# Patient Record
Sex: Female | Born: 1945 | Race: White | Hispanic: No | Marital: Married | Smoking: Never smoker
Health system: Southern US, Community
[De-identification: ages and names within clinical notes are randomized; demographics above are authoritative.]

## PROBLEM LIST (undated history)

## (undated) DIAGNOSIS — K754 Autoimmune hepatitis: Secondary | ICD-10-CM

## (undated) DIAGNOSIS — T7840XA Allergy, unspecified, initial encounter: Secondary | ICD-10-CM

## (undated) DIAGNOSIS — I422 Other hypertrophic cardiomyopathy: Secondary | ICD-10-CM

## (undated) DIAGNOSIS — J45909 Unspecified asthma, uncomplicated: Secondary | ICD-10-CM

## (undated) DIAGNOSIS — M1990 Unspecified osteoarthritis, unspecified site: Secondary | ICD-10-CM

## (undated) DIAGNOSIS — M8580 Other specified disorders of bone density and structure, unspecified site: Secondary | ICD-10-CM

## (undated) DIAGNOSIS — R5383 Other fatigue: Secondary | ICD-10-CM

## (undated) DIAGNOSIS — D329 Benign neoplasm of meninges, unspecified: Secondary | ICD-10-CM

## (undated) DIAGNOSIS — G43B0 Ophthalmoplegic migraine, not intractable: Secondary | ICD-10-CM

## (undated) DIAGNOSIS — I10 Essential (primary) hypertension: Secondary | ICD-10-CM

## (undated) HISTORY — DX: Unspecified asthma, uncomplicated: J45.909

## (undated) HISTORY — DX: Other specified disorders of bone density and structure, unspecified site: M85.80

## (undated) HISTORY — DX: Autoimmune hepatitis: K75.4

## (undated) HISTORY — DX: Benign neoplasm of meninges, unspecified: D32.9

## (undated) HISTORY — PX: CHOLECYSTECTOMY: SHX55

## (undated) HISTORY — DX: Unspecified osteoarthritis, unspecified site: M19.90

## (undated) HISTORY — DX: Other fatigue: R53.83

## (undated) HISTORY — PX: FRACTURE SURGERY: SHX138

## (undated) HISTORY — DX: Essential (primary) hypertension: I10

## (undated) HISTORY — PX: WRIST SURGERY: SHX841

## (undated) HISTORY — PX: ANKLE SURGERY: SHX546

## (undated) HISTORY — DX: Allergy, unspecified, initial encounter: T78.40XA

## (undated) HISTORY — DX: Ophthalmoplegic migraine, not intractable: G43.B0

## (undated) HISTORY — PX: BRAIN SURGERY: SHX531

## (undated) HISTORY — DX: Other hypertrophic cardiomyopathy: I42.2

---

## 2001-11-03 ENCOUNTER — Emergency Department (HOSPITAL_COMMUNITY): Admission: EM | Admit: 2001-11-03 | Discharge: 2001-11-03 | Payer: Self-pay | Admitting: Emergency Medicine

## 2001-11-03 ENCOUNTER — Encounter: Payer: Self-pay | Admitting: Emergency Medicine

## 2003-08-09 ENCOUNTER — Encounter: Admission: RE | Admit: 2003-08-09 | Discharge: 2003-08-09 | Payer: Self-pay | Admitting: Family Medicine

## 2005-02-13 ENCOUNTER — Ambulatory Visit: Payer: Self-pay | Admitting: Cardiology

## 2006-01-19 ENCOUNTER — Ambulatory Visit: Payer: Self-pay | Admitting: Cardiology

## 2006-09-09 ENCOUNTER — Encounter: Admission: RE | Admit: 2006-09-09 | Discharge: 2006-09-09 | Payer: Self-pay | Admitting: Otolaryngology

## 2008-02-17 ENCOUNTER — Ambulatory Visit: Payer: Self-pay | Admitting: Cardiology

## 2008-02-28 ENCOUNTER — Ambulatory Visit: Payer: Self-pay

## 2008-02-28 ENCOUNTER — Encounter: Payer: Self-pay | Admitting: Cardiology

## 2008-10-13 ENCOUNTER — Encounter: Payer: Self-pay | Admitting: Cardiology

## 2009-08-28 ENCOUNTER — Encounter: Payer: Self-pay | Admitting: Cardiology

## 2009-11-19 LAB — HM COLONOSCOPY

## 2010-02-20 ENCOUNTER — Ambulatory Visit: Payer: Self-pay | Admitting: Cardiology

## 2010-03-26 LAB — HM PAP SMEAR: HM Pap smear: NORMAL

## 2010-03-28 ENCOUNTER — Encounter
Admission: RE | Admit: 2010-03-28 | Discharge: 2010-03-28 | Payer: Self-pay | Source: Home / Self Care | Admitting: Sports Medicine

## 2010-05-21 NOTE — Assessment & Plan Note (Signed)
Summary: PER CHECK OUT/SF  Medications Added IPRATROPIUM BROMIDE 0.03 % SOLN (IPRATROPIUM BROMIDE) as directed ASTELIN 137 MCG/SPRAY SOLN (AZELASTINE HCL) as directed ASPIRIN 325 MG  TABS (ASPIRIN) 1 by mouth daily FOSAMAX 70 MG TABS (ALENDRONATE SODIUM) 1 by mouth weekly AZATHIOPRINE 50 MG TABS (AZATHIOPRINE) 2 by mouth daily ADVAIR DISKUS 100-50 MCG/DOSE AEPB (FLUTICASONE-SALMETEROL) UAD VITAMIN D 1000 UNIT TABS (CHOLECALCIFEROL) 1 by mouth daily ZETIA 10 MG TABS (EZETIMIBE) 1 by mouth daily SINGULAIR 10 MG TABS (MONTELUKAST SODIUM) 1 by mouth daily PRAVACHOL 40 MG TABS (PRAVASTATIN SODIUM) 1 by mouth daily CALCI-CHEW 1250 MG CHEW (CALCIUM CARBONATE) 2 by mouth daily      Allergies Added: ! PENICILLIN ! KEFLEX  Visit Type:  Follow-up Primary Provider:  Dr. Christell Constant  CC:  Hypertrophic CM.  History of Present Illness: The patient presents for 24 month followup of hypertrophic cardiomyopathy. This was noted in the past when she was sick and being diagnosed with hepatitis. The last echo in 2009 demonstrated no significant septal thickening though there was some mild systolic anterior motion of the mitral leaflet and nodularity of the papillary muscle or cords. Since that evaluation she's had no new cardiovascular symptoms. In particular she said no shortness of breath, PND or orthopnea. He has no palpitations, presyncope or syncope. She denies any chest pressure, neck or arm discomfort.  Current Medications (verified): 1)  Verapamil Hcl Cr 240 Mg Xr24h-Cap (Verapamil Hcl) .Marland Kitchen.. 1 By Mouth Daily 2)  Hydrochlorothiazide 25 Mg Tabs (Hydrochlorothiazide) .Marland Kitchen.. 1 By Mouth Daily 3)  Ipratropium Bromide 0.03 % Soln (Ipratropium Bromide) .... As Directed 4)  Astelin 137 Mcg/spray Soln (Azelastine Hcl) .... As Directed 5)  Aspirin 325 Mg  Tabs (Aspirin) .Marland Kitchen.. 1 By Mouth Daily 6)  Fosamax 70 Mg Tabs (Alendronate Sodium) .Marland Kitchen.. 1 By Mouth Weekly 7)  Azathioprine 50 Mg Tabs (Azathioprine) .... 2 By  Mouth Daily 8)  Advair Diskus 100-50 Mcg/dose Aepb (Fluticasone-Salmeterol) .... Uad 9)  Vitamin D 1000 Unit Tabs (Cholecalciferol) .Marland Kitchen.. 1 By Mouth Daily 10)  Zetia 10 Mg Tabs (Ezetimibe) .Marland Kitchen.. 1 By Mouth Daily 11)  Singulair 10 Mg Tabs (Montelukast Sodium) .Marland Kitchen.. 1 By Mouth Daily 12)  Pravachol 40 Mg Tabs (Pravastatin Sodium) .Marland Kitchen.. 1 By Mouth Daily 13)  Calci-Chew 1250 Mg Chew (Calcium Carbonate) .... 2 By Mouth Daily  Allergies (verified): 1)  ! Penicillin 2)  ! Keflex  Past History:  Past Medical History: Hypertrophic cardiomyopathy (echocardiogram 2009 with hyperdynamic LV.  The LV outflow tract gradient was 4.8 meters per second at rest.  There was systolic anterior motion of the mitral leaflet and mild MR), autoimmune hepatitis, asthma, hypertension, cholecystectomy, wrist surgery, ankle surgery, resection of meningioma 2009.   Review of Systems       As stated in the HPI and negative for all other systems.   Vital Signs:  Patient profile:   65 year old female Height:      67 inches Weight:      182 pounds BMI:     28.61 Pulse rate:   63 / minute Resp:     16 per minute BP sitting:   108 / 70  (right arm)  Vitals Entered By: Marrion Coy, CNA (February 20, 2010 9:29 AM)  Physical Exam  General:  Well developed, well nourished, in no acute distress. Head:  normocephalic and atraumatic Neck:  Neck supple, no JVD. No masses, thyromegaly or abnormal cervical nodes. Chest Wall:  no deformities or breast masses noted Lungs:  Clear bilaterally to auscultation and percussion. Heart:  Non-displaced PMI, chest non-tender; regular rate and rhythm, S1, S2 without murmurs, rubs or gallops. Carotid upstroke normal, no bruit. Normal abdominal aortic size, no bruits. Femorals normal pulses, no bruits. Pedals normal pulses. No edema, no varicosities. Abdomen:  Bowel sounds positive; abdomen soft and non-tender without masses, organomegaly, or hernias noted. No  hepatosplenomegaly. Msk:  Back normal, normal gait. Muscle strength and tone normal. Extremities:  No clubbing or cyanosis. Neurologic:  Alert and oriented x 3. Skin:  Intact without lesions or rashes. Cervical Nodes:  no significant adenopathy Psych:  Normal affect.   EKG  Procedure date:  02/20/2010  Findings:      Sinus rhythm, rate 63, nonspecific ST depression  Impression & Recommendations:  Problem # 1:  HYPERTROPHIC OBSTRUCTIVE CARDIOMYOPATHY (ICD-425.1) At this point there is no clinical evidence that this has progressed. No further cardiovascular testing is indicated.  She can follow as needed. Orders: EKG w/ Interpretation (93000)  Problem # 2:  HTN She will continue the Verapamil .  Patient Instructions: 1)  Your physician recommends that you schedule a follow-up appointment as needed 2)  Your physician recommends that you continue on your current medications as directed. Please refer to the Current Medication list given to you today. Prescriptions: VERAPAMIL HCL CR 240 MG XR24H-CAP (VERAPAMIL HCL) 1 by mouth daily  #30 x 11   Entered by:   Charolotte Capuchin, RN   Authorized by:   Rollene Rotunda, MD, [facility]   Signed by:   Charolotte Capuchin, RN on 02/20/2010   Method used:   Electronically to        [facility]. [address] [phone]* (retail)       [address] [place], [place]  [id number]       Ph: [phone]       Fax: [phone]   RxID:   [id number]

## 2010-07-06 ENCOUNTER — Encounter: Payer: Self-pay | Admitting: Family Medicine

## 2010-07-06 DIAGNOSIS — R5383 Other fatigue: Secondary | ICD-10-CM

## 2010-07-06 DIAGNOSIS — M8580 Other specified disorders of bone density and structure, unspecified site: Secondary | ICD-10-CM

## 2010-07-06 DIAGNOSIS — G43109 Migraine with aura, not intractable, without status migrainosus: Secondary | ICD-10-CM

## 2010-07-06 DIAGNOSIS — J302 Other seasonal allergic rhinitis: Secondary | ICD-10-CM

## 2010-07-06 DIAGNOSIS — Z9049 Acquired absence of other specified parts of digestive tract: Secondary | ICD-10-CM

## 2010-07-06 DIAGNOSIS — K754 Autoimmune hepatitis: Secondary | ICD-10-CM | POA: Insufficient documentation

## 2010-07-06 DIAGNOSIS — D329 Benign neoplasm of meninges, unspecified: Secondary | ICD-10-CM | POA: Insufficient documentation

## 2010-07-06 DIAGNOSIS — I10 Essential (primary) hypertension: Secondary | ICD-10-CM | POA: Insufficient documentation

## 2010-07-06 DIAGNOSIS — J45909 Unspecified asthma, uncomplicated: Secondary | ICD-10-CM | POA: Insufficient documentation

## 2010-08-12 ENCOUNTER — Other Ambulatory Visit: Payer: Self-pay | Admitting: Cardiology

## 2010-09-03 NOTE — Assessment & Plan Note (Signed)
[facility]                            CARDIOLOGY OFFICE NOTE   Veronica Flores, Veronica Flores                       MRN:          [id number]  DATE:02/17/2008                            DOB:          1946-04-06    PRIMARY CARE PHYSICIAN:  [facility]   REASON FOR PRESENTATION:  Evaluate the patient with left ventricular  hypertrophy and mild hypertrophic cardiomyopathy.   HISTORY OF PRESENT ILLNESS:  The patient is now 65 years old.  It has  been 2 years since I last saw her.  She has had no new cardiovascular  symptoms since I last saw her.  She did have diagnosis of meningioma and  actually had to have this resected.  She is recovering from this  surgery.  She actually had no cardiovascular problems during this  therapy.  She continues to have some blurred vision, is actually wearing  an eye patch a day.  Because of all of this, she has not been  particularly active over the last several months.  However, with her  chores of daily living, she denies any chest discomfort, neck or arm  discomfort.  She is not having any shortness of breath, PND, or  orthopnea.  She has not had any palpitation, presyncope, or syncope.   PAST MEDICAL HISTORY:  Hypertrophic cardiomyopathy (echocardiogram 2005  with hyperdynamic LV.  The LV outflow tract gradient was 4.8 meters per  second at rest.  There was systolic anterior motion of the mitral  leaflet and mild MR), autoimmune hepatitis, asthma, hypertension,  cholecystectomy, wrist surgery, ankle surgery, resection of meningioma  2009.   ALLERGIES:  PENICILLIN, KEFLEX.   MEDICATIONS:  1. Ipratropium spray.  2. Advair.  3. Nasonex.  4. Fosamax.  5. Aspirin 81 mg daily.  6. Verapamil 240 mg daily.  7. Hydrochlorothiazide 25 mg daily.  8. Singulair 10 mg daily.  9. Zetia 10 mg daily.  10.Pravastatin 40 mg daily.  11.Vitamin D.   REVIEW OF SYSTEMS:  As stated in the HPI and otherwise negative for  other systems.   PHYSICAL EXAMINATION:  GENERAL:  The patient is pleasant in no distress.  VITAL SIGNS:  Blood pressure 134/76, heart rate 65 and regular, weight  188 pounds, body mass index 30.  HEENT:  Eyes unremarkable.  Pupils equal, round, and reactive to light.  Fundi not visualized.  Oral mucosa unremarkable.  NECK:  No jugular venous distention at 45 degrees.  Carotid upstroke  brisk and symmetrical.  No bruits.  No thyromegaly.  LYMPHATICS:  No  cervical, axillary, or inguinal adenopathy.  LUNGS:  Clear to auscultation bilaterally.  BACK:  No costovertebral angle tenderness.  CHEST:  Unremarkable.  HEART:  PMI not displaced or sustained.  S1, S2 within normal limits.  No S3, no S4.  No clicks, no rubs.  2/6 apical systolic murmur radiating  slightly at the aortic outflow tract, increasing slightly with strain  phase of Valsalva, no diastolic murmurs heard.  ABDOMEN:  Flat, positive  bowel sounds normal in frequency and pitch.  No bruits, no rebound, no  guarding.  No midline pulsatile mass.  No organomegaly.  SKIN:  No rashes, no nodules.  EXTREMITIES:  2+ pulse, no edema.   1. Hypertrophic cardiomyopathy.  I do not suspect that this has      increased substantially.  However, it has been 5 years since the      last echocardiogram.  She did have some systolic anterior motion of      the mitral leaflet and regurgitation.  She needs further evaluation      of this.  In the meantime, she will continue with her medications      as listed.  We discussed the symptoms that could develop should she      have any worsening of this.  2. Followup.  I would like to see her back in 2 years unless she has      further problems.     Rollene Rotunda, MD, [facility]  Electronically Signed    JH/MedQ  DD: 02/17/2008  DT: 02/17/2008  Job #: [id number]   cc:   [facility]

## 2010-09-06 NOTE — Assessment & Plan Note (Signed)
[facility]                              CARDIOLOGY OFFICE NOTE   Veronica Flores, Veronica Flores                       MRN:          [id number]  DATE:01/19/2006                            DOB:          1945-07-22    PRIMARY:  Dr. Vernon Prey at [facility].   REASON FOR PRESENTATION:  Evaluate the patient for left ventricular  hypertrophy and mild hypertrophic cardiomyopathy.   HISTORY OF PRESENT ILLNESS:  The patient is now 65 years old.  She returns  for yearly followup.  She has done quite well.  She has no shortness of  breath other than occasional mild bouts of asthma.  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  She has no PND or orthopnea.  She has  no palpitations, pre-syncope, or syncope.  She has no chest pain.   PAST MEDICAL HISTORY:  Autoimmune hepatitis, mild hypertrophic  cardiomyopathy, asthma, hypertension, cholecystectomy, wrist surgery, ankle  surgery.   ALLERGIES:  PENICILLIN and KEFLEX.   MEDICATIONS:  Ipratropium bromide, Nasonex, Advair, Singulair, Albuterol,  hydrochlorothiazide 25 mg q. day, calcium, verapamil 240 mg, __________ ,  Actonel, aspirin 81 mg, Zetia 10 mg a day, pravastatin 20 mg a day.   REVIEW OF SYSTEMS:  As stated in the HPI and otherwise negative for other  symptoms.   PHYSICAL EXAMINATION:  The patient is in no distress.  Weight 196 pounds, body mass index 31, heart rate 52, blood pressure 138/74.  LUNGS:  Clear to auscultation bilaterally.  BACK:  No costovertebral angle tenderness.  CHEST:  Unremarkable.  HEART:  PMI not displaced or sustained.  S1 and S2 within normal limits.  No  S3.  No S4.  No murmurs appreciated today.  No diastolic murmurs.  ABDOMEN:  Flat, positive bowel sounds, normal frequency and pitch.  No  bruits, rebound, or guarding.  No midline pulsatile mass or organomegaly.  SKIN:  No rashes,, benign.  EXTREMITIES:  With 2+ pulses.  No edema.   ASSESSMENT AND PLAN:  1.  Hypertrophic cardiomyopathy/hypertensive left ventricular hypertrophy.      The patient is having no symptoms related to this.  She understands      salt and fluid restriction and blood pressure control.  No further      cardiovascular testing is suggested.  2. Obesity.  She understands the need to lose weight with diet and      exercise.  3. Followup.  I will see her back in about 2 years or sooner if needed.            ______________________________  Rollene Rotunda, MD, [facility]     JH/MedQ  DD:  01/19/2006  DT:  01/19/2006  Job #:  [id number]   cc:   Ernestina Penna, M.D.

## 2010-10-24 ENCOUNTER — Other Ambulatory Visit: Payer: Self-pay | Admitting: Cardiology

## 2010-10-29 ENCOUNTER — Encounter: Payer: Self-pay | Admitting: Family Medicine

## 2010-11-17 ENCOUNTER — Inpatient Hospital Stay (INDEPENDENT_AMBULATORY_CARE_PROVIDER_SITE_OTHER)
Admission: RE | Admit: 2010-11-17 | Discharge: 2010-11-17 | Disposition: A | Discharge status: Home / Self Care | Payer: BC Managed Care – PPO | Source: Ambulatory Visit | Attending: Family Medicine | Admitting: Family Medicine

## 2010-11-17 ENCOUNTER — Inpatient Hospital Stay (HOSPITAL_COMMUNITY)
Admission: EM | Admit: 2010-11-17 | Discharge: 2010-11-20 | DRG: 320 | Disposition: A | Discharge status: Home / Self Care | Payer: BC Managed Care – PPO | Attending: Internal Medicine | Admitting: Internal Medicine

## 2010-11-17 ENCOUNTER — Emergency Department (HOSPITAL_COMMUNITY): Payer: BC Managed Care – PPO

## 2010-11-17 DIAGNOSIS — N390 Urinary tract infection, site not specified: Principal | ICD-10-CM | POA: Diagnosis present

## 2010-11-17 DIAGNOSIS — Z7982 Long term (current) use of aspirin: Secondary | ICD-10-CM

## 2010-11-17 DIAGNOSIS — I10 Essential (primary) hypertension: Secondary | ICD-10-CM | POA: Diagnosis present

## 2010-11-17 DIAGNOSIS — I959 Hypotension, unspecified: Secondary | ICD-10-CM | POA: Diagnosis present

## 2010-11-17 DIAGNOSIS — A498 Other bacterial infections of unspecified site: Secondary | ICD-10-CM | POA: Diagnosis present

## 2010-11-17 DIAGNOSIS — E785 Hyperlipidemia, unspecified: Secondary | ICD-10-CM | POA: Diagnosis present

## 2010-11-17 DIAGNOSIS — K754 Autoimmune hepatitis: Secondary | ICD-10-CM | POA: Diagnosis present

## 2010-11-17 DIAGNOSIS — J45909 Unspecified asthma, uncomplicated: Secondary | ICD-10-CM | POA: Diagnosis present

## 2010-11-17 DIAGNOSIS — R079 Chest pain, unspecified: Secondary | ICD-10-CM

## 2010-11-17 LAB — URINALYSIS, ROUTINE W REFLEX MICROSCOPIC
Glucose, UA: NEGATIVE mg/dL
Hgb urine dipstick: NEGATIVE
Protein, ur: 30 mg/dL — AB
pH: 5.5 (ref 5.0–8.0)

## 2010-11-17 LAB — CBC
HCT: 41.3 % (ref 36.0–46.0)
MCHC: 35.4 g/dL (ref 30.0–36.0)
Platelets: 227 10*3/uL (ref 150–400)
RDW: 14.3 % (ref 11.5–15.5)

## 2010-11-17 LAB — COMPREHENSIVE METABOLIC PANEL
AST: 25 U/L (ref 0–37)
Albumin: 3.6 g/dL (ref 3.5–5.2)
CO2: 22 mEq/L (ref 19–32)
Creatinine, Ser: 0.65 mg/dL (ref 0.50–1.10)
Glucose, Bld: 104 mg/dL — ABNORMAL HIGH (ref 70–99)
Sodium: 136 mEq/L (ref 135–145)
Total Bilirubin: 0.7 mg/dL (ref 0.3–1.2)
Total Protein: 6.6 g/dL (ref 6.0–8.3)

## 2010-11-17 LAB — DIFFERENTIAL
Basophils Absolute: 0.1 10*3/uL (ref 0.0–0.1)
Eosinophils Absolute: 0 10*3/uL (ref 0.0–0.7)
Lymphs Abs: 0.6 10*3/uL — ABNORMAL LOW (ref 0.7–4.0)
Monocytes Absolute: 0.5 10*3/uL (ref 0.1–1.0)

## 2010-11-17 LAB — PROTIME-INR
INR: 0.98 (ref 0.00–1.49)
Prothrombin Time: 13.2 seconds (ref 11.6–15.2)

## 2010-11-17 LAB — URINE MICROSCOPIC-ADD ON

## 2010-11-17 LAB — LACTIC ACID, PLASMA: Lactic Acid, Venous: 1.5 mmol/L (ref 0.5–2.2)

## 2010-11-17 LAB — CK TOTAL AND CKMB (NOT AT ARMC): Total CK: 47 U/L (ref 7–177)

## 2010-11-17 LAB — APTT: aPTT: 31 seconds (ref 24–37)

## 2010-11-18 LAB — CBC
HCT: 37.3 % (ref 36.0–46.0)
Hemoglobin: 12.3 g/dL (ref 12.0–15.0)
MCH: 27.6 pg (ref 26.0–34.0)
MCHC: 33 g/dL (ref 30.0–36.0)
MCV: 83.6 fL (ref 78.0–100.0)
RDW: 14.4 % (ref 11.5–15.5)

## 2010-11-18 LAB — COMPREHENSIVE METABOLIC PANEL
ALT: 10 U/L (ref 0–35)
Alkaline Phosphatase: 40 U/L (ref 39–117)
BUN: 7 mg/dL (ref 6–23)
CO2: 24 mEq/L (ref 19–32)
Chloride: 112 mEq/L (ref 96–112)
GFR calc Af Amer: >60 mL/min (ref >60)
Glucose, Bld: 88 mg/dL (ref 70–99)
Potassium: 3.7 mEq/L (ref 3.5–5.1)
Sodium: 142 mEq/L (ref 135–145)
Total Bilirubin: 0.3 mg/dL (ref 0.3–1.2)
Total Protein: 5.5 g/dL — ABNORMAL LOW (ref 6.0–8.3)

## 2010-11-18 LAB — TSH: TSH: 3.344 u[IU]/mL (ref 0.350–4.500)

## 2010-11-18 LAB — CARDIAC PANEL(CRET KIN+CKTOT+MB+TROPI)
CK, MB: 1.4 ng/mL (ref 0.3–4.0)
CK, MB: 1.5 ng/mL (ref 0.3–4.0)
Total CK: 46 U/L (ref 7–177)

## 2010-11-19 LAB — BASIC METABOLIC PANEL
Chloride: 108 mEq/L (ref 96–112)
GFR calc Af Amer: >60 mL/min (ref >60)
GFR calc non Af Amer: >60 mL/min (ref >60)
Potassium: 4 mEq/L (ref 3.5–5.1)
Sodium: 140 mEq/L (ref 135–145)

## 2010-11-19 LAB — CBC
Hemoglobin: 12.4 g/dL (ref 12.0–15.0)
MCHC: 33.3 g/dL (ref 30.0–36.0)
RDW: 14.8 % (ref 11.5–15.5)
WBC: 6.2 10*3/uL (ref 4.0–10.5)

## 2010-11-19 LAB — URINE CULTURE: Culture  Setup Time: 201207300033

## 2010-11-20 LAB — DIFFERENTIAL
Basophils Absolute: 0.4 10*3/uL — ABNORMAL HIGH (ref 0.0–0.1)
Lymphocytes Relative: 54 % — ABNORMAL HIGH (ref 12–46)
Monocytes Relative: 11 % (ref 3–12)
Neutrophils Relative %: 30 % — ABNORMAL LOW (ref 43–77)

## 2010-11-20 LAB — BASIC METABOLIC PANEL
CO2: 27 mEq/L (ref 19–32)
Chloride: 104 mEq/L (ref 96–112)
GFR calc non Af Amer: >60 mL/min (ref >60)
Glucose, Bld: 78 mg/dL (ref 70–99)
Potassium: 3.6 mEq/L (ref 3.5–5.1)
Sodium: 141 mEq/L (ref 135–145)

## 2010-11-20 LAB — CBC
Hemoglobin: 13.4 g/dL (ref 12.0–15.0)
MCHC: 33.4 g/dL (ref 30.0–36.0)
RBC: 4.79 MIL/uL (ref 3.87–5.11)
WBC: 8.1 10*3/uL (ref 4.0–10.5)

## 2010-11-24 LAB — CULTURE, BLOOD (ROUTINE X 2)
Culture  Setup Time: 201207300022
Culture: NO GROWTH

## 2010-11-24 NOTE — Discharge Summary (Signed)
  NAMEDELYNDA, SEPULVEDA                ACCOUNT NO.:  [id number]  MEDICAL RECORD NO.:  [id number]  LOCATION:  4714                         FACILITY:  [facility]  PHYSICIAN:  Conley Canal, MD      DATE OF BIRTH:  07-14-1945  DATE OF ADMISSION:  11/17/2010 DATE OF DISCHARGE:  11/20/2010                        DISCHARGE SUMMARY - REFERRING   PRIMARY CARE PHYSICIAN:  Newman Nip, MD with [facility].  DISCHARGE DIAGNOSES: 1. Hypotension most likely multifactorial etiology including UTI and     hypotensive. 2. E coli urinary tract infection. 3. Bronchial asthma. 4. Autoimmune hepatitis. 5. Chronic immunosuppressive therapy. 6. Hypertension controlled. 7. Hyperlipidemia.  DISCHARGE MEDICATIONS: 1. Ciprofloxacin 500 mg twice daily for 5 more days. 2. Advair 100/50 one puff twice daily. 3. Aspirin 325 mg daily. 4. Azathioprine 100 mg daily. 5. Calcium D 1 tablet twice daily. 6. Hydrochlorothiazide 25 mg daily. 7. Atrovent inhalations twice daily. 8. Pravastatin 40 mg at bedtime. 9. Singulair 10 mg at bedtime. 10.Artificial tears 1 drop in right eye every 4 hours. 11.Verapamil SR 250 mg daily. 12.Vitamin D3 1000 units at bedtime.  PROCEDURES PERFORMED:  Chest x-ray on November 17, 2010 showed no active disease.  HOSPITAL COURSE:  Ms. Clawson is a pleasant 65 year old female who came in with complaints of fever and low blood pressure.  She had tooth extraction a week prior to admission and there was concern for bacteremia.  The patient was found to have E coli UTI.  Her workup so far has been significant for negative blood cultures x2.  Chest x-ray unremarkable and urine culture growing E coli sensitive to ampicillin, cefazolin, ceftriaxone, ciprofloxacin, gentamicin, Levaquin, nitrofurantoin, tobramycin, but resistant to Bactrim.  The patient was initially placed on broad-spectrum antibiotics including vancomycin and Zosyn and she did very well.  She has stabilized  today and white count is 8.1, hemoglobin 13.4, hematocrit 40.1, platelet count 205. Electrolytes unremarkable except potassium of 3.6.  She feels better.  She will be discharged on ciprofloxacin for 4 more days and she should follow with Dr. Tanya Nones in the next 1 week.  Please change Zosyn to Primaxin on the hospital course above.  The time spent for this discharge preparation is less than 30 minutes.     Conley Canal, MD     SR/MEDQ  D:  11/20/2010  T:  11/20/2010  Job:  [id number]  cc:   Newman Nip, MD  Electronically Signed by Conley Canal  on 11/24/2010 05:45:58 PM

## 2011-02-01 NOTE — H&P (Signed)
Veronica Flores, TKACH NO.:  [id number]  MEDICAL RECORD NO.:  [id number]  LOCATION:                                 FACILITY:  PHYSICIAN:  Veronica Flores, M.D.      DATE OF BIRTH:  1946/02/08  DATE OF ADMISSION:  11/17/2010 DATE OF DISCHARGE:                             HISTORY & PHYSICAL   PRIMARY CARE PHYSICIAN:  Newman Nip, MD at [facility].  PRESENTING COMPLAINT:  Fever and low Flores pressure.  HISTORY OF PRESENT ILLNESS:  The patient is a 65 year old female with history of autoimmune hepatitis which is in remission.  She is taking immunosuppressant Imuran.  The patient apparently had a dental extraction about a week ago.  She was given clindamycin which she took about for about 6 days.  Following that, she started having fever about 3 days ago after she finished the antibiotics.  That was followed by spike today as high as 101 at home.  She was worried about it.  Her family also was worried and they decided to bring her in for further management.  She initially went to Urgent Care and was transferred here to the ED.  She had some mild chest discomfort along the line but no shortness of breath.  No cough.  No fever.  She also had some chills at some point and per daughter her Flores pressure dropped systolic into the 80s when she was having the fever.  The patient was found to be fine over here in the ED with regard to her Flores pressure.  No other complaints at this point.  PAST MEDICAL HISTORY: 1. Extensive including autoimmune hepatitis.  She is currently in     remission.  She is on Imuran and azathioprine. 2. She had recent dental extraction. 3. History of meningioma of the brain status post previous brain     surgery. 4. History of cardiomyopathy secondary to LVH.  She was seen at     [facility], Dr. Antoine Poche and apparently has been     discharged. 5. Hyperlipidemia. 6. History of asthma. 7. Status post  cholecystectomy. 8. Status post knee arthroplasty. 9. Status post wrist surgery.  ALLERGIES: 1. CEPHALOSPORINS. 2. PENICILLIN.  CURRENT MEDICATIONS:  Ipratropium bromide, aspirin, verapamil, azathioprine, hydrochlorothiazide, Advil, Astelin, Singulair, and Pravastatin.  Also calcium supplement.  SOCIAL HISTORY:  The patient lives in [place] with her husband.  No tobacco, alcohol, or IV drug use.  She is pretty active.  FAMILY HISTORY:  Denied any significant family history of cancer.  REVIEW OF SYSTEMS:  All systems reviewed currently are negative except per HPI.  PHYSICAL EXAMINATION:  VITAL SIGNS:  Temperature 101.1, Flores pressure 108/86, her pulse was 85, respiratory 18, sat is 99% on room air. GENERAL:  She is awake, alert, oriented, pleasant woman who is in no acute distress. HEENT:  PERRL.  EOMI.  No pallor.  No jaundice.  No rhinorrhea. NECK:  Supple.  No JVD.  No lymphadenopathy. RESPIRATORY:  She has good air entry bilaterally.  No wheezes.  No rales.  No crackles. CARDIOVASCULAR:  S1, S2.  No audible murmur. ABDOMEN:  Soft, full, nontender with  positive bowel sounds. EXTREMITIES:  No edema, cyanosis, or clubbing. SKIN:  No rashes or ulcers. MUSCULOSKELETAL:  No joint swelling or tenderness.  LABORATORY DATA:  Her white count is 3.0, but normal differential. Hemoglobin 14.6, platelet of 227.  Urinalysis showed orange urine with small bilirubin, small ketones, mild proteinuria.  Urobilinogen is negative.  Nitrite is positive.  Leukocyte esterase positive, Urine wbc 3-6 with rare bacteria.  Sodium is 136, potassium 3.2, chloride 103, CO2 of 22, glucose 104, BUN 10, creatinine 0.65, calcium 8.9.  The rest of the LFTs are within normal.  Chest x-ray showed no active disease, total CK 47 with an MB of 1.3, lactic acid 1.5, troponin is negative.  PT 13.2, INR 0.98.  ASSESSMENT:  This is a 65 year old female presenting with febrile illness.  More than likely, the  source of her fevers are the urinary tract infection.  She is on immunosuppressant therapy and recently had her teeth extracted which gives rise to suspicion of possible bacteremia from that event.  However, she was on antibiotics for 6 days.  Although we cannot completely exclude it, but this is less likely.  More than likely her acute febrile illnesses is secondary to her urinary tract infection.  PLAN: 1. Acute febrile illness.  The patient will be admitted to a monitored     bed.  Will get Flores cultures and urine cultures.  Her chest x-ray     is clear so I doubt this is pneumonia.  However, more than likely     this is related to UTI plus or minus bacteremia due to her teeth     extraction.  I will therefore cover her with IV vancomycin and     Primaxin which should cover most of the pathogens.  Depending on     the culture results and sensitivity, we will narrow down her     antibiotic coverage.  She is allergic to PENICILLIN and     CEPHALOSPORINS, so we have to use middle ground. 2. Asthma.  The patient is not having any breathing problem at this     point.  We will continue with the Singulair that she takes at home     and also give her empiric nebulizers in the hospital. 3. Autoimmune hepatitis.  The patient will continue her home     medication as much as possible. 4. Possible UTI.  Again, continue with treatment as above. 5. Leukopenia more than likely from her immunosuppressive therapy.  We     will watch her white count closely. 6. Hypokalemia.  I will replete her potassium here and also follow it     closely.  Otherwise, the rest of her medical problems seems to be     stable and we will follow them closely in the hospital.     Veronica Flores, M.D.     LG/MEDQ  D:  11/17/2010  T:  11/17/2010  Job:  [id number]  Electronically Signed by Veronica Flores M.D. on 02/01/2011 02:48:52 PM

## 2011-02-06 DIAGNOSIS — Z86011 Personal history of benign neoplasm of the brain: Secondary | ICD-10-CM | POA: Insufficient documentation

## 2011-02-19 ENCOUNTER — Other Ambulatory Visit: Payer: Self-pay | Admitting: *Deleted

## 2011-02-19 MED ORDER — VERAPAMIL HCL 240 MG (CO) PO TB24: 240.0000 mg | ORAL_TABLET | Freq: Every day | ORAL | Status: DC

## 2011-04-29 DIAGNOSIS — Z86011 Personal history of benign neoplasm of the brain: Secondary | ICD-10-CM | POA: Diagnosis not present

## 2011-04-29 DIAGNOSIS — H269 Unspecified cataract: Secondary | ICD-10-CM | POA: Diagnosis not present

## 2011-04-29 DIAGNOSIS — D320 Benign neoplasm of cerebral meninges: Secondary | ICD-10-CM | POA: Diagnosis not present

## 2011-04-29 DIAGNOSIS — H4900 Third [oculomotor] nerve palsy, unspecified eye: Secondary | ICD-10-CM | POA: Diagnosis not present

## 2011-05-27 DIAGNOSIS — C751 Malignant neoplasm of pituitary gland: Secondary | ICD-10-CM | POA: Diagnosis not present

## 2011-05-27 DIAGNOSIS — H52209 Unspecified astigmatism, unspecified eye: Secondary | ICD-10-CM | POA: Diagnosis not present

## 2011-06-06 DIAGNOSIS — Z0000 Encounter for general adult medical examination without abnormal findings: Secondary | ICD-10-CM | POA: Diagnosis not present

## 2011-06-06 DIAGNOSIS — I422 Other hypertrophic cardiomyopathy: Secondary | ICD-10-CM | POA: Diagnosis not present

## 2011-06-06 DIAGNOSIS — R5381 Other malaise: Secondary | ICD-10-CM | POA: Diagnosis not present

## 2011-06-06 DIAGNOSIS — E559 Vitamin D deficiency, unspecified: Secondary | ICD-10-CM | POA: Diagnosis not present

## 2011-07-08 DIAGNOSIS — Z1231 Encounter for screening mammogram for malignant neoplasm of breast: Secondary | ICD-10-CM | POA: Diagnosis not present

## 2011-08-11 DIAGNOSIS — E785 Hyperlipidemia, unspecified: Secondary | ICD-10-CM | POA: Diagnosis not present

## 2011-09-17 DIAGNOSIS — D126 Benign neoplasm of colon, unspecified: Secondary | ICD-10-CM | POA: Diagnosis not present

## 2011-09-17 DIAGNOSIS — R6889 Other general symptoms and signs: Secondary | ICD-10-CM | POA: Diagnosis not present

## 2011-09-17 DIAGNOSIS — K739 Chronic hepatitis, unspecified: Secondary | ICD-10-CM | POA: Diagnosis not present

## 2011-10-24 DIAGNOSIS — K739 Chronic hepatitis, unspecified: Secondary | ICD-10-CM | POA: Diagnosis not present

## 2011-11-18 ENCOUNTER — Observation Stay (HOSPITAL_COMMUNITY): Payer: Medicare Other

## 2011-11-18 ENCOUNTER — Observation Stay (HOSPITAL_COMMUNITY)
Admission: EM | Admit: 2011-11-18 | Discharge: 2011-11-18 | Disposition: A | Discharge status: Home / Self Care | Payer: Medicare Other | Attending: Emergency Medicine | Admitting: Emergency Medicine

## 2011-11-18 ENCOUNTER — Encounter (HOSPITAL_COMMUNITY): Payer: Self-pay

## 2011-11-18 DIAGNOSIS — R4789 Other speech disturbances: Secondary | ICD-10-CM | POA: Diagnosis not present

## 2011-11-18 DIAGNOSIS — J45909 Unspecified asthma, uncomplicated: Secondary | ICD-10-CM | POA: Insufficient documentation

## 2011-11-18 DIAGNOSIS — I422 Other hypertrophic cardiomyopathy: Secondary | ICD-10-CM | POA: Insufficient documentation

## 2011-11-18 DIAGNOSIS — D329 Benign neoplasm of meninges, unspecified: Secondary | ICD-10-CM

## 2011-11-18 DIAGNOSIS — R4701 Aphasia: Secondary | ICD-10-CM

## 2011-11-18 DIAGNOSIS — M899 Disorder of bone, unspecified: Secondary | ICD-10-CM | POA: Insufficient documentation

## 2011-11-18 DIAGNOSIS — R51 Headache: Secondary | ICD-10-CM | POA: Diagnosis not present

## 2011-11-18 DIAGNOSIS — G43809 Other migraine, not intractable, without status migrainosus: Secondary | ICD-10-CM | POA: Diagnosis not present

## 2011-11-18 DIAGNOSIS — I10 Essential (primary) hypertension: Secondary | ICD-10-CM | POA: Diagnosis not present

## 2011-11-18 DIAGNOSIS — G459 Transient cerebral ischemic attack, unspecified: Secondary | ICD-10-CM | POA: Diagnosis not present

## 2011-11-18 DIAGNOSIS — I517 Cardiomegaly: Secondary | ICD-10-CM

## 2011-11-18 DIAGNOSIS — R220 Localized swelling, mass and lump, head: Secondary | ICD-10-CM | POA: Diagnosis not present

## 2011-11-18 DIAGNOSIS — G43909 Migraine, unspecified, not intractable, without status migrainosus: Secondary | ICD-10-CM | POA: Diagnosis not present

## 2011-11-18 DIAGNOSIS — D320 Benign neoplasm of cerebral meninges: Secondary | ICD-10-CM | POA: Diagnosis not present

## 2011-11-18 DIAGNOSIS — R221 Localized swelling, mass and lump, neck: Secondary | ICD-10-CM | POA: Diagnosis not present

## 2011-11-18 LAB — CBC
HCT: 47.9 % — ABNORMAL HIGH (ref 36.0–46.0)
Hemoglobin: 16.5 g/dL — ABNORMAL HIGH (ref 12.0–15.0)
MCHC: 34.4 g/dL (ref 30.0–36.0)
MCV: 87.2 fL (ref 78.0–100.0)

## 2011-11-18 LAB — COMPREHENSIVE METABOLIC PANEL
Alkaline Phosphatase: 66 U/L (ref 39–117)
BUN: 10 mg/dL (ref 6–23)
Creatinine, Ser: 0.63 mg/dL (ref 0.50–1.10)
GFR calc Af Amer: >90 mL/min (ref >90)
Glucose, Bld: 78 mg/dL (ref 70–99)
Potassium: 3.7 mEq/L (ref 3.5–5.1)
Total Bilirubin: 0.6 mg/dL (ref 0.3–1.2)
Total Protein: 8.2 g/dL (ref 6.0–8.3)

## 2011-11-18 LAB — PROTIME-INR
INR: 1.06 (ref 0.00–1.49)
Prothrombin Time: 14 seconds (ref 11.6–15.2)

## 2011-11-18 LAB — URINALYSIS, ROUTINE W REFLEX MICROSCOPIC
Glucose, UA: NEGATIVE mg/dL
Hgb urine dipstick: NEGATIVE
Ketones, ur: NEGATIVE mg/dL
Protein, ur: NEGATIVE mg/dL

## 2011-11-18 LAB — HEMOGLOBIN A1C
Hgb A1c MFr Bld: 5.5 % (ref <5.7)
Mean Plasma Glucose: 111 mg/dL (ref <117)

## 2011-11-18 LAB — POCT I-STAT, CHEM 8
BUN: 11 mg/dL (ref 6–23)
Creatinine, Ser: 0.9 mg/dL (ref 0.50–1.10)
Glucose, Bld: 86 mg/dL (ref 70–99)
Hemoglobin: 16.3 g/dL — ABNORMAL HIGH (ref 12.0–15.0)
TCO2: 23 mmol/L (ref 0–100)

## 2011-11-18 LAB — LIPID PANEL
Total CHOL/HDL Ratio: 3.3 RATIO
VLDL: 21 mg/dL (ref 0–40)

## 2011-11-18 MED ORDER — SODIUM CHLORIDE 0.9 % IV SOLN: 1000.0000 mL | INTRAVENOUS | Status: DC

## 2011-11-18 MED ORDER — KETOROLAC TROMETHAMINE 30 MG/ML IJ SOLN: 30.0000 mg | Freq: Once | INTRAMUSCULAR | Status: DC

## 2011-11-18 NOTE — ED Notes (Signed)
[facility] tray ordered spoke with Tabitha 

## 2011-11-18 NOTE — ED Notes (Signed)
Hx of ocular migraine and today had similar symptoms but also had problems with words and sts now resolved.

## 2011-11-18 NOTE — ED Notes (Signed)
Pt sts that numbneess around mouth preseent also, usually occurs with her potassium istat ordered

## 2011-11-18 NOTE — ED Provider Notes (Signed)
Pt seen and examined by me in CDU. Pt with an episode of aphasia today. 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 Pt was sent here for stroke eval. Pt placed in CDU under TIA protocol ater her CT came back normal and she no longer was found to be having any symptoms.   Pt is currently in NAD, PERRLA, Neck supple. Extraocular movements intact. Lungs clear to auscultation, regular HR and rhythm. Abdomen soft, non tender. CN 2-12 intact. Equal grip strength bilat, No pronator drift, normal speech and coordination.   Will monitor while labs and MRI/echo pending.   Results for orders placed during the hospital encounter of 11/18/11  POCT I-STAT, CHEM 8      Component Value Range   Sodium 143  135 - 145 mEq/L   Potassium 4.0  3.5 - 5.1 mEq/L   Chloride 108  96 - 112 mEq/L   BUN 11  6 - 23 mg/dL   Creatinine, Ser 1.61  0.50 - 1.10 mg/dL   Glucose, Bld 86  70 - 99 mg/dL   Calcium, Ion 0.96  0.45 - 1.30 mmol/L   TCO2 23  0 - 100 mmol/L   Hemoglobin 16.3 (*) 12.0 - 15.0 g/dL   HCT 40.9 (*) 81.1 - 91.4 %  CBC      Component Value Range   WBC 6.9  4.0 - 10.5 K/uL   RBC 5.49 (*) 3.87 - 5.11 MIL/uL   Hemoglobin 16.5 (*) 12.0 - 15.0 g/dL   HCT 78.2 (*) 95.6 - 21.3 %   MCV 87.2  78.0 - 100.0 fL   MCH 30.1  26.0 - 34.0 pg   MCHC 34.4  30.0 - 36.0 g/dL   RDW 08.6  57.8 - 46.9 %   Platelets 292  150 - 400 K/uL  COMPREHENSIVE METABOLIC PANEL      Component Value Range   Sodium 145  135 - 145 mEq/L   Potassium 3.7  3.5 - 5.1 mEq/L   Chloride 109  96 - 112 mEq/L   CO2 22  19 - 32 mEq/L   Glucose, Bld 78  70 - 99 mg/dL   BUN 10  6 - 23 mg/dL   Creatinine, Ser 6.29  0.50 - 1.10 mg/dL   Calcium 52.8  8.4 - 41.3 mg/dL   Total Protein 8.2  6.0 - 8.3 g/dL   Albumin 5.0  3.5 - 5.2 g/dL   AST 43 (*) 0  - 37 U/L   ALT 39 (*) 0 - 35 U/L   Alkaline Phosphatase 66  39 - 117 U/L   Total Bilirubin 0.6  0.3 - 1.2 mg/dL   GFR calc non Af Amer >90  >90 mL/min   GFR calc Af Amer >90  >90 mL/min  PROTIME-INR      Component Value Range   Prothrombin Time 14.0  11.6 - 15.2 seconds   INR 1.06  0.00 - 1.49  APTT      Component Value Range   aPTT 31  24 - 37 seconds  URINALYSIS, ROUTINE W REFLEX MICROSCOPIC      Component Value Range   Color, Urine YELLOW  YELLOW   APPearance CLEAR  CLEAR   Specific Gravity, Urine 1.007  1.005 - 1.030   pH 8.0  5.0 - 8.0   Glucose, UA NEGATIVE  NEGATIVE mg/dL   Hgb urine dipstick NEGATIVE  NEGATIVE   Bilirubin Urine NEGATIVE  NEGATIVE   Ketones, ur NEGATIVE  NEGATIVE mg/dL   Protein, ur NEGATIVE  NEGATIVE mg/dL   Urobilinogen, UA 0.2  0.0 - 1.0 mg/dL   Nitrite NEGATIVE  NEGATIVE   Leukocytes, UA TRACE (*) NEGATIVE  LIPID PANEL      Component Value Range   Cholesterol 137  0 - 200 mg/dL   Triglycerides 295  <621 mg/dL   HDL 41  >30 mg/dL   Total CHOL/HDL Ratio 3.3     VLDL 21  0 - 40 mg/dL   LDL Cholesterol 75  0 - 99 mg/dL  URINE MICROSCOPIC-ADD ON      Component Value Range   Squamous Epithelial / LPF RARE  RARE   WBC, UA 0-2  <3 WBC/hpf   Mr Brain Wo Contrast  11/18/2011  *RADIOLOGY REPORT*  Clinical Data:  Speech abnormality today.  Headache.  History of meningioma with prior surgery 2009.  Prior MRIs were done at outside facility.  MRI HEAD WITHOUT CONTRAST MRA HEAD WITHOUT CONTRAST  Technique:  Multiplanar, multiecho pulse sequences of the brain and surrounding structures were obtained without intravenous contrast. Angiographic images of the head were obtained using MRA technique without contrast.  Comparison:  None  MRI HEAD  Findings:  Negative for acute infarct.  No significant chronic ischemia.  Prior right temporal craniotomy.  There is a mass lesion in the right anterior cavernous sinus extending into the orbital apex on the right.  There is a  mass displacing the optic nerve medially. This may be causing optic nerve compression.  Detailed imaging of the orbit was not performed at this time.  Negative for hemorrhage.  Ventricle size is normal.  IMPRESSION: Negative for acute infarct.  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  This is compatible with the given history of meningioma.  Correlation with prior MRI scans is suggested.  MRA HEAD  Findings: Both vertebral arteries are patent to the basilar.  PICA is patent bilaterally.  The basilar is patent.  Superior cerebellar and posterior cerebral arteries are patent bilaterally without significant stenosis.  Fetal origin of the left posterior cerebral artery.  Internal carotid artery is patent bilaterally.  Right cavernous carotid may be encased  by  meningioma.  Anterior and middle cerebral arteries are patent bilaterally without stenosis.  No intracranial stenosis or occlusion.  Negative for cerebral aneurysm.  IMPRESSION: Negative  Original Report Authenticated By: Camelia Phenes, M.D.   Mr Mra Head/brain Wo Cm  11/18/2011  *RADIOLOGY REPORT*  Clinical Data:  Speech abnormality today.  Headache.  History of meningioma with prior surgery 2009.  Prior MRIs were done at outside facility.  MRI HEAD WITHOUT CONTRAST MRA HEAD WITHOUT CONTRAST  Technique:  Multiplanar, multiecho pulse sequences of the brain and surrounding structures were obtained without intravenous contrast. Angiographic images of the head were obtained using MRA technique without contrast.  Comparison:  None  MRI HEAD  Findings:  Negative for acute infarct.  No significant chronic ischemia.  Prior right temporal craniotomy.  There is a mass lesion in the right anterior cavernous sinus extending into the orbital apex on the right.  There is a mass displacing the optic nerve medially. This may be causing optic nerve compression.  Detailed imaging of the  orbit was not  performed at this time.  Negative for hemorrhage.  Ventricle size is normal.  IMPRESSION: Negative for acute infarct.  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  This is compatible with the given history of meningioma.  Correlation with prior MRI scans is suggested.  MRA HEAD  Findings: Both vertebral arteries are patent to the basilar.  PICA is patent bilaterally.  The basilar is patent.  Superior cerebellar and posterior cerebral arteries are patent bilaterally without significant stenosis.  Fetal origin of the left posterior cerebral artery.  Internal carotid artery is patent bilaterally.  Right cavernous carotid may be encased  by  meningioma.  Anterior and middle cerebral arteries are patent bilaterally without stenosis.  No intracranial stenosis or occlusion.  Negative for cerebral aneurysm.  IMPRESSION: Negative  Original Report Authenticated By: Camelia Phenes, M.D.    7:32 PM No significant abnormalities on protocol including labs, MRI, echo, carotid dopplers. Results discussed with pt. Will d/c home with close outpatient follow up..  Pt continues to be neurovascularly intact. Asymptomatic.   Veronica Flores, [place] 11/18/11 1934

## 2011-11-18 NOTE — ED Provider Notes (Signed)
History     CSN: [id number]  Arrival date & time 11/18/11  1151   First MD Initiated Contact with Patient 11/18/11 1510      Chief Complaint  Patient presents with  . Migraine    (Consider location/radiation/quality/duration/timing/severity/associated sxs/prior treatment) Patient is a 66 y.o. female presenting with migraine. The history is provided by the patient.  Migraine This is a recurrent problem. The current episode started today. The problem occurs intermittently. The problem has been resolved. Pertinent negatives include no abdominal pain, chest pain, chills, congestion, coughing, diaphoresis, fatigue, fever, headaches, nausea, neck pain, numbness, rash, sore throat or vomiting. Nothing aggravates the symptoms. She has tried nothing for the symptoms. The treatment provided significant relief.    Past Medical History  Diagnosis Date  . Hypertension   . Allergy   . Asthma   . Fatigue   . Hypertrophic cardiomyopathy   . Autoimmune hepatitis   . Migraine, ophthalmoplegic   . Osteopenia     Past Surgical History  Procedure Date  . Cholecystectomy   . Ankle surgery   . Wrist surgery   . Brain surgery     History reviewed. No pertinent family history.  History  Substance Use Topics  . Smoking status: Not on file  . Smokeless tobacco: Not on file  . Alcohol Use:     OB History    Grav Para Term Preterm Abortions TAB SAB Ect Mult Living                  Review of Systems  Constitutional: Negative for fever, chills, diaphoresis and fatigue.  HENT: Negative for ear pain, congestion, sore throat, facial swelling, mouth sores, trouble swallowing, neck pain and neck stiffness.   Eyes: Positive for visual disturbance.  Respiratory: Negative for apnea, cough, chest tightness, shortness of breath and wheezing.   Cardiovascular: Negative for chest pain, palpitations and leg swelling.  Gastrointestinal: Negative for nausea, vomiting, abdominal pain, diarrhea and  abdominal distention.  Genitourinary: Negative for hematuria, flank pain, vaginal discharge, difficulty urinating and menstrual problem.  Musculoskeletal: Negative for back pain and gait problem.  Skin: Negative for rash and wound.  Neurological: Positive for speech difficulty. Negative for dizziness, tremors, seizures, syncope, facial asymmetry, numbness and headaches.  Psychiatric/Behavioral: Negative.   All other systems reviewed and are negative.    Allergies  Penicillins; Allegra; Desloratadine; and Cephalexin  Home Medications   Current Outpatient Rx  Name Route Sig Dispense Refill  . ASPIRIN 325 MG PO TABS Oral Take 325 mg by mouth daily.    . AZATHIOPRINE 50 MG PO TABS Oral Take 50 mg by mouth daily. 2 pills once a day    . CALCIUM CARBONATE-VITAMIN D 600-400 MG-UNIT PO CHEW Oral Chew 1 tablet by mouth 2 (two) times daily.      Marland Kitchen VITAMIN D 1000 UNITS PO TABS Oral Take 1,000 Units by mouth daily.    Marland Kitchen FLUTICASONE-SALMETEROL 100-50 MCG/DOSE IN AEPB Inhalation Inhale 1 puff into the lungs every 12 (twelve) hours.      . IPRATROPIUM BROMIDE 0.06 % NA SOLN Nasal Place 1 spray into the nose 3 (three) times daily.     Marland Kitchen MONTELUKAST SODIUM 10 MG PO TABS Oral Take 10 mg by mouth at bedtime.      Marland Kitchen PRAVASTATIN SODIUM 40 MG PO TABS Oral Take 40 mg by mouth daily.      Marland Kitchen VERAPAMIL HCL ER (CO) 240 MG PO TB24 Oral Take 1 tablet (240 mg total)  by mouth at bedtime. 30 tablet 3  . ALBUTEROL SULFATE HFA 108 (90 BASE) MCG/ACT IN AERS Inhalation Inhale 2 puffs into the lungs every 6 (six) hours as needed.        BP 140/80  Pulse 66  Temp 98.3 F (36.8 C) (Oral)  Resp 18  SpO2 100%  Physical Exam  Nursing note and vitals reviewed. Constitutional: She is oriented to person, place, and time. She appears well-developed and well-nourished. No distress.  HENT:  Head: Normocephalic and atraumatic.  Right Ear: External ear normal.  Left Ear: External ear normal.  Nose: Nose normal.    Mouth/Throat: Oropharynx is clear and moist. No oropharyngeal exudate.  Eyes: Conjunctivae and EOM are normal. Right eye exhibits no discharge. Left eye exhibits no discharge.       Patient with fixed right pupil related to a meningioma that was near her right pupil that he be surgically removed  Neck: Normal range of motion. Neck supple. No JVD present. No tracheal deviation present. No thyromegaly present.  Cardiovascular: Normal rate, regular rhythm, normal heart sounds and intact distal pulses.  Exam reveals no gallop and no friction rub.   No murmur heard. Pulmonary/Chest: Effort normal and breath sounds normal. No respiratory distress. She has no wheezes. She has no rales. She exhibits no tenderness.  Abdominal: Soft. Bowel sounds are normal. She exhibits no distension. There is no tenderness. There is no rebound and no guarding.  Musculoskeletal: Normal range of motion.  Lymphadenopathy:    She has no cervical adenopathy.  Neurological: She is alert and oriented to person, place, and time. No cranial nerve deficit. Coordination normal.       Patient with normal finger-nose heel shin. No dysdiadochokinesia. Patient with no truncal ataxia no gait ataxia negative Romberg sign normal 5 out of 5 strength normal sensation normal cranial nerve exam except for right pupillary exam which is chronically nonreactive to light status post meningioma surgery no nystagmus  Skin: Skin is warm. No rash noted. She is not diaphoretic.  Psychiatric: She has a normal mood and affect. Her behavior is normal. Judgment and thought content normal.    ED Course  Procedures (including critical care time)  Labs Reviewed  POCT I-STAT, CHEM 8 - Abnormal; Notable for the following:    Hemoglobin 16.3 (*)     HCT 48.0 (*)     All other components within normal limits   No results found.   No diagnosis found.    MDM  66 year old female patient past medical history of ocular migraines typically presenting  with visual disturbances presents with her typical migraine episode followed by 10 minutes of dysarthria. Patient says she gets these migraines 1-2 times per month with a primary symptom of light disturbances in her right eye. Patient had usual migraine today but that was subsequently followed by what she describes as 10 minutes of difficulty getting her words out. Patient with normal neurological exam as above. Patient with mild headache at this point in time but no nausea vomiting neck pain fevers abdominal pain, no recent illnesses. Patient with new onset symptoms of not being it would get her words out atypical for migraine headache. Given her risk factors and age patient be admitted to the CDU TIA protocol for TIA evaluation  Case discussed with Dr. Blanche East, MD 11/18/11 [id number]

## 2011-11-18 NOTE — Progress Notes (Signed)
  Echocardiogram 2D Echocardiogram has been performed.  [place] 11/18/2011, 6:06 PM

## 2011-11-18 NOTE — ED Provider Notes (Signed)
66 year old female had an episode of sense of seeing prismatic light which is typical of what she has with ocular migraines. This lasted about 5 minutes and resolved. Shortly after that, she had an episode of aphasia where she couldn't think of the word that she wanted to say but was not able to say anything out loud. This lasted about 10 minutes and that resolved gradually. Initially, she was able to get single words out without slurring and eventually she was able to speak normally. Her sister was there and asked her to smile did not notice any facial asymmetry. She denies any headache or chest pain or nausea or vomiting. She did not notice any weakness in her arms or legs although she states she doesn't quite feel right at this point. 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 She also has a history of hypertrophic cardiomyopathy. She went to her PCP advised her to come to the ED for possible evaluation for TIA. On exam, her right pupil is 5 mm and fixed at the left pupil reacts directly and consensually. EOMs are full. Fundi show no hemorrhage, exudate, or papilledema there. There is no facial asymmetry. There no carotid bruits. Lungs are clear. Heart has regular rate and rhythm without murmur. Strength in arms and legs are equal. There is no pronator drift. There no cranial nerve deficits other than the a fixed right pupil. She appears to have had a TIA and will be admitted to the CDU under TIA protocol.  ECG shows normal sinus rhythm with a rate of 67, no ectopy. Normal axis. Normal P wave. Normal QRS. Normal intervals. Normal ST and T waves. Impression: normal ECG. Compared with ECG of 11/17/2010, no significant changes are seen.   Dione Booze, MD 11/18/11 2217

## 2011-11-18 NOTE — ED Notes (Signed)
Patient has returned from [facility]. Echo tech is at bedside to complete study. Pt is awake and alert at this time.

## 2011-11-18 NOTE — Progress Notes (Signed)
*  PRELIMINARY RESULTS* Vascular Ultrasound Carotid Duplex (Doppler) has been completed.  Preliminary findings: Bilaterally no significant ICA stenosis with antegrade vertebral flow.  Farrel Demark RDMS 11/18/2011, 4:33 PM

## 2011-11-18 NOTE — ED Notes (Signed)
Pt reports that she had episode of ocular migraine around 930 this morning. Pt reports that, "she could not make her mouth form the words her brain wanted to say." This subsided around 1030 this morning. Pt denies weakness.

## 2011-11-18 NOTE — ED Provider Notes (Signed)
Medical screening examination/treatment/procedure(s) were conducted as a shared visit with non-physician practitioner(s) and myself.  I personally evaluated the patient during the encounter  See CDU documentation.   Dione Booze, MD 11/18/11 2329

## 2011-11-18 NOTE — ED Notes (Signed)
EKG completed 15:23

## 2011-11-19 NOTE — ED Provider Notes (Signed)
Medical screening examination/treatment/procedure(s) were conducted as a shared visit with non-physician practitioner(s) and myself.  I personally evaluated the patient during the encounter   Dione Booze, MD 11/19/11 2318

## 2011-11-21 DIAGNOSIS — G459 Transient cerebral ischemic attack, unspecified: Secondary | ICD-10-CM | POA: Diagnosis not present

## 2012-01-14 DIAGNOSIS — Z23 Encounter for immunization: Secondary | ICD-10-CM | POA: Diagnosis not present

## 2012-01-22 DIAGNOSIS — D320 Benign neoplasm of cerebral meninges: Secondary | ICD-10-CM | POA: Diagnosis not present

## 2012-01-22 DIAGNOSIS — Z9889 Other specified postprocedural states: Secondary | ICD-10-CM | POA: Diagnosis not present

## 2012-01-22 DIAGNOSIS — G43109 Migraine with aura, not intractable, without status migrainosus: Secondary | ICD-10-CM | POA: Diagnosis not present

## 2012-01-22 DIAGNOSIS — Z86011 Personal history of benign neoplasm of the brain: Secondary | ICD-10-CM | POA: Diagnosis not present

## 2012-02-10 DIAGNOSIS — K754 Autoimmune hepatitis: Secondary | ICD-10-CM | POA: Diagnosis not present

## 2012-02-10 DIAGNOSIS — G459 Transient cerebral ischemic attack, unspecified: Secondary | ICD-10-CM | POA: Diagnosis not present

## 2012-02-10 DIAGNOSIS — E785 Hyperlipidemia, unspecified: Secondary | ICD-10-CM | POA: Diagnosis not present

## 2012-02-27 DIAGNOSIS — G43809 Other migraine, not intractable, without status migrainosus: Secondary | ICD-10-CM | POA: Diagnosis not present

## 2012-02-27 DIAGNOSIS — G43109 Migraine with aura, not intractable, without status migrainosus: Secondary | ICD-10-CM | POA: Diagnosis not present

## 2012-06-14 DIAGNOSIS — K754 Autoimmune hepatitis: Secondary | ICD-10-CM | POA: Diagnosis not present

## 2012-06-14 DIAGNOSIS — E785 Hyperlipidemia, unspecified: Secondary | ICD-10-CM | POA: Diagnosis not present

## 2012-07-13 DIAGNOSIS — Z1231 Encounter for screening mammogram for malignant neoplasm of breast: Secondary | ICD-10-CM | POA: Diagnosis not present

## 2012-07-16 ENCOUNTER — Other Ambulatory Visit: Payer: Self-pay | Admitting: Family Medicine

## 2012-07-16 DIAGNOSIS — M8580 Other specified disorders of bone density and structure, unspecified site: Secondary | ICD-10-CM

## 2012-09-29 DIAGNOSIS — K754 Autoimmune hepatitis: Secondary | ICD-10-CM | POA: Diagnosis not present

## 2012-10-04 ENCOUNTER — Ambulatory Visit: Payer: Medicare Other | Admitting: Family Medicine

## 2012-10-05 DIAGNOSIS — H04569 Stenosis of unspecified lacrimal punctum: Secondary | ICD-10-CM | POA: Diagnosis not present

## 2012-10-05 DIAGNOSIS — H5210 Myopia, unspecified eye: Secondary | ICD-10-CM | POA: Diagnosis not present

## 2012-10-05 DIAGNOSIS — H02409 Unspecified ptosis of unspecified eyelid: Secondary | ICD-10-CM | POA: Diagnosis not present

## 2012-11-11 ENCOUNTER — Telehealth: Payer: Self-pay | Admitting: Family Medicine

## 2012-11-11 MED ORDER — FLUTICASONE-SALMETEROL 100-50 MCG/DOSE IN AEPB: 1.0000 | INHALATION_SPRAY | Freq: Two times a day (BID) | RESPIRATORY_TRACT | Status: DC

## 2012-11-11 MED ORDER — VERAPAMIL HCL 240 MG (CO) PO TB24: 240.0000 mg | ORAL_TABLET | Freq: Every day | ORAL | Status: DC

## 2012-11-11 NOTE — Telephone Encounter (Signed)
Rx Refilled  

## 2012-11-12 ENCOUNTER — Telehealth: Payer: Self-pay | Admitting: Family Medicine

## 2012-11-12 MED ORDER — FLUTICASONE-SALMETEROL 100-50 MCG/DOSE IN AEPB: 1.0000 | INHALATION_SPRAY | Freq: Two times a day (BID) | RESPIRATORY_TRACT | Status: DC

## 2012-11-12 MED ORDER — VERAPAMIL HCL 240 MG (CO) PO TB24: 240.0000 mg | ORAL_TABLET | Freq: Every day | ORAL | Status: DC

## 2012-11-12 NOTE — Telephone Encounter (Signed)
Rxs refilled again with notes to the pharmacy for generic and 90 day supply

## 2012-11-15 ENCOUNTER — Other Ambulatory Visit: Payer: Self-pay | Admitting: Family Medicine

## 2012-11-15 MED ORDER — VERAPAMIL HCL ER 240 MG PO CP24: 240.0000 mg | ORAL_CAPSULE | Freq: Every day | ORAL | Status: DC

## 2012-11-15 NOTE — Telephone Encounter (Signed)
Rx Refilled  

## 2012-11-23 ENCOUNTER — Ambulatory Visit (INDEPENDENT_AMBULATORY_CARE_PROVIDER_SITE_OTHER): Payer: Medicare Other | Admitting: Family Medicine

## 2012-11-23 ENCOUNTER — Encounter: Payer: Self-pay | Admitting: Family Medicine

## 2012-11-23 VITALS — BP 110/60 | HR 82 | Temp 98.0°F | Resp 18 | Wt 183.0 lb

## 2012-11-23 DIAGNOSIS — E785 Hyperlipidemia, unspecified: Secondary | ICD-10-CM | POA: Diagnosis not present

## 2012-11-23 DIAGNOSIS — K754 Autoimmune hepatitis: Secondary | ICD-10-CM

## 2012-11-23 NOTE — Progress Notes (Signed)
Subjective:    Patient ID: Veronica Flores, female    DOB: December 31, 1945, 67 y.o.   MRN: [id number]  HPI  Patient is here to discuss her blood work. She has a history of hyperlipidemia. She is currently on pravastatin 40 mg by mouth daily.  She also has a history of hypertension and hyper-trophic cardiomyopathy. She is currently on verapamil sustained release 240 mg by mouth daily. She denies any chest pain shortness of breath or dyspnea on exertion. Her most recent lab work is listed below: No visits with results within 2 Week(s) from this visit. Latest known visit with results is:  Admission on 11/18/2011, Discharged on 11/18/2011  Component Date Value Range Status  . Sodium 11/18/2011 143  135 - 145 mEq/L Final  . Potassium 11/18/2011 4.0  3.5 - 5.1 mEq/L Final  . Chloride 11/18/2011 108  96 - 112 mEq/L Final  . BUN 11/18/2011 11  6 - 23 mg/dL Final  . Creatinine, Ser 11/18/2011 0.90  0.50 - 1.10 mg/dL Final  . Glucose, Bld 40/98/1191 86  70 - 99 mg/dL Final  . Calcium, Ion 47/82/9562 1.22  1.13 - 1.30 mmol/L Final  . TCO2 11/18/2011 23  0 - 100 mmol/L Final  . Hemoglobin 11/18/2011 16.3* 12.0 - 15.0 g/dL Final  . HCT 13/11/6576 48.0* 36.0 - 46.0 % Final  . WBC 11/18/2011 6.9  4.0 - 10.5 K/uL Final  . RBC 11/18/2011 5.49* 3.87 - 5.11 MIL/uL Final  . Hemoglobin 11/18/2011 16.5* 12.0 - 15.0 g/dL Final  . HCT 46/96/2952 47.9* 36.0 - 46.0 % Final  . MCV 11/18/2011 87.2  78.0 - 100.0 fL Final  . MCH 11/18/2011 30.1  26.0 - 34.0 pg Final  . MCHC 11/18/2011 34.4  30.0 - 36.0 g/dL Final  . RDW 84/13/2440 14.9  11.5 - 15.5 % Final  . Platelets 11/18/2011 292  150 - 400 K/uL Final  . Sodium 11/18/2011 145  135 - 145 mEq/L Final  . Potassium 11/18/2011 3.7  3.5 - 5.1 mEq/L Final  . Chloride 11/18/2011 109  96 - 112 mEq/L Final  . CO2 11/18/2011 22  19 - 32 mEq/L Final  . Glucose, Bld 11/18/2011 78  70 - 99 mg/dL Final  . BUN 02/15/2535 10  6 - 23 mg/dL Final  . Creatinine, Ser 11/18/2011  0.63  0.50 - 1.10 mg/dL Final  . Calcium 64/40/3474 10.0  8.4 - 10.5 mg/dL Final  . Total Protein 11/18/2011 8.2  6.0 - 8.3 g/dL Final  . Albumin 25/95/6387 5.0  3.5 - 5.2 g/dL Final  . AST 56/43/3295 43* 0 - 37 U/L Final  . ALT 11/18/2011 39* 0 - 35 U/L Final  . Alkaline Phosphatase 11/18/2011 66  39 - 117 U/L Final  . Total Bilirubin 11/18/2011 0.6  0.3 - 1.2 mg/dL Final  . GFR calc non Af Amer 11/18/2011 >90  >90 mL/min Final  . GFR calc Af Amer 11/18/2011 >90  >90 mL/min Final   Comment:                                 The eGFR has been calculated                          using the CKD EPI equation.  This calculation has not been                          validated in all clinical                          situations.                          eGFR's persistently                          <90 mL/min signify                          possible Chronic Kidney Disease.  Marland Kitchen Prothrombin Time 11/18/2011 14.0  11.6 - 15.2 seconds Final  . INR 11/18/2011 1.06  0.00 - 1.49 Final  . aPTT 11/18/2011 31  24 - 37 seconds Final  . Color, Urine 11/18/2011 YELLOW  YELLOW Final  . APPearance 11/18/2011 CLEAR  CLEAR Final  . Specific Gravity, Urine 11/18/2011 1.007  1.005 - 1.030 Final  . pH 11/18/2011 8.0  5.0 - 8.0 Final  . Glucose, UA 11/18/2011 NEGATIVE  NEGATIVE mg/dL Final  . Hgb urine dipstick 11/18/2011 NEGATIVE  NEGATIVE Final  . Bilirubin Urine 11/18/2011 NEGATIVE  NEGATIVE Final  . Ketones, ur 11/18/2011 NEGATIVE  NEGATIVE mg/dL Final  . Protein, ur 65/78/4696 NEGATIVE  NEGATIVE mg/dL Final  . Urobilinogen, UA 11/18/2011 0.2  0.0 - 1.0 mg/dL Final  . Nitrite 29/52/8413 NEGATIVE  NEGATIVE Final  . Leukocytes, UA 11/18/2011 TRACE* NEGATIVE Final  . Cholesterol 11/18/2011 137  0 - 200 mg/dL Final  . Triglycerides 11/18/2011 106  <150 mg/dL Final  . HDL 24/40/1027 41  >39 mg/dL Final  . Total CHOL/HDL Ratio 11/18/2011 3.3   Final  . VLDL 11/18/2011 21  0 - 40 mg/dL  Final  . LDL Cholesterol 11/18/2011 75  0 - 99 mg/dL Final   Comment:                                 Total Cholesterol/HDL:CHD Risk                          Coronary Heart Disease Risk Table                                              Men   Women                           1/2 Average Risk   3.4   3.3                           Average Risk       5.0   4.4                           2 X Average Risk   9.6   7.1  3 X Average Risk  23.4   11.0                                                          Use the calculated Patient Ratio                          above and the CHD Risk Table                          to determine the patient's CHD Risk.                                                          ATP III CLASSIFICATION (LDL):                           <100     mg/dL   Optimal                           100-129  mg/dL   Near or Above                                             Optimal                           130-159  mg/dL   Borderline                           160-189  mg/dL   High                           >190     mg/dL   Very High  . Hemoglobin A1C 11/18/2011 5.5  <5.7 % Final   Comment: (NOTE)                                                                                                                         According to the ADA Clinical Practice Recommendations for 2011, when                          HbA1c is used as a screening test:                           >=  6.5%   Diagnostic of Diabetes Mellitus                                    (if abnormal result is confirmed)                          5.7-6.4%   Increased risk of developing Diabetes Mellitus                          References:Diagnosis and Classification of Diabetes Mellitus,Diabetes                          Care,2011,34(Suppl 1):S62-S69 and Standards of Medical Care in                                  Diabetes - 2011,Diabetes Care,2011,34 (Suppl 1):S11-S61.  . Mean Plasma Glucose  11/18/2011 111  <117 mg/dL Final  . Squamous Epithelial / LPF 11/18/2011 RARE  RARE Final  . WBC, UA 11/18/2011 0-2  <3 WBC/hpf Final   Past Medical History  Diagnosis Date  . Hypertension   . Allergy   . Asthma   . Fatigue   . Hypertrophic cardiomyopathy   . Autoimmune hepatitis   . Migraine, ophthalmoplegic   . Osteopenia    Current Outpatient Prescriptions on File Prior to Visit  Medication Sig Dispense Refill  . aspirin 325 MG tablet Take 325 mg by mouth daily.      Marland Kitchen azaTHIOprine (IMURAN) 50 MG tablet Take 50 mg by mouth daily. 2 pills once a day      . Calcium Carbonate-Vitamin D (CALTRATE 600+D) 600-400 MG-UNIT per chew tablet Chew 1 tablet by mouth 2 (two) times daily.        . cholecalciferol (VITAMIN D) 1000 UNITS tablet Take 1,000 Units by mouth daily.      . Fluticasone-Salmeterol (ADVAIR DISKUS) 100-50 MCG/DOSE AEPB Inhale 1 puff into the lungs every 12 (twelve) hours.  3 each  3  . ipratropium (ATROVENT) 0.06 % nasal spray Place 1 spray into the nose 3 (three) times daily.       . montelukast (SINGULAIR) 10 MG tablet Take 10 mg by mouth at bedtime.        . pravastatin (PRAVACHOL) 40 MG tablet Take 40 mg by mouth daily.        . verapamil (VERELAN PM) 240 MG 24 hr capsule Take 1 capsule (240 mg total) by mouth at bedtime.  90 capsule  2   No current facility-administered medications on file prior to visit.   Allergies  Allergen Reactions  . Penicillins Anaphylaxis  . Allegra (Fexofenadine Hydrochloride)     palpitations  . Desloratadine     Palpitations   . Cephalexin Hives and Rash   History   Social History  . Marital Status: Married    Spouse Name: N/A    Number of Children: N/A  . Years of Education: N/A   Occupational History  . Not on file.   Social History Main Topics  . Smoking status: Never Smoker   . Smokeless tobacco: Not on file  . Alcohol Use: No  . Drug Use: No  . Sexually Active:    Other Topics Concern  . Not on file  Social  History Narrative  . No narrative on file     Review of Systems  All other systems reviewed and are negative.       Objective:   Physical Exam  Vitals reviewed. Constitutional: She appears well-developed and well-nourished.  Neck: Neck supple. No JVD present. No thyromegaly present.  Cardiovascular: Normal rate, regular rhythm, normal heart sounds and intact distal pulses.  Exam reveals no gallop and no friction rub.   No murmur heard. Pulmonary/Chest: Effort normal and breath sounds normal. No respiratory distress. She has no wheezes. She has no rales. She exhibits no tenderness.  Abdominal: Soft. Bowel sounds are normal. She exhibits no distension and no mass. There is no tenderness. There is no rebound and no guarding.  Lymphadenopathy:    She has no cervical adenopathy.  Skin: Skin is warm. No rash noted. No erythema. No pallor.          Assessment & Plan:  1. HLD (hyperlipidemia) Cholesterol is well controlled. Continue pravastatin 40 mg by mouth daily. Blood pressure is excellent. No change in medications. 2. Autoimmune hepatitis Liver tests are just slightly elevated. Recheck a CMP in 3 months. Follow up again in 6 months or as needed.

## 2012-12-06 ENCOUNTER — Encounter: Payer: Self-pay | Admitting: Family Medicine

## 2012-12-06 ENCOUNTER — Ambulatory Visit
Admission: RE | Admit: 2012-12-06 | Discharge: 2012-12-06 | Disposition: A | Discharge status: Home / Self Care | Payer: Medicare Other | Source: Ambulatory Visit | Attending: Family Medicine | Admitting: Family Medicine

## 2012-12-06 ENCOUNTER — Ambulatory Visit (INDEPENDENT_AMBULATORY_CARE_PROVIDER_SITE_OTHER): Payer: Medicare Other | Admitting: Family Medicine

## 2012-12-06 VITALS — BP 102/62 | HR 88 | Temp 97.8°F | Resp 20 | Wt 186.0 lb

## 2012-12-06 DIAGNOSIS — R109 Unspecified abdominal pain: Secondary | ICD-10-CM

## 2012-12-06 DIAGNOSIS — R509 Fever, unspecified: Secondary | ICD-10-CM

## 2012-12-06 DIAGNOSIS — R0602 Shortness of breath: Secondary | ICD-10-CM | POA: Diagnosis not present

## 2012-12-06 LAB — CBC WITH DIFFERENTIAL/PLATELET
Basophils Absolute: 0 10*3/uL (ref 0.0–0.1)
Eosinophils Relative: 0 % (ref 0–5)
HCT: 46.1 % — ABNORMAL HIGH (ref 36.0–46.0)
Lymphocytes Relative: 27 % (ref 12–46)
MCH: 29.2 pg (ref 26.0–34.0)
MCHC: 34.5 g/dL (ref 30.0–36.0)
MCV: 84.7 fL (ref 78.0–100.0)
Monocytes Absolute: 0.4 10*3/uL (ref 0.1–1.0)
RDW: 15.8 % — ABNORMAL HIGH (ref 11.5–15.5)
WBC: 3.3 10*3/uL — ABNORMAL LOW (ref 4.0–10.5)

## 2012-12-06 LAB — COMPLETE METABOLIC PANEL WITH GFR
AST: 25 U/L (ref 0–37)
BUN: 10 mg/dL (ref 6–23)
Calcium: 9.4 mg/dL (ref 8.4–10.5)
Chloride: 101 mEq/L (ref 96–112)
Creat: 0.88 mg/dL (ref 0.50–1.10)
GFR, Est African American: 79 mL/min

## 2012-12-06 LAB — URINALYSIS, ROUTINE W REFLEX MICROSCOPIC
Bilirubin Urine: NEGATIVE
Glucose, UA: NEGATIVE mg/dL
Hgb urine dipstick: NEGATIVE
Ketones, ur: NEGATIVE mg/dL
Protein, ur: NEGATIVE mg/dL

## 2012-12-06 NOTE — Progress Notes (Signed)
Subjective:    Patient ID: Veronica Flores, female    DOB: Jun 14, 1945, 67 y.o.   MRN: [id number]  HPI Patient began experiencing fevers 4 days ago.  They generally occur at night. They range as high as 102.0.  The fevers began before any symptoms.  Since the fevers began she has had some mild postnasal drip and a scratchy throat but nothing of any clinical significance. Also reports occasional suprapubic abdominal discomfort. The pain is described as a vague cramp-like pain that lasts a few seconds and then resolve spontaneously. It has only occurred a few times. The patient states that she would not even have noticed it had not been for the fever. He denies any cough, otalgia, nausea, vomiting, diarrhea, constipation, hematochezia, dysuria, frequency, urgency. She has not been bitten by any ticks in the last month. She denies any rashes or arthralgias.  She does have a history of autoimmune hepatitis on chronic immunosuppressive therapy. At her last office visit her liver function tests were marginally elevated. She denies any jaundice or itching.  She denies any symptoms of a DVT.  Past Medical History  Diagnosis Date  . Hypertension   . Allergy   . Asthma   . Fatigue   . Hypertrophic cardiomyopathy   . Autoimmune hepatitis   . Migraine, ophthalmoplegic   . Osteopenia    Current Outpatient Prescriptions on File Prior to Visit  Medication Sig Dispense Refill  . aspirin 325 MG tablet Take 325 mg by mouth daily.      Marland Kitchen azaTHIOprine (IMURAN) 50 MG tablet Take 50 mg by mouth daily. 2 pills once a day      . Calcium Carbonate-Vitamin D (CALTRATE 600+D) 600-400 MG-UNIT per chew tablet Chew 1 tablet by mouth 2 (two) times daily.        . cholecalciferol (VITAMIN D) 1000 UNITS tablet Take 1,000 Units by mouth daily.      . Fluticasone-Salmeterol (ADVAIR DISKUS) 100-50 MCG/DOSE AEPB Inhale 1 puff into the lungs every 12 (twelve) hours.  3 each  3  . ipratropium (ATROVENT) 0.06 % nasal spray Place 1  spray into the nose 3 (three) times daily.       . montelukast (SINGULAIR) 10 MG tablet Take 10 mg by mouth at bedtime.        . pravastatin (PRAVACHOL) 40 MG tablet Take 40 mg by mouth daily.        . verapamil (VERELAN PM) 240 MG 24 hr capsule Take 1 capsule (240 mg total) by mouth at bedtime.  90 capsule  2   No current facility-administered medications on file prior to visit.   Allergies  Allergen Reactions  . Penicillins Anaphylaxis  . Allegra [Fexofenadine Hydrochloride]     palpitations  . Desloratadine     Palpitations   . Cephalexin Hives and Rash   History   Social History  . Marital Status: Married    Spouse Name: N/A    Number of Children: N/A  . Years of Education: N/A   Occupational History  . Not on file.   Social History Main Topics  . Smoking status: Never Smoker   . Smokeless tobacco: Not on file  . Alcohol Use: No  . Drug Use: No  . Sexual Activity:    Other Topics Concern  . Not on file   Social History Narrative  . No narrative on file      Review of Systems  All other systems reviewed and are negative.  Objective:   Physical Exam  Vitals reviewed. Constitutional: She is oriented to person, place, and time. She appears well-developed and well-nourished. No distress.  HENT:  Head: Normocephalic and atraumatic.  Right Ear: External ear normal.  Left Ear: External ear normal.  Nose: Nose normal.  Mouth/Throat: Oropharynx is clear and moist. No oropharyngeal exudate.  Eyes: Conjunctivae are normal. Pupils are equal, round, and reactive to light. No scleral icterus.  Neck: Normal range of motion. Neck supple. No JVD present. No thyromegaly present.  Cardiovascular: Normal rate, regular rhythm and intact distal pulses.  Exam reveals no gallop and no friction rub.   Murmur heard. Pulmonary/Chest: Effort normal and breath sounds normal. No respiratory distress. She has no wheezes. She has no rales. She exhibits no tenderness.   Abdominal: Soft. Bowel sounds are normal. She exhibits no distension and no mass. There is no tenderness. There is no rebound and no guarding.  Musculoskeletal: Normal range of motion. She exhibits no edema and no tenderness.  Lymphadenopathy:    She has no cervical adenopathy.  Neurological: She is alert and oriented to person, place, and time. She has normal reflexes. She displays normal reflexes. No cranial nerve deficit. She exhibits normal muscle tone. Coordination normal.  Skin: Skin is warm. No rash noted. She is not diaphoretic. No erythema.          Assessment & Plan:  1. Abdominal  pain, other specified site - Urinalysis, Routine w reflex microscopic - CBC with Differential - COMPLETE METABOLIC PANEL WITH GFR  2. Fever, unspecified Urinalysis today is completely clear. I will send a urine culture to be her. Most likely this represents some type of viral syndrome. However given her history of autoimmune hepatitis I am going to check a sedimentation rate and CMP. If the sedimentation rate and liver function tests are elevated we will likely need to discuss with her gastroenterologist and urologist for medical therapy. I will also check blood cultures, RMSF and Lyme titers, and a chest x-ray. If the above workup is normal, I would recommend a tincture of time for approximately 7 days to see if it if the fevers will subside spontaneously such as a virus. Obviously if any specific symptoms develop, we will treat that symptom. - CBC with Differential - COMPLETE METABOLIC PANEL WITH GFR - CULTURE, URINE COMPREHENSIVE - Sedimentation rate - [place] m[place] spotted fvr abs pnl(IgG+IgM) - B. burgdorfi antibodies by WB - Culture, blood (single) - Culture, blood (single) - DG Chest 2 View; Future

## 2012-12-07 LAB — CULTURE, URINE COMPREHENSIVE
Colony Count: NO GROWTH
Organism ID, Bacteria: NO GROWTH

## 2012-12-08 LAB — B. BURGDORFI ANTIBODIES BY WB: B burgdorferi IgG Abs (IB): NEGATIVE

## 2012-12-12 LAB — CULTURE, BLOOD (SINGLE)
Organism ID, Bacteria: NO GROWTH
Organism ID, Bacteria: NO GROWTH

## 2012-12-24 ENCOUNTER — Telehealth: Payer: Self-pay | Admitting: Family Medicine

## 2012-12-24 MED ORDER — PRAVASTATIN SODIUM 40 MG PO TABS: 40.0000 mg | ORAL_TABLET | Freq: Every day | ORAL | Status: DC

## 2012-12-24 NOTE — Telephone Encounter (Signed)
Pravastatin 40 mg tab 1 QHS #90

## 2012-12-24 NOTE — Telephone Encounter (Signed)
Rx Refilled  

## 2013-01-20 DIAGNOSIS — D320 Benign neoplasm of cerebral meninges: Secondary | ICD-10-CM | POA: Diagnosis not present

## 2013-01-20 DIAGNOSIS — Z9889 Other specified postprocedural states: Secondary | ICD-10-CM | POA: Diagnosis not present

## 2013-01-20 DIAGNOSIS — D329 Benign neoplasm of meninges, unspecified: Secondary | ICD-10-CM | POA: Insufficient documentation

## 2013-01-20 DIAGNOSIS — Z48811 Encounter for surgical aftercare following surgery on the nervous system: Secondary | ICD-10-CM | POA: Diagnosis not present

## 2013-01-27 ENCOUNTER — Ambulatory Visit (INDEPENDENT_AMBULATORY_CARE_PROVIDER_SITE_OTHER): Payer: Medicare Other | Admitting: *Deleted

## 2013-01-27 DIAGNOSIS — Z23 Encounter for immunization: Secondary | ICD-10-CM

## 2013-05-05 ENCOUNTER — Other Ambulatory Visit: Payer: Self-pay | Admitting: Family Medicine

## 2013-05-05 ENCOUNTER — Encounter: Payer: Self-pay | Admitting: *Deleted

## 2013-05-05 MED ORDER — VERAPAMIL HCL ER 240 MG PO TBCR: 240.0000 mg | EXTENDED_RELEASE_TABLET | Freq: Every day | ORAL | Status: DC

## 2013-05-05 NOTE — Telephone Encounter (Signed)
This encounter was created in error - please disregard.

## 2013-05-24 ENCOUNTER — Other Ambulatory Visit: Payer: Self-pay | Admitting: Family Medicine

## 2013-05-24 MED ORDER — MONTELUKAST SODIUM 10 MG PO TABS: 10.0000 mg | ORAL_TABLET | Freq: Every day | ORAL | Status: DC

## 2013-05-24 NOTE — Telephone Encounter (Signed)
Rx Refilled  

## 2013-05-26 ENCOUNTER — Ambulatory Visit: Payer: Medicare Other | Admitting: Family Medicine

## 2013-05-27 ENCOUNTER — Encounter: Payer: Self-pay | Admitting: Family Medicine

## 2013-05-27 ENCOUNTER — Ambulatory Visit (INDEPENDENT_AMBULATORY_CARE_PROVIDER_SITE_OTHER): Payer: Medicare Other | Admitting: Family Medicine

## 2013-05-27 VITALS — BP 112/70 | HR 70 | Temp 97.2°F | Resp 18 | Ht 67.5 in | Wt 185.0 lb

## 2013-05-27 DIAGNOSIS — Z23 Encounter for immunization: Secondary | ICD-10-CM

## 2013-05-27 DIAGNOSIS — E785 Hyperlipidemia, unspecified: Secondary | ICD-10-CM | POA: Diagnosis not present

## 2013-05-27 DIAGNOSIS — R7309 Other abnormal glucose: Secondary | ICD-10-CM | POA: Diagnosis not present

## 2013-05-27 DIAGNOSIS — R739 Hyperglycemia, unspecified: Secondary | ICD-10-CM

## 2013-05-27 LAB — LIPID PANEL
Cholesterol: 117 mg/dL (ref 0–200)
HDL: 38 mg/dL — ABNORMAL LOW (ref >39)
LDL CALC: 59 mg/dL (ref 0–99)
Total CHOL/HDL Ratio: 3.1 Ratio
Triglycerides: 99 mg/dL (ref <150)
VLDL: 20 mg/dL (ref 0–40)

## 2013-05-27 LAB — COMPLETE METABOLIC PANEL WITH GFR
ALT: 9 U/L (ref 0–35)
AST: 19 U/L (ref 0–37)
Albumin: 4.5 g/dL (ref 3.5–5.2)
Alkaline Phosphatase: 45 U/L (ref 39–117)
BILIRUBIN TOTAL: 0.6 mg/dL (ref 0.2–1.2)
BUN: 14 mg/dL (ref 6–23)
CO2: 20 meq/L (ref 19–32)
Calcium: 9.9 mg/dL (ref 8.4–10.5)
Chloride: 108 mEq/L (ref 96–112)
Creat: 0.89 mg/dL (ref 0.50–1.10)
GFR, EST AFRICAN AMERICAN: 78 mL/min
GFR, Est Non African American: 67 mL/min
Glucose, Bld: 83 mg/dL (ref 70–99)
Potassium: 4.5 mEq/L (ref 3.5–5.3)
SODIUM: 141 meq/L (ref 135–145)
Total Protein: 7 g/dL (ref 6.0–8.3)

## 2013-05-27 LAB — CBC WITH DIFFERENTIAL/PLATELET
BASOS ABS: 0 10*3/uL (ref 0.0–0.1)
Basophils Relative: 0 % (ref 0–1)
EOS ABS: 0 10*3/uL (ref 0.0–0.7)
Eosinophils Relative: 0 % (ref 0–5)
HCT: 43.5 % (ref 36.0–46.0)
HEMOGLOBIN: 14.9 g/dL (ref 12.0–15.0)
Lymphocytes Relative: 26 % (ref 12–46)
Lymphs Abs: 1.1 10*3/uL (ref 0.7–4.0)
MCH: 29.1 pg (ref 26.0–34.0)
MCHC: 34.3 g/dL (ref 30.0–36.0)
MCV: 85 fL (ref 78.0–100.0)
MONOS PCT: 8 % (ref 3–12)
Monocytes Absolute: 0.3 10*3/uL (ref 0.1–1.0)
NEUTROS PCT: 66 % (ref 43–77)
Neutro Abs: 2.9 10*3/uL (ref 1.7–7.7)
Platelets: 323 10*3/uL (ref 150–400)
RBC: 5.12 MIL/uL — ABNORMAL HIGH (ref 3.87–5.11)
RDW: 15.3 % (ref 11.5–15.5)
WBC: 4.4 10*3/uL (ref 4.0–10.5)

## 2013-05-27 LAB — HEMOGLOBIN A1C
Hgb A1c MFr Bld: 5.4 % (ref <5.7)
MEAN PLASMA GLUCOSE: 108 mg/dL (ref <117)

## 2013-05-27 NOTE — Progress Notes (Signed)
Subjective:    Patient ID: Veronica Flores, female    DOB: 01/06/1946, 68 y.o.   MRN: [id number]  HPI At the patient's last office visit in August she was found to have a fasting blood sugar of 114.  She has a strong family history of diabetes. She denies any polyuria polydipsia or blurry vision beyond her baseline visual changes after her meningioma. She also has hyperlipidemia for which he takes pravastatin 40 mg by mouth daily. The no myalgias or right upper quadrant pain. She also has a history of autoimmune hepatitis. There is no evidence of jaundice or abdominal pain. She does complain of some mild pain in her left shoulder with abduction greater than 90. Otherwise she is doing well. She is due for Prevnar 13. Past Medical History  Diagnosis Date  . Hypertension   . Allergy   . Asthma   . Fatigue   . Hypertrophic cardiomyopathy   . Autoimmune hepatitis   . Migraine, ophthalmoplegic   . Osteopenia    Past Surgical History  Procedure Laterality Date  . Cholecystectomy    . Ankle surgery    . Wrist surgery    . Brain surgery     Current Outpatient Prescriptions on File Prior to Visit  Medication Sig Dispense Refill  . aspirin 325 MG tablet Take 325 mg by mouth daily.      Marland Kitchen azaTHIOprine (IMURAN) 50 MG tablet Take 50 mg by mouth daily. 2 pills once a day      . Calcium Carbonate-Vitamin D (CALTRATE 600+D) 600-400 MG-UNIT per chew tablet Chew 1 tablet by mouth 2 (two) times daily.        . cholecalciferol (VITAMIN D) 1000 UNITS tablet Take 1,000 Units by mouth daily.      . Fluticasone-Salmeterol (ADVAIR DISKUS) 100-50 MCG/DOSE AEPB Inhale 1 puff into the lungs every 12 (twelve) hours.  3 each  3  . ipratropium (ATROVENT) 0.06 % nasal spray Place 1 spray into the nose 3 (three) times daily.       . montelukast (SINGULAIR) 10 MG tablet Take 1 tablet (10 mg total) by mouth at bedtime.  90 tablet  4  . pravastatin (PRAVACHOL) 40 MG tablet Take 1 tablet (40 mg total) by mouth daily.   90 tablet  2  . verapamil (CALAN-SR) 240 MG CR tablet Take 1 tablet (240 mg total) by mouth daily.  90 tablet  4   No current facility-administered medications on file prior to visit.   Allergies  Allergen Reactions  . Penicillins Anaphylaxis  . Allegra [Fexofenadine Hydrochloride]     palpitations  . Desloratadine     Palpitations   . Cephalexin Hives and Rash   History   Social History  . Marital Status: Married    Spouse Name: N/A    Number of Children: N/A  . Years of Education: N/A   Occupational History  . Not on file.   Social History Main Topics  . Smoking status: Never Smoker   . Smokeless tobacco: Not on file  . Alcohol Use: No  . Drug Use: No  . Sexual Activity:    Other Topics Concern  . Not on file   Social History Narrative  . No narrative on file      Review of Systems  All other systems reviewed and are negative.       Objective:   Physical Exam  Vitals reviewed. Constitutional: She appears well-developed and well-nourished.  HENT:  Mouth/Throat: Oropharynx  is clear and moist.  Eyes: Conjunctivae are normal. No scleral icterus.  Neck: Neck supple. No thyromegaly present.  Cardiovascular: Normal rate, regular rhythm and normal heart sounds.   No murmur heard. Pulmonary/Chest: Effort normal and breath sounds normal. No respiratory distress. She has no wheezes. She has no rales.  Abdominal: Soft. Bowel sounds are normal. She exhibits no distension. There is no tenderness. There is no rebound and no guarding.  Musculoskeletal: She exhibits no edema.  Lymphadenopathy:    She has no cervical adenopathy.          Assessment & Plan:  1. HLD (hyperlipidemia) Check fasting lipid panel. Go LDL is now less than 100 based on her hyperglycemia. - COMPLETE METABOLIC PANEL WITH GFR - CBC with Differential - Lipid panel  2. Hyperglycemia Repeat fasting blood sugar today and also check a hemoglobin A1c. I recommended a low carbohydrate diet  and trying to increase aerobic exercise to 30 minutes a day 5 days a week. - Hemoglobin A1c

## 2013-05-27 NOTE — Addendum Note (Signed)
Addended by: Shary Decamp B on: 05/27/2013 01:05 PM   Modules accepted: Orders

## 2013-05-30 DIAGNOSIS — M899 Disorder of bone, unspecified: Secondary | ICD-10-CM | POA: Diagnosis not present

## 2013-05-30 DIAGNOSIS — M949 Disorder of cartilage, unspecified: Secondary | ICD-10-CM | POA: Diagnosis not present

## 2013-07-14 DIAGNOSIS — Z1231 Encounter for screening mammogram for malignant neoplasm of breast: Secondary | ICD-10-CM | POA: Diagnosis not present

## 2013-07-20 ENCOUNTER — Encounter: Payer: Self-pay | Admitting: Family Medicine

## 2013-08-02 ENCOUNTER — Encounter: Payer: Self-pay | Admitting: Family Medicine

## 2013-08-02 ENCOUNTER — Ambulatory Visit (INDEPENDENT_AMBULATORY_CARE_PROVIDER_SITE_OTHER): Payer: Medicare Other | Admitting: Family Medicine

## 2013-08-02 VITALS — BP 128/66 | HR 72 | Temp 97.4°F | Resp 14 | Ht 66.0 in | Wt 187.0 lb

## 2013-08-02 DIAGNOSIS — T754XXA Electrocution, initial encounter: Secondary | ICD-10-CM

## 2013-08-02 DIAGNOSIS — R42 Dizziness and giddiness: Secondary | ICD-10-CM | POA: Diagnosis not present

## 2013-08-02 NOTE — Patient Instructions (Addendum)
EKG looks good We will f/u in 1 week and see how your symptoms   Electric Shock Injury Electric shock injuries may be caused by lightning or electricity (current) passing through the body. The amount of injury depends on the current's pressure (voltage), the amount of current (amperage), the type of current (direct vs. alternating), the body's resistance to the current, the current's path through the body, and how long the body remains in contact with the current. Current is the flow of electricity. Electricity may produce effects ranging from barely noticeable tingling to instant death; every part of the body is vulnerable.  The harshness of injury depends mostly on the voltage. Low voltage can be as dangerous as high voltage under the right circumstances. People have been killed by shocks of just 50 volts. WHAT DETERMINES THE EFFECTS OF ELECTRICITY? How electric shocks affect the skin is determined by the skin's resistance. This is the skin's ability to stay unharmed by a shock. This, in turn, depends upon the wetness, dryness, thickness and or cleanliness of the skin. Thin or wet skin is much less resistant than thick or dry skin. When skin resistance is low, the current may cause little or no skin damage but may severely burn internal organs and tissues. Conversely, high skin resistance, such as with dry thick skin, can produce severe skin burns but decreases the current entering the body. WHAT PARTS OF THE BODY DOES ELECTRICITY AFFECT THE MOST?  The nervous system (the brain, spinal cord, and nerves) are most helpless to the effects of electricity and most often harmed in electrical injury. Some damage is minor and clears up on its own or with treatment. Sometimes the damage is severe and will be permanent. Neurological problems may be apparent immediately after the accident, or gradually develop over a period of up to three years.  Damage to the respiratory and cardiovascular systems happens  immediately. Electric shocks can paralyze the respiratory system (stop breathing) or disrupt heart action (cause the heart to beat irregularly or stop). This may cause instant death. Smaller veins and arteries, which get hot more easily than the larger blood vessels, are at greater risk. They can develop blood clots. Damage to the smaller vessels is a common cause of amputation following high-voltage injuries.  Other injuries may include cataracts, kidney failure, and injury to muscle tissue. An electric arc may set clothing and flammable substances on fire which may cause burns. Strong shocks are often accompanied by violent muscle spasms that can break and dislocate bones. These spasms can also freeze the victim in place and prevent him or her from breaking away from the current. DIAGNOSIS  Diagnosis relies on information about the cause of the accident, physical examination, and close monitoring of the heart, lungs, neurological condition and kidney activity. These conditions can change rapidly so close observation is necessary. Magnetic resonance imaging (MRI) may be necessary to check for brain injury. TREATMENT   When an electrical accident happens at home or in the workplace, emergency medical help should be summoned as quickly as possible. The main power should immediately be shut off. If that cannot be done, and current is still flowing through the victim, stand on a dry, non-conducting surface such as a folded newspaper, flattened cardboard carton, or plastic or rubber mat. Use a non-conducting object such as a wooden broomstick (never a damp or metallic object) to push the victim away from the source of the current. Non-conducting means the substance will not pass electricity easily through  it. Do not touch the victim or electrical source while the current is still flowing. This may electrocute the rescuer.  If the victim is faint, pale, or showing signs of shock, lay the victim down, with the  legs elevated above the level of the chest. Warm the person with a blanket.  If a pulse can not be felt, or the person is not breathing, someone trained in cardiopulmonary resuscitation (CPR) should begin CPR. Continue this until help arrives.  If the victim is burned, remove clothing that comes off easily. Rinse the burned area in cool water for pain relief. Give first aid for burns. Burns often require treatment at a burn center.  Electrical injury can be associated with explosions or falls that can cause other injuries. Avoid moving the head or neck if an injury to this area is suspected.  Give first aid as needed for other wounds or fractures.  Fluid replacement therapy is necessary to restore lost fluids and electrolytes. Severely injured tissue is repaired surgically.  Antibiotics and antibacterial creams are used to prevent infection.  Kidney failure may need to be treated.  Physical therapy may help recovery along with counseling if there is disfigurement. PROGNOSIS   Electric shocks may cause death.  Survivors may require amputation. Cosmetic problems may result along with disfigurement.  Injuries from household appliances and other low-voltage sources are less likely to produce extreme damage. PREVENTION   Know electrical dangers in your home.  Damaged electric appliances, wiring, cords, and plugs should be repaired or replaced. Electrical repairs should be attempted only by people with the proper training.  Hair dryers, radios, and other electric appliances should never be used in the bathroom or anywhere else they might accidentally come in contact with water. Water and pipes create a ground and the electricity picks the easiest way to go to ground which can be through your body.  Young children need to be kept away from electric appliances and should be taught about the dangers of electricity as soon as they are old enough.  Electric outlets require safety covers in  homes with young children.  During lightning and thunder storms, go indoors immediately, even if no rain is falling. Boaters should return to shore as rapidly as possible.  If the hair on your head or arms stands on end during a storm, seek cover as rapidly as possible as a lightning strike may be about to happen.  If you cannot reach indoor shelter, stay away from metallic objects such as golf clubs or fishing rods and lie down in low-ground areas. Standing or lying under or next to tall or metallic structures is unsafe. For example, it is unsafe to stand under a tree during a lightning storm. Do not stand next to long conductors of electricity such as wire fences.  An automobile is appropriate cover, as long as the radio is off.  Telephones, computers, hair dryers, and other appliances that can act as channels for lightning should not be used during a thunder storm.  During storms, stay away from screens and metal that may conduct electricity from the outside. SEEK IMMEDIATE MEDICAL CARE IF:  You develop chest pain.  A part of your arms or legs becomes very swollen or painful.  One of your arms or legs appears pale, cool, or discolored.  Your urine becomes discolored, or you are not urinating as much as usual.  You develop severe abdominal pain. Document Released: 04/10/2003 Document Revised: 06/30/2011 Document Reviewed: 05/11/2008 [employer] Patient  Information 2014 [employer].  
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
## 2013-08-04 ENCOUNTER — Encounter: Payer: Self-pay | Admitting: Family Medicine

## 2013-08-15 ENCOUNTER — Other Ambulatory Visit: Payer: Self-pay | Admitting: *Deleted

## 2013-08-15 ENCOUNTER — Telehealth: Payer: Self-pay | Admitting: *Deleted

## 2013-08-15 DIAGNOSIS — T754XXA Electrocution, initial encounter: Secondary | ICD-10-CM

## 2013-08-15 DIAGNOSIS — R51 Headache: Secondary | ICD-10-CM

## 2013-08-15 DIAGNOSIS — R42 Dizziness and giddiness: Secondary | ICD-10-CM
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
## 2013-08-15 NOTE — Telephone Encounter (Signed)
Call placed to patient and patient made aware.   Order placed.

## 2013-08-15 NOTE — Telephone Encounter (Signed)
Returned call to patient.   Reports that her dizziness has not improved and requested MD advice.   States that MD suggested MRI, but patient wanted to defer x1 week.   MD please advise.

## 2013-08-15 NOTE — Telephone Encounter (Signed)
Message copied by Sheral Flow on Mon Aug 15, 2013  9:35 AM ------      Message from: Veronica Flores      Created: Mon Aug 15, 2013  8:28 AM       Patient is calling regarding her visit last week and dr Buelah Manis had mentioned an mri, would like a call back because she is not doing much better             [phone] ------

## 2013-08-18 ENCOUNTER — Ambulatory Visit
Admission: RE | Admit: 2013-08-18 | Discharge: 2013-08-18 | Disposition: A | Discharge status: Home / Self Care | Payer: Medicare Other | Source: Ambulatory Visit | Attending: Family Medicine | Admitting: Family Medicine

## 2013-08-18 DIAGNOSIS — D320 Benign neoplasm of cerebral meninges: Secondary | ICD-10-CM | POA: Diagnosis not present

## 2013-08-18 DIAGNOSIS — R51 Headache: Secondary | ICD-10-CM

## 2013-08-18 DIAGNOSIS — R42 Dizziness and giddiness: Secondary | ICD-10-CM

## 2013-08-18 DIAGNOSIS — T754XXA Electrocution, initial encounter: Secondary | ICD-10-CM

## 2013-08-18 MED ORDER — GADOBENATE DIMEGLUMINE 529 MG/ML IV SOLN: 12.0000 mL | Freq: Once | INTRAVENOUS | Status: DC | PRN

## 2013-08-19 ENCOUNTER — Other Ambulatory Visit: Payer: Self-pay | Admitting: *Deleted

## 2013-08-19 MED ORDER — MECLIZINE HCL 32 MG PO TABS: 32.0000 mg | ORAL_TABLET | Freq: Three times a day (TID) | ORAL | Status: DC | PRN

## 2013-08-22 ENCOUNTER — Encounter: Payer: Self-pay | Admitting: Family Medicine

## 2013-09-19 DIAGNOSIS — Z79899 Other long term (current) drug therapy: Secondary | ICD-10-CM | POA: Diagnosis not present

## 2013-09-19 DIAGNOSIS — R6889 Other general symptoms and signs: Secondary | ICD-10-CM | POA: Diagnosis not present

## 2013-09-19 DIAGNOSIS — K754 Autoimmune hepatitis: Secondary | ICD-10-CM | POA: Diagnosis not present

## 2013-09-19 DIAGNOSIS — E785 Hyperlipidemia, unspecified: Secondary | ICD-10-CM | POA: Diagnosis not present

## 2013-09-30 ENCOUNTER — Telehealth: Payer: Self-pay | Admitting: Family Medicine

## 2013-09-30 DIAGNOSIS — E785 Hyperlipidemia, unspecified: Secondary | ICD-10-CM

## 2013-09-30 MED ORDER — PRAVASTATIN SODIUM 40 MG PO TABS: 40.0000 mg | ORAL_TABLET | Freq: Every day | ORAL | Status: DC

## 2013-09-30 NOTE — Telephone Encounter (Signed)
Medication refilled per protocol. 

## 2013-10-06 ENCOUNTER — Other Ambulatory Visit: Payer: Self-pay | Admitting: Family Medicine

## 2013-10-06 DIAGNOSIS — E785 Hyperlipidemia, unspecified: Secondary | ICD-10-CM

## 2013-10-06 MED ORDER — PRAVASTATIN SODIUM 40 MG PO TABS: 40.0000 mg | ORAL_TABLET | Freq: Every day | ORAL | Status: DC

## 2013-10-06 NOTE — Telephone Encounter (Signed)
Rx Refilled  

## 2013-10-07 ENCOUNTER — Telehealth: Payer: Self-pay | Admitting: Family Medicine

## 2013-10-07 DIAGNOSIS — K754 Autoimmune hepatitis: Secondary | ICD-10-CM | POA: Diagnosis not present

## 2013-10-07 DIAGNOSIS — E785 Hyperlipidemia, unspecified: Secondary | ICD-10-CM

## 2013-10-07 MED ORDER — PRAVASTATIN SODIUM 40 MG PO TABS: 40.0000 mg | ORAL_TABLET | Freq: Every day | ORAL | Status: DC

## 2013-10-07 NOTE — Telephone Encounter (Signed)
Medication refilled per protocol. 

## 2013-10-13 ENCOUNTER — Encounter: Payer: Self-pay | Admitting: Family Medicine

## 2013-11-09 DIAGNOSIS — H47099 Other disorders of optic nerve, not elsewhere classified, unspecified eye: Secondary | ICD-10-CM | POA: Diagnosis not present

## 2013-11-09 DIAGNOSIS — D432 Neoplasm of uncertain behavior of brain, unspecified: Secondary | ICD-10-CM | POA: Diagnosis not present

## 2013-11-09 DIAGNOSIS — H5210 Myopia, unspecified eye: Secondary | ICD-10-CM | POA: Diagnosis not present

## 2013-11-09 DIAGNOSIS — H5340 Unspecified visual field defects: Secondary | ICD-10-CM | POA: Diagnosis not present

## 2013-11-18 ENCOUNTER — Other Ambulatory Visit: Payer: Self-pay | Admitting: Family Medicine

## 2013-11-18 DIAGNOSIS — E785 Hyperlipidemia, unspecified: Secondary | ICD-10-CM

## 2013-11-18 MED ORDER — FLUTICASONE-SALMETEROL 100-50 MCG/DOSE IN AEPB: 1.0000 | INHALATION_SPRAY | Freq: Two times a day (BID) | RESPIRATORY_TRACT | Status: DC

## 2013-11-18 MED ORDER — PRAVASTATIN SODIUM 40 MG PO TABS: 40.0000 mg | ORAL_TABLET | Freq: Every day | ORAL | Status: DC

## 2013-11-18 NOTE — Telephone Encounter (Signed)
meds sent to pharm

## 2013-11-24 ENCOUNTER — Ambulatory Visit (INDEPENDENT_AMBULATORY_CARE_PROVIDER_SITE_OTHER): Payer: Medicare Other | Admitting: Family Medicine

## 2013-11-24 ENCOUNTER — Encounter: Payer: Self-pay | Admitting: Family Medicine

## 2013-11-24 VITALS — BP 122/74 | HR 64 | Temp 97.6°F | Resp 18 | Ht 67.5 in | Wt 183.0 lb

## 2013-11-24 DIAGNOSIS — E785 Hyperlipidemia, unspecified: Secondary | ICD-10-CM

## 2013-11-24 DIAGNOSIS — K754 Autoimmune hepatitis: Secondary | ICD-10-CM | POA: Diagnosis not present

## 2013-11-24 LAB — COMPLETE METABOLIC PANEL WITH GFR
ALT: 11 U/L (ref 0–35)
AST: 18 U/L (ref 0–37)
Albumin: 4.4 g/dL (ref 3.5–5.2)
Alkaline Phosphatase: 48 U/L (ref 39–117)
BUN: 15 mg/dL (ref 6–23)
CHLORIDE: 108 meq/L (ref 96–112)
CO2: 25 meq/L (ref 19–32)
CREATININE: 0.8 mg/dL (ref 0.50–1.10)
Calcium: 9.5 mg/dL (ref 8.4–10.5)
GFR, EST AFRICAN AMERICAN: 88 mL/min
GFR, EST NON AFRICAN AMERICAN: 77 mL/min
GLUCOSE: 90 mg/dL (ref 70–99)
Potassium: 4.3 mEq/L (ref 3.5–5.3)
Sodium: 141 mEq/L (ref 135–145)
Total Bilirubin: 0.6 mg/dL (ref 0.2–1.2)
Total Protein: 6.6 g/dL (ref 6.0–8.3)

## 2013-11-24 LAB — LIPID PANEL
Cholesterol: 103 mg/dL (ref 0–200)
HDL: 33 mg/dL — ABNORMAL LOW (ref >39)
LDL Cholesterol: 52 mg/dL (ref 0–99)
TRIGLYCERIDES: 90 mg/dL (ref <150)
Total CHOL/HDL Ratio: 3.1 Ratio
VLDL: 18 mg/dL (ref 0–40)

## 2013-11-24 MED ORDER — IPRATROPIUM BROMIDE 0.06 % NA SOLN: 1.0000 | Freq: Three times a day (TID) | NASAL | Status: DC

## 2013-11-24 NOTE — Progress Notes (Signed)
Subjective:    Patient ID: Veronica Flores, female    DOB: 10-26-1945, 68 y.o.   MRN: [id number]  HPI 05/27/13 At the patient's last office visit in August she was found to have a fasting blood sugar of 114.  She has a strong family history of diabetes. She denies any polyuria polydipsia or blurry vision beyond her baseline visual changes after her meningioma. She also has hyperlipidemia for which he takes pravastatin 40 mg by mouth daily. The no myalgias or right upper quadrant pain. She also has a history of autoimmune hepatitis. There is no evidence of jaundice or abdominal pain. She does complain of some mild pain in her left shoulder with abduction greater than 90. Otherwise she is doing well. She is due for Prevnar 13.  At that time, my plan was: 1. HLD (hyperlipidemia) Check fasting lipid panel. Go LDL is now less than 100 based on her hyperglycemia. - COMPLETE METABOLIC PANEL WITH GFR - CBC with Differential - Lipid panel  2. Hyperglycemia Repeat fasting blood sugar today and also check a hemoglobin A1c. I recommended a low carbohydrate diet and trying to increase aerobic exercise to 30 minutes a day 5 days a week. - Hemoglobin A1c 11/24/13 Patient is here today for regular medical followup. She denies any medical concerns. She is due for her six-month fasting lipid panel along with CMP. She denies any myalgia or right upper quadrant pain. Unfortunately, she reports fatigue. She is caring for her mother who has dementia and this is causing a tremendous stress in her life. She is also complaining of some mild pain in her left ankle which is worse with standing. Past Medical History  Diagnosis Date  . Hypertension   . Allergy   . Asthma   . Fatigue   . Hypertrophic cardiomyopathy   . Autoimmune hepatitis   . Migraine, ophthalmoplegic   . Osteopenia    Past Surgical History  Procedure Laterality Date  . Cholecystectomy    . Ankle surgery    . Wrist surgery    . Brain surgery      Current Outpatient Prescriptions on File Prior to Visit  Medication Sig Dispense Refill  . aspirin 325 MG tablet Take 325 mg by mouth daily.      Marland Kitchen azaTHIOprine (IMURAN) 50 MG tablet Take 50 mg by mouth daily. 2 pills once a day      . Calcium Carbonate-Vitamin D (CALTRATE 600+D) 600-400 MG-UNIT per chew tablet Chew 1 tablet by mouth 2 (two) times daily.        . cholecalciferol (VITAMIN D) 1000 UNITS tablet Take 1,000 Units by mouth daily.      . Fluticasone-Salmeterol (ADVAIR DISKUS) 100-50 MCG/DOSE AEPB Inhale 1 puff into the lungs every 12 (twelve) hours.  3 each  3  . ipratropium (ATROVENT) 0.06 % nasal spray Place 1 spray into the nose 3 (three) times daily.       . meclizine (ANTIVERT) 32 MG tablet Take 1 tablet (32 mg total) by mouth 3 (three) times daily as needed.  90 tablet  0  . montelukast (SINGULAIR) 10 MG tablet Take 1 tablet (10 mg total) by mouth at bedtime.  90 tablet  4  . pravastatin (PRAVACHOL) 40 MG tablet Take 1 tablet (40 mg total) by mouth daily.  90 tablet  3  . verapamil (CALAN-SR) 240 MG CR tablet Take 1 tablet (240 mg total) by mouth daily.  90 tablet  4   No current facility-administered  medications on file prior to visit.   Allergies  Allergen Reactions  . Penicillins Anaphylaxis  . Allegra [Fexofenadine Hydrochloride]     palpitations  . Desloratadine     Palpitations   . Cephalexin Hives and Rash   History   Social History  . Marital Status: Married    Spouse Name: N/A    Number of Children: N/A  . Years of Education: N/A   Occupational History  . Not on file.   Social History Main Topics  . Smoking status: Never Smoker   . Smokeless tobacco: Never Used  . Alcohol Use: No  . Drug Use: No  . Sexual Activity: Not on file   Other Topics Concern  . Not on file   Social History Narrative  . No narrative on file      Review of Systems  All other systems reviewed and are negative.      Objective:   Physical Exam  Vitals  reviewed. Constitutional: She appears well-developed and well-nourished.  HENT:  Mouth/Throat: Oropharynx is clear and moist.  Eyes: Conjunctivae are normal. No scleral icterus.  Neck: Neck supple. No thyromegaly present.  Cardiovascular: Normal rate, regular rhythm and normal heart sounds.   No murmur heard. Pulmonary/Chest: Effort normal and breath sounds normal. No respiratory distress. She has no wheezes. She has no rales.  Abdominal: Soft. Bowel sounds are normal. She exhibits no distension. There is no tenderness. There is no rebound and no guarding.  Musculoskeletal: She exhibits no edema.  Lymphadenopathy:    She has no cervical adenopathy.          Assessment & Plan:  1. HLD (hyperlipidemia) Clinically the patient is doing well. I will check a CMP and fasting lipid panel. Goal LDL is less than 130. - COMPLETE METABOLIC PANEL WITH GFR - Lipid panel  2. Autoimmune hepatitis

## 2013-11-25 ENCOUNTER — Encounter: Payer: Self-pay | Admitting: *Deleted

## 2013-11-28 ENCOUNTER — Telehealth: Payer: Self-pay | Admitting: Family Medicine

## 2013-11-28 MED ORDER — CIPROFLOXACIN HCL 500 MG PO TABS: 500.0000 mg | ORAL_TABLET | Freq: Two times a day (BID) | ORAL | Status: DC

## 2013-11-28 NOTE — Telephone Encounter (Signed)
Spoke to provider and he wants to prescribe Cipro 500mg  BID X 3 days, pt is aware and is being called to [address] in [place]

## 2013-11-28 NOTE — Telephone Encounter (Signed)
Patient was just here last week and wants to know if she can just come in and leave a urine sample since she was just here please call her back at  [phone]

## 2014-01-26 DIAGNOSIS — Z9889 Other specified postprocedural states: Secondary | ICD-10-CM | POA: Diagnosis not present

## 2014-01-26 DIAGNOSIS — J329 Chronic sinusitis, unspecified: Secondary | ICD-10-CM | POA: Diagnosis not present

## 2014-01-26 DIAGNOSIS — Z86011 Personal history of benign neoplasm of the brain: Secondary | ICD-10-CM | POA: Diagnosis not present

## 2014-01-26 DIAGNOSIS — J328 Other chronic sinusitis: Secondary | ICD-10-CM | POA: Diagnosis not present

## 2014-01-26 DIAGNOSIS — D320 Benign neoplasm of cerebral meninges: Secondary | ICD-10-CM | POA: Diagnosis not present

## 2014-02-09 ENCOUNTER — Ambulatory Visit (INDEPENDENT_AMBULATORY_CARE_PROVIDER_SITE_OTHER): Payer: Medicare Other | Admitting: *Deleted

## 2014-02-09 DIAGNOSIS — Z23 Encounter for immunization: Secondary | ICD-10-CM

## 2014-05-26 ENCOUNTER — Encounter: Payer: Self-pay | Admitting: Family Medicine

## 2014-05-26 ENCOUNTER — Ambulatory Visit (INDEPENDENT_AMBULATORY_CARE_PROVIDER_SITE_OTHER): Payer: Medicare Other | Admitting: Family Medicine

## 2014-05-26 VITALS — BP 128/62 | HR 64 | Temp 97.7°F | Resp 16 | Ht 67.5 in | Wt 176.0 lb

## 2014-05-26 DIAGNOSIS — E785 Hyperlipidemia, unspecified: Secondary | ICD-10-CM | POA: Diagnosis not present

## 2014-05-26 DIAGNOSIS — B029 Zoster without complications: Secondary | ICD-10-CM

## 2014-05-26 DIAGNOSIS — D485 Neoplasm of uncertain behavior of skin: Secondary | ICD-10-CM | POA: Diagnosis not present

## 2014-05-26 LAB — COMPLETE METABOLIC PANEL WITH GFR
ALT: 12 U/L (ref 0–35)
AST: 21 U/L (ref 0–37)
Albumin: 4.7 g/dL (ref 3.5–5.2)
Alkaline Phosphatase: 59 U/L (ref 39–117)
BILIRUBIN TOTAL: 0.7 mg/dL (ref 0.2–1.2)
BUN: 13 mg/dL (ref 6–23)
CALCIUM: 9.6 mg/dL (ref 8.4–10.5)
CO2: 20 mEq/L (ref 19–32)
CREATININE: 0.82 mg/dL (ref 0.50–1.10)
Chloride: 107 mEq/L (ref 96–112)
GFR, EST AFRICAN AMERICAN: 85 mL/min
GFR, Est Non African American: 74 mL/min
Glucose, Bld: 92 mg/dL (ref 70–99)
POTASSIUM: 4.2 meq/L (ref 3.5–5.3)
Sodium: 141 mEq/L (ref 135–145)
Total Protein: 7.2 g/dL (ref 6.0–8.3)

## 2014-05-26 LAB — LIPID PANEL
CHOL/HDL RATIO: 2.7 ratio
CHOLESTEROL: 117 mg/dL (ref 0–200)
HDL: 43 mg/dL (ref >39)
LDL Cholesterol: 57 mg/dL (ref 0–99)
Triglycerides: 85 mg/dL (ref <150)
VLDL: 17 mg/dL (ref 0–40)

## 2014-05-26 LAB — CBC WITH DIFFERENTIAL/PLATELET
BASOS PCT: 0 % (ref 0–1)
Basophils Absolute: 0 10*3/uL (ref 0.0–0.1)
EOS ABS: 0 10*3/uL (ref 0.0–0.7)
Eosinophils Relative: 0 % (ref 0–5)
HCT: 45.6 % (ref 36.0–46.0)
Hemoglobin: 15.1 g/dL — ABNORMAL HIGH (ref 12.0–15.0)
Lymphocytes Relative: 26 % (ref 12–46)
Lymphs Abs: 1 10*3/uL (ref 0.7–4.0)
MCH: 29.3 pg (ref 26.0–34.0)
MCHC: 33.1 g/dL (ref 30.0–36.0)
MCV: 88.4 fL (ref 78.0–100.0)
MONO ABS: 0.2 10*3/uL (ref 0.1–1.0)
MPV: 9.3 fL (ref 8.6–12.4)
Monocytes Relative: 6 % (ref 3–12)
NEUTROS PCT: 68 % (ref 43–77)
Neutro Abs: 2.7 10*3/uL (ref 1.7–7.7)
PLATELETS: 309 10*3/uL (ref 150–400)
RBC: 5.16 MIL/uL — ABNORMAL HIGH (ref 3.87–5.11)
RDW: 15.5 % (ref 11.5–15.5)
WBC: 3.9 10*3/uL — AB (ref 4.0–10.5)

## 2014-05-26 NOTE — Progress Notes (Signed)
Subjective:    Patient ID: Veronica Flores, female    DOB: 12-Nov-1945, 69 y.o.   MRN: [id number]  HPI I saw the patient at home on Saturday for a rash that developed on the right side of her neck. It was a linear cluster of erythematous papules which were burning and painful but began on the right occiput and ran down the right side of her neck to the posterior right shoulder. This was consistent with shingles and I began the patient on Valtrex 1 g by mouth 3 times a day for 7 days. The rash is now improving and she feels much better. She is here for general checkup. She has a history of autoimmune hepatitis as well as hyperlipidemia and hypertension. Her blood pressures well controlled today at 128/62. She denies any chest pain shortness of breath or dyspnea on exertion. She is due for fasting lipid panel as well as a CMP. Past Medical History  Diagnosis Date  . Hypertension   . Allergy   . Asthma   . Fatigue   . Hypertrophic cardiomyopathy   . Autoimmune hepatitis   . Migraine, ophthalmoplegic   . Osteopenia    Past Surgical History  Procedure Laterality Date  . Cholecystectomy    . Ankle surgery    . Wrist surgery    . Brain surgery     Current Outpatient Prescriptions on File Prior to Visit  Medication Sig Dispense Refill  . aspirin 325 MG tablet Take 325 mg by mouth daily.    Marland Kitchen azaTHIOprine (IMURAN) 50 MG tablet Take 50 mg by mouth daily. 2 pills once a day    . Calcium Carbonate-Vitamin D (CALTRATE 600+D) 600-400 MG-UNIT per chew tablet Chew 1 tablet by mouth 2 (two) times daily.      . cholecalciferol (VITAMIN D) 1000 UNITS tablet Take 1,000 Units by mouth daily.    . Fluticasone-Salmeterol (ADVAIR DISKUS) 100-50 MCG/DOSE AEPB Inhale 1 puff into the lungs every 12 (twelve) hours. 3 each 3  . ipratropium (ATROVENT) 0.06 % nasal spray Place 1 spray into the nose 3 (three) times daily. 45 mL 3  . meclizine (ANTIVERT) 32 MG tablet Take 1 tablet (32 mg total) by mouth 3 (three)  times daily as needed. 90 tablet 0  . montelukast (SINGULAIR) 10 MG tablet Take 1 tablet (10 mg total) by mouth at bedtime. 90 tablet 4  . pravastatin (PRAVACHOL) 40 MG tablet Take 1 tablet (40 mg total) by mouth daily. 90 tablet 3  . verapamil (CALAN-SR) 240 MG CR tablet Take 1 tablet (240 mg total) by mouth daily. 90 tablet 4   No current facility-administered medications on file prior to visit.   Allergies  Allergen Reactions  . Penicillins Anaphylaxis  . Allegra [Fexofenadine Hydrochloride]     palpitations  . Desloratadine     Palpitations   . Cephalexin Hives and Rash   History   Social History  . Marital Status: Married    Spouse Name: N/A    Number of Children: N/A  . Years of Education: N/A   Occupational History  . Not on file.   Social History Main Topics  . Smoking status: Never Smoker   . Smokeless tobacco: Never Used  . Alcohol Use: No  . Drug Use: No  . Sexual Activity: Not on file   Other Topics Concern  . Not on file   Social History Narrative      Review of Systems  All other systems reviewed  and are negative.      Objective:   Physical Exam  Constitutional: She appears well-developed and well-nourished. No distress.  Cardiovascular: Normal rate, regular rhythm, normal heart sounds and intact distal pulses.   No murmur heard. Pulmonary/Chest: Effort normal and breath sounds normal. No respiratory distress. She has no wheezes. She has no rales.  Abdominal: Soft. Bowel sounds are normal. She exhibits no distension. There is no tenderness. There is no rebound and no guarding.  Skin: Rash noted. She is not diaphoretic.  Vitals reviewed. On the dorsum of her right forearm there is a 1 cm erythematous scaly white papule that has been there for several months.        Assessment & Plan:  HLD (hyperlipidemia) - Plan: CBC with Differential/Platelet, COMPLETE METABOLIC PANEL WITH GFR, Lipid panel  Shingles  Neoplasm of uncertain behavior of  skin  I will check a CMP as well as fasting lipid panel. Goal LDL cholesterol is less than 130. Her blood pressure is at goal. Her shingles seems to be resolving on the Valtrex without complications. 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 We determined to treat that. The lesion was treated with cryotherapy using liquid nitrogen for a total of 30 seconds.

## 2014-05-30 ENCOUNTER — Encounter: Payer: Self-pay | Admitting: Family Medicine

## 2014-06-12 ENCOUNTER — Telehealth: Payer: Self-pay | Admitting: Family Medicine

## 2014-06-12 NOTE — Telephone Encounter (Signed)
Patient thinks she may have shingles again on the other side, from last time, please call her at [phone]

## 2014-06-13 ENCOUNTER — Ambulatory Visit (INDEPENDENT_AMBULATORY_CARE_PROVIDER_SITE_OTHER): Payer: Medicare Other | Admitting: Family Medicine

## 2014-06-13 ENCOUNTER — Encounter: Payer: Self-pay | Admitting: Family Medicine

## 2014-06-13 VITALS — BP 108/70 | HR 64 | Temp 97.6°F | Resp 16 | Ht 67.5 in | Wt 176.0 lb

## 2014-06-13 DIAGNOSIS — B029 Zoster without complications: Secondary | ICD-10-CM

## 2014-06-13 DIAGNOSIS — F411 Generalized anxiety disorder: Secondary | ICD-10-CM | POA: Diagnosis not present

## 2014-06-13 MED ORDER — VALACYCLOVIR HCL 1 G PO TABS: 1000.0000 mg | ORAL_TABLET | Freq: Three times a day (TID) | ORAL | Status: DC

## 2014-06-13 MED ORDER — ALPRAZOLAM 0.25 MG PO TABS: 0.2500 mg | ORAL_TABLET | Freq: Two times a day (BID) | ORAL | Status: DC | PRN

## 2014-06-13 NOTE — Telephone Encounter (Signed)
appt scheduled for today

## 2014-06-13 NOTE — Progress Notes (Signed)
Subjective:    Patient ID: Veronica Flores, female    DOB: 11/16/1945, 69 y.o.   MRN: [id number]  HPI  Patient recently had shingles on the right side. Beginning yesterday she developed a erythematous papular rash originating in the center of her neck radiating down her superior left shoulder onto her left arm. It is burning and itching. It has the characteristic appearance of shingles. She is under tremendous stress caring for her mother with severe dementia. She is having episodic anxiety. She is requesting something to help treat her anxiety. I believe the combination of stress and also her chronic immunosuppressive Imuran may be the reason why she has had recurrent outbreaks of shingles. Past Medical History  Diagnosis Date  . Hypertension   . Allergy   . Asthma   . Fatigue   . Hypertrophic cardiomyopathy   . Autoimmune hepatitis   . Migraine, ophthalmoplegic   . Osteopenia    Past Surgical History  Procedure Laterality Date  . Cholecystectomy    . Ankle surgery    . Wrist surgery    . Brain surgery     Current Outpatient Prescriptions on File Prior to Visit  Medication Sig Dispense Refill  . aspirin 325 MG tablet Take 325 mg by mouth daily.    Marland Kitchen azaTHIOprine (IMURAN) 50 MG tablet Take 50 mg by mouth daily. 2 pills once a day    . Calcium Carbonate-Vitamin D (CALTRATE 600+D) 600-400 MG-UNIT per chew tablet Chew 1 tablet by mouth 2 (two) times daily.      . cholecalciferol (VITAMIN D) 1000 UNITS tablet Take 1,000 Units by mouth daily.    . Fluticasone-Salmeterol (ADVAIR DISKUS) 100-50 MCG/DOSE AEPB Inhale 1 puff into the lungs every 12 (twelve) hours. 3 each 3  . ipratropium (ATROVENT) 0.06 % nasal spray Place 1 spray into the nose 3 (three) times daily. 45 mL 3  . montelukast (SINGULAIR) 10 MG tablet Take 1 tablet (10 mg total) by mouth at bedtime. 90 tablet 4  . pravastatin (PRAVACHOL) 40 MG tablet Take 1 tablet (40 mg total) by mouth daily. 90 tablet 3  . verapamil  (CALAN-SR) 240 MG CR tablet Take 1 tablet (240 mg total) by mouth daily. 90 tablet 4   No current facility-administered medications on file prior to visit.   Allergies  Allergen Reactions  . Penicillins Anaphylaxis  . Allegra [Fexofenadine Hydrochloride]     palpitations  . Desloratadine     Palpitations   . Cephalexin Hives and Rash   History   Social History  . Marital Status: Married    Spouse Name: N/A  . Number of Children: N/A  . Years of Education: N/A   Occupational History  . Not on file.   Social History Main Topics  . Smoking status: Never Smoker   . Smokeless tobacco: Never Used  . Alcohol Use: No  . Drug Use: No  . Sexual Activity: Not on file   Other Topics Concern  . Not on file   Social History Narrative     Review of Systems  All other systems reviewed and are negative.      Objective:   Physical Exam  Cardiovascular: Normal rate, regular rhythm and normal heart sounds.   Pulmonary/Chest: Effort normal and breath sounds normal.  Skin: Rash noted. There is erythema.  Vitals reviewed.         Assessment & Plan:  Shingles rash - Plan: valACYclovir (VALTREX) 1000 MG tablet  Anxiety state -  Plan: ALPRAZolam (XANAX) 0.25 MG tablet  Begin Valtrex 1 g by mouth 3 times a day for 7 days. The patient can also use Xanax 0.25 mg every 8 hours as needed for situational anxiety. Recheck in one week if no better or immediately if worse.

## 2014-07-21 ENCOUNTER — Encounter: Payer: Self-pay | Admitting: Family Medicine

## 2014-07-21 DIAGNOSIS — Z1231 Encounter for screening mammogram for malignant neoplasm of breast: Secondary | ICD-10-CM | POA: Diagnosis not present

## 2014-07-21 LAB — HM MAMMOGRAPHY: HM MAMMO: NORMAL

## 2014-08-09 ENCOUNTER — Other Ambulatory Visit: Payer: Self-pay | Admitting: Family Medicine

## 2014-10-24 DIAGNOSIS — K754 Autoimmune hepatitis: Secondary | ICD-10-CM | POA: Diagnosis not present

## 2014-11-03 DIAGNOSIS — K754 Autoimmune hepatitis: Secondary | ICD-10-CM | POA: Diagnosis not present

## 2014-11-08 DIAGNOSIS — H524 Presbyopia: Secondary | ICD-10-CM | POA: Diagnosis not present

## 2014-11-08 DIAGNOSIS — H2513 Age-related nuclear cataract, bilateral: Secondary | ICD-10-CM | POA: Diagnosis not present

## 2014-11-08 DIAGNOSIS — H5340 Unspecified visual field defects: Secondary | ICD-10-CM | POA: Diagnosis not present

## 2014-11-08 DIAGNOSIS — H5211 Myopia, right eye: Secondary | ICD-10-CM | POA: Diagnosis not present

## 2014-11-21 ENCOUNTER — Other Ambulatory Visit: Payer: Self-pay | Admitting: Family Medicine

## 2014-11-24 ENCOUNTER — Ambulatory Visit (INDEPENDENT_AMBULATORY_CARE_PROVIDER_SITE_OTHER): Payer: Medicare Other | Admitting: Family Medicine

## 2014-11-24 ENCOUNTER — Encounter: Payer: Self-pay | Admitting: Family Medicine

## 2014-11-24 VITALS — BP 130/70 | HR 74 | Temp 97.7°F | Resp 16 | Ht 67.5 in | Wt 173.0 lb

## 2014-11-24 DIAGNOSIS — E785 Hyperlipidemia, unspecified: Secondary | ICD-10-CM

## 2014-11-24 DIAGNOSIS — R490 Dysphonia: Secondary | ICD-10-CM

## 2014-11-24 DIAGNOSIS — R1314 Dysphagia, pharyngoesophageal phase: Secondary | ICD-10-CM | POA: Diagnosis not present

## 2014-11-24 LAB — COMPLETE METABOLIC PANEL WITH GFR
ALBUMIN: 4.1 g/dL (ref 3.6–5.1)
ALT: 15 U/L (ref 6–29)
AST: 22 U/L (ref 10–35)
Alkaline Phosphatase: 47 U/L (ref 33–130)
BUN: 16 mg/dL (ref 7–25)
CALCIUM: 9.4 mg/dL (ref 8.6–10.4)
CHLORIDE: 109 mmol/L (ref 98–110)
CO2: 23 mmol/L (ref 20–31)
Creat: 0.76 mg/dL (ref 0.50–0.99)
GFR, EST NON AFRICAN AMERICAN: 81 mL/min (ref >=60)
GLUCOSE: 88 mg/dL (ref 70–99)
Potassium: 4.3 mmol/L (ref 3.5–5.3)
Sodium: 142 mmol/L (ref 135–146)
Total Bilirubin: 0.6 mg/dL (ref 0.2–1.2)
Total Protein: 6.6 g/dL (ref 6.1–8.1)

## 2014-11-24 LAB — CBC WITH DIFFERENTIAL/PLATELET
Basophils Absolute: 0 10*3/uL (ref 0.0–0.1)
Basophils Relative: 0 % (ref 0–1)
EOS ABS: 0 10*3/uL (ref 0.0–0.7)
EOS PCT: 0 % (ref 0–5)
HCT: 43.4 % (ref 36.0–46.0)
Hemoglobin: 14.4 g/dL (ref 12.0–15.0)
LYMPHS ABS: 1.2 10*3/uL (ref 0.7–4.0)
LYMPHS PCT: 25 % (ref 12–46)
MCH: 29.1 pg (ref 26.0–34.0)
MCHC: 33.2 g/dL (ref 30.0–36.0)
MCV: 87.9 fL (ref 78.0–100.0)
MPV: 8.9 fL (ref 8.6–12.4)
Monocytes Absolute: 0.3 10*3/uL (ref 0.1–1.0)
Monocytes Relative: 6 % (ref 3–12)
Neutro Abs: 3.3 10*3/uL (ref 1.7–7.7)
Neutrophils Relative %: 69 % (ref 43–77)
Platelets: 325 10*3/uL (ref 150–400)
RBC: 4.94 MIL/uL (ref 3.87–5.11)
RDW: 15.4 % (ref 11.5–15.5)
WBC: 4.8 10*3/uL (ref 4.0–10.5)

## 2014-11-24 LAB — LIPID PANEL
CHOLESTEROL: 115 mg/dL — AB (ref 125–200)
HDL: 36 mg/dL — ABNORMAL LOW (ref >=46)
LDL Cholesterol: 63 mg/dL (ref <130)
Total CHOL/HDL Ratio: 3.2 Ratio (ref <=5.0)
Triglycerides: 81 mg/dL (ref <150)
VLDL: 16 mg/dL (ref <30)

## 2014-11-24 NOTE — Progress Notes (Signed)
Subjective:    Patient ID: Veronica Flores, female    DOB: 1946-01-01, 69 y.o.   MRN: [id number]  HPI  Patient is a very pleasant 69 year old white female who is here today for follow-up of her hyperlipidemia. Her blood pressures well controlled today at 130/70. She denies any myalgias or right upper quadrant pain. She is due to recheck her cholesterol. Unfortunately over the last 3 weeks she has had persistent hoarseness. She denies any upper respiratory symptoms. She denies any postnasal drip. She also reports dysphagia and pain just below the sternum. She states it feels like food sometimes gets stuck in her distal esophagus. She denies any melanoma or hematochezia Past Medical History  Diagnosis Date  . Hypertension   . Allergy   . Asthma   . Fatigue   . Hypertrophic cardiomyopathy   . Autoimmune hepatitis   . Migraine, ophthalmoplegic   . Osteopenia    Past Surgical History  Procedure Laterality Date  . Cholecystectomy    . Ankle surgery    . Wrist surgery    . Brain surgery     Current Outpatient Prescriptions on File Prior to Visit  Medication Sig Dispense Refill  . ADVAIR DISKUS 100-50 MCG/DOSE AEPB USE 1 INHALATION BY MOUTH INTO THE LUNGS EVERY 12 HOURS 180 each 3  . ALPRAZolam (XANAX) 0.25 MG tablet Take 1 tablet (0.25 mg total) by mouth 2 (two) times daily as needed for anxiety. 20 tablet 0  . aspirin 325 MG tablet Take 325 mg by mouth daily.    Marland Kitchen azaTHIOprine (IMURAN) 50 MG tablet Take 50 mg by mouth daily. 2 pills once a day    . Calcium Carbonate-Vitamin D (CALTRATE 600+D) 600-400 MG-UNIT per chew tablet Chew 1 tablet by mouth 2 (two) times daily.      . cholecalciferol (VITAMIN D) 1000 UNITS tablet Take 1,000 Units by mouth daily.    Marland Kitchen ipratropium (ATROVENT) 0.06 % nasal spray Place 1 spray into the nose 3 (three) times daily. 45 mL 3  . montelukast (SINGULAIR) 10 MG tablet TAKE 1 BY MOUTH AT BEDTIME 90 tablet 3  . pravastatin (PRAVACHOL) 40 MG tablet TAKE 1 BY  MOUTH DAILY 90 tablet 3  . verapamil (CALAN-SR) 240 MG CR tablet TAKE 1 BY MOUTH DAILY 90 tablet 3   No current facility-administered medications on file prior to visit.   Allergies  Allergen Reactions  . Penicillins Anaphylaxis  . Allegra [Fexofenadine Hydrochloride]     palpitations  . Desloratadine     Palpitations   . Cephalexin Hives and Rash   History   Social History  . Marital Status: Married    Spouse Name: N/A  . Number of Children: N/A  . Years of Education: N/A   Occupational History  . Not on file.   Social History Main Topics  . Smoking status: Never Smoker   . Smokeless tobacco: Never Used  . Alcohol Use: No  . Drug Use: No  . Sexual Activity: Not on file   Other Topics Concern  . Not on file   Social History Narrative     Review of Systems  All other systems reviewed and are negative.      Objective:   Physical Exam  HENT:  Nose: Nose normal.  Mouth/Throat: Oropharynx is clear and moist. No oropharyngeal exudate.  Neck: Neck supple.  Cardiovascular: Normal rate, regular rhythm and normal heart sounds.   No murmur heard. Pulmonary/Chest: Effort normal and breath sounds normal. No  respiratory distress. She has no wheezes. She has no rales.  Abdominal: Soft. Bowel sounds are normal. She exhibits no distension. There is no tenderness. There is no rebound and no guarding.  Musculoskeletal: She exhibits no edema.  Lymphadenopathy:    She has no cervical adenopathy.  Vitals reviewed.         Assessment & Plan:  HLD (hyperlipidemia) - Plan: COMPLETE METABOLIC PANEL WITH GFR, CBC with Differential/Platelet, Lipid panel  Dysphagia, pharyngoesophageal phase  Hoarseness of voice  I will check a fasting lipid panel to monitor her cholesterol as well as her liver function test. I'm concerned that the 2 other symptoms she is having may be related to silent acid reflux. Therefore I'll start the patient on Nexium. I gave her samples to last 3  weeks. If symptoms are not improving after this, I would consider a laryngoscopy versus an EGD to further evaluate her symptoms

## 2014-11-27 ENCOUNTER — Encounter: Payer: Self-pay | Admitting: Family Medicine

## 2014-12-12 DIAGNOSIS — D125 Benign neoplasm of sigmoid colon: Secondary | ICD-10-CM | POA: Diagnosis not present

## 2014-12-12 DIAGNOSIS — Z800 Family history of malignant neoplasm of digestive organs: Secondary | ICD-10-CM | POA: Diagnosis not present

## 2014-12-12 DIAGNOSIS — Z8371 Family history of colonic polyps: Secondary | ICD-10-CM | POA: Diagnosis not present

## 2014-12-12 DIAGNOSIS — K641 Second degree hemorrhoids: Secondary | ICD-10-CM | POA: Diagnosis not present

## 2014-12-12 DIAGNOSIS — K635 Polyp of colon: Secondary | ICD-10-CM | POA: Diagnosis not present

## 2014-12-12 DIAGNOSIS — D124 Benign neoplasm of descending colon: Secondary | ICD-10-CM | POA: Diagnosis not present

## 2014-12-12 DIAGNOSIS — Z86010 Personal history of colonic polyps: Secondary | ICD-10-CM | POA: Diagnosis not present

## 2014-12-12 DIAGNOSIS — D120 Benign neoplasm of cecum: Secondary | ICD-10-CM | POA: Diagnosis not present

## 2014-12-12 LAB — HM COLONOSCOPY

## 2014-12-15 ENCOUNTER — Ambulatory Visit
Admission: RE | Admit: 2014-12-15 | Discharge: 2014-12-15 | Disposition: A | Discharge status: Home / Self Care | Payer: Medicare Other | Source: Ambulatory Visit | Attending: Family Medicine | Admitting: Family Medicine

## 2014-12-15 ENCOUNTER — Encounter: Payer: Self-pay | Admitting: Family Medicine

## 2014-12-15 ENCOUNTER — Ambulatory Visit (INDEPENDENT_AMBULATORY_CARE_PROVIDER_SITE_OTHER): Payer: Medicare Other | Admitting: Family Medicine

## 2014-12-15 VITALS — BP 134/66 | HR 70 | Temp 98.5°F | Resp 16 | Ht 67.5 in | Wt 171.0 lb

## 2014-12-15 DIAGNOSIS — M79621 Pain in right upper arm: Secondary | ICD-10-CM

## 2014-12-15 DIAGNOSIS — M79601 Pain in right arm: Secondary | ICD-10-CM | POA: Diagnosis not present

## 2014-12-15 DIAGNOSIS — S4991XA Unspecified injury of right shoulder and upper arm, initial encounter: Secondary | ICD-10-CM | POA: Diagnosis not present

## 2014-12-15 DIAGNOSIS — S63502A Unspecified sprain of left wrist, initial encounter: Secondary | ICD-10-CM | POA: Diagnosis not present

## 2014-12-15 DIAGNOSIS — M25511 Pain in right shoulder: Secondary | ICD-10-CM | POA: Diagnosis not present

## 2014-12-15 DIAGNOSIS — M25521 Pain in right elbow: Secondary | ICD-10-CM

## 2014-12-15 NOTE — Patient Instructions (Addendum)
Biofreeze or asprecreme to shoulder/arm  Get the xray done  [place] for [employer] the hand/shoulder 20 minutes  F/U as needed

## 2014-12-15 NOTE — Progress Notes (Signed)
Patient ID: Veronica Flores, female   DOB: 1945-11-29, 69 y.o.   MRN: [id number]   Subjective:    Patient ID: Veronica Flores, female    DOB: 02-02-1946, 69 y.o.   MRN: [id number]  Patient presents for S/P Fall  patient is status post fall this morning she was helping her mother when she tripped over her walker and landed which she believes with her arm stretched out she did not lose consciousness did not hit her head she now has severe pain in her right shoulder and arm she is concerned as this is it arm that she fracture when she had a fall some years ago. She also has some mild pain in her left thumb and some tenderness in bilateral knees but more for pain is concentrated in the arm. Unfortunately she had polyps removed this week if she is unable to take any NSAIDs she also supposed be off of acetaminophen because of some type of neurology study she is in.    Review Of Systems:  GEN- denies fatigue, fever, weight loss,weakness, recent illness HEENT- denies eye drainage, change in vision, nasal discharge, CVS- denies chest pain, palpitations RESP- denies SOB, cough, wheeze ABD- denies N/V, change in stools, abd pain GU- denies dysuria, hematuria, dribbling, incontinence MSK- + joint pain, muscle aches, injury Neuro- denies headache, dizziness, syncope, seizure activity       Objective:    BP 134/66 mmHg  Pulse 70  Temp(Src) 98.5 F (36.9 C) (Oral)  Resp 16  Ht 5' 7.5" (1.715 m)  Wt 171 lb (77.565 kg)  BMI 26.37 kg/m2 GEN- NAD, alert and oriented x3 Neck- Supple, fair ROM, C spine NT MSK- Decreased ROM right upper ext, +empty can, biceps in tact, pain with raising arm above shoulder level and reaching to  midline of back Lumbar Spine NT, neg SLR Bilat knees- +crepitus, no effusion, fair ROM Left wrist- FROM , mild pain with flexion of wrist and thumb, no bony abnormality , no swelling no no bruising EXT- No edema Pulses- Radial - 2+        Assessment & Plan:      Problem  List Items Addressed This Visit    None    Visit Diagnoses    Wrist sprain, left, initial encounter    -  Primary    Mild sprain, good ROM, no boney abnormality, will use topical biofreeze or aspercreme, ICE    Shoulder injury, right, initial encounter        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 Any transcriptional errors that result from this process are unintentional.

## 2014-12-20 ENCOUNTER — Encounter: Payer: Self-pay | Admitting: Family Medicine

## 2014-12-22 ENCOUNTER — Encounter: Payer: Self-pay | Admitting: Family Medicine

## 2015-01-24 DIAGNOSIS — K641 Second degree hemorrhoids: Secondary | ICD-10-CM | POA: Diagnosis not present

## 2015-01-26 ENCOUNTER — Encounter: Payer: Self-pay | Admitting: Family Medicine

## 2015-01-26 ENCOUNTER — Ambulatory Visit (INDEPENDENT_AMBULATORY_CARE_PROVIDER_SITE_OTHER): Payer: Medicare Other | Admitting: Family Medicine

## 2015-01-26 VITALS — BP 130/60 | HR 76 | Temp 98.3°F | Resp 16 | Ht 67.5 in | Wt 170.0 lb

## 2015-01-26 DIAGNOSIS — R300 Dysuria: Secondary | ICD-10-CM | POA: Diagnosis not present

## 2015-01-26 DIAGNOSIS — N3001 Acute cystitis with hematuria: Secondary | ICD-10-CM

## 2015-01-26 DIAGNOSIS — R309 Painful micturition, unspecified: Secondary | ICD-10-CM | POA: Diagnosis not present

## 2015-01-26 LAB — URINALYSIS, ROUTINE W REFLEX MICROSCOPIC
BILIRUBIN URINE: NEGATIVE
Glucose, UA: NEGATIVE
Ketones, ur: NEGATIVE
Nitrite: NEGATIVE
PH: 5.5 (ref 5.0–8.0)

## 2015-01-26 LAB — URINALYSIS, MICROSCOPIC ONLY
CASTS: NONE SEEN [LPF]
CRYSTALS: NONE SEEN [HPF]

## 2015-01-26 MED ORDER — CIPROFLOXACIN HCL 500 MG PO TABS: 500.0000 mg | ORAL_TABLET | Freq: Two times a day (BID) | ORAL | Status: DC

## 2015-01-26 NOTE — Addendum Note (Signed)
Addended by: Haskel Khan A on: 01/26/2015 12:33 PM   Modules accepted: Orders

## 2015-01-26 NOTE — Progress Notes (Signed)
Subjective:    Patient ID: Veronica Flores, female    DOB: December 10, 1945, 69 y.o.   MRN: [id number]  HPI Patient had a hemorrhoid banded earlier in the week. Ever since that time she has had lower pelvic pressure, dysuria, urinary frequency, urinary urgency, and urinary hesitancy. Urinalysis today shows blood as well as leukocyte esterase. She denies any fevers or chills. She denies any blood in her stool. She denies any black tarry stool. She denies any fever or chills or headache Past Medical History  Diagnosis Date  . Hypertension   . Allergy   . Asthma   . Fatigue   . Hypertrophic cardiomyopathy ([place])   . Autoimmune hepatitis ([place])   . Migraine, ophthalmoplegic   . Osteopenia    Past Surgical History  Procedure Laterality Date  . Cholecystectomy    . Ankle surgery    . Wrist surgery    . Brain surgery     Current Outpatient Prescriptions on File Prior to Visit  Medication Sig Dispense Refill  . ADVAIR DISKUS 100-50 MCG/DOSE AEPB USE 1 INHALATION BY MOUTH INTO THE LUNGS EVERY 12 HOURS 180 each 3  . ALPRAZolam (XANAX) 0.25 MG tablet Take 1 tablet (0.25 mg total) by mouth 2 (two) times daily as needed for anxiety. 20 tablet 0  . aspirin 325 MG tablet Take 325 mg by mouth daily.    Marland Kitchen azaTHIOprine (IMURAN) 50 MG tablet Take 50 mg by mouth daily. 2 pills once a day    . Calcium Carbonate-Vitamin D (CALTRATE 600+D) 600-400 MG-UNIT per chew tablet Chew 1 tablet by mouth 2 (two) times daily.      . cholecalciferol (VITAMIN D) 1000 UNITS tablet Take 1,000 Units by mouth daily.    Marland Kitchen ipratropium (ATROVENT) 0.06 % nasal spray Place 1 spray into the nose 3 (three) times daily. 45 mL 3  . montelukast (SINGULAIR) 10 MG tablet TAKE 1 BY MOUTH AT BEDTIME 90 tablet 3  . pravastatin (PRAVACHOL) 40 MG tablet TAKE 1 BY MOUTH DAILY 90 tablet 3  . verapamil (CALAN-SR) 240 MG CR tablet TAKE 1 BY MOUTH DAILY 90 tablet 3   No current facility-administered medications on file prior to visit.   Allergies   Allergen Reactions  . Penicillins Anaphylaxis  . Allegra [Fexofenadine Hydrochloride]     palpitations  . Desloratadine     Palpitations   . Cephalexin Hives and Rash   Social History   Social History  . Marital Status: Married    Spouse Name: N/A  . Number of Children: N/A  . Years of Education: N/A   Occupational History  . Not on file.   Social History Main Topics  . Smoking status: Never Smoker   . Smokeless tobacco: Never Used  . Alcohol Use: No  . Drug Use: No  . Sexual Activity: Not on file   Other Topics Concern  . Not on file   Social History Narrative      Review of Systems  All other systems reviewed and are negative.      Objective:   Physical Exam  Constitutional: She appears well-developed and well-nourished. No distress.  Cardiovascular: Normal rate, regular rhythm and intact distal pulses.   No murmur heard. Pulmonary/Chest: Effort normal and breath sounds normal. No respiratory distress. She has no wheezes. She has no rales.  Abdominal: Soft. Bowel sounds are normal. She exhibits no distension. There is no tenderness. There is no rebound and no guarding.  Skin: She is not  diaphoretic.  Vitals reviewed.         Assessment & Plan:  Pain with urination - Plan: Urinalysis, Routine w reflex microscopic (not at [facility]), ciprofloxacin (CIPRO) 500 MG tablet  Acute cystitis with hematuria  Patient has a bladder infection. Begin Cipro 500 mg by mouth twice a day for 5 days. Recheck next week if no better or sooner if worse

## 2015-01-29 LAB — URINE CULTURE

## 2015-02-07 ENCOUNTER — Other Ambulatory Visit: Payer: Self-pay | Admitting: Family Medicine

## 2015-02-08 ENCOUNTER — Ambulatory Visit (INDEPENDENT_AMBULATORY_CARE_PROVIDER_SITE_OTHER): Payer: Medicare Other | Admitting: *Deleted

## 2015-02-08 DIAGNOSIS — Z23 Encounter for immunization: Secondary | ICD-10-CM | POA: Diagnosis not present

## 2015-02-21 DIAGNOSIS — K641 Second degree hemorrhoids: Secondary | ICD-10-CM | POA: Diagnosis not present

## 2015-03-07 DIAGNOSIS — K641 Second degree hemorrhoids: Secondary | ICD-10-CM | POA: Diagnosis not present

## 2015-04-26 ENCOUNTER — Ambulatory Visit: Payer: Medicare Other | Admitting: Family Medicine

## 2015-05-03 ENCOUNTER — Ambulatory Visit (INDEPENDENT_AMBULATORY_CARE_PROVIDER_SITE_OTHER): Payer: Medicare Other | Admitting: Physician Assistant

## 2015-05-03 ENCOUNTER — Encounter: Payer: Self-pay | Admitting: Physician Assistant

## 2015-05-03 VITALS — BP 122/70 | HR 76 | Temp 97.0°F | Resp 18 | Wt 172.0 lb

## 2015-05-03 DIAGNOSIS — J988 Other specified respiratory disorders: Secondary | ICD-10-CM

## 2015-05-03 DIAGNOSIS — B9689 Other specified bacterial agents as the cause of diseases classified elsewhere: Principal | ICD-10-CM

## 2015-05-03 MED ORDER — AZITHROMYCIN 250 MG PO TABS: ORAL_TABLET | ORAL | Status: DC

## 2015-05-03 NOTE — Progress Notes (Signed)
Patient ID: MASA BRISKI MRN: [id number], DOB: 20-Jun-1945, 70 y.o. Date of Encounter: 05/03/2015, 3:36 PM    Chief Complaint:  Chief Complaint  Patient presents with  . sick x 2 weeks    cough, sinus/chest congestion     HPI: 70 y.o. year old white female resents with above. Is that she has been sick for about 2 weeks now. Has been using over-the-counter decongestants and cough medicines and was hoping symptoms would resolve but they have not. Having both head and nasal congestion as well as chest congestion. No significant sore throat. No fevers or chills.     Home Meds:   Outpatient Prescriptions Prior to Visit  Medication Sig Dispense Refill  . ADVAIR DISKUS 100-50 MCG/DOSE AEPB USE 1 INHALATION BY MOUTH INTO THE LUNGS EVERY 12 HOURS 180 each 3  . ALPRAZolam (XANAX) 0.25 MG tablet Take 1 tablet (0.25 mg total) by mouth 2 (two) times daily as needed for anxiety. 20 tablet 0  . aspirin 325 MG tablet Take 325 mg by mouth daily.    Marland Kitchen azaTHIOprine (IMURAN) 50 MG tablet Take 50 mg by mouth daily. 2 pills once a day    . Calcium Carbonate-Vitamin D (CALTRATE 600+D) 600-400 MG-UNIT per chew tablet Chew 1 tablet by mouth 2 (two) times daily.      . cholecalciferol (VITAMIN D) 1000 UNITS tablet Take 1,000 Units by mouth daily.    Marland Kitchen ipratropium (ATROVENT) 0.06 % nasal spray USE 1 SPRAY INTO THE NOSE 3 TIMES DAILY 45 mL 5  . montelukast (SINGULAIR) 10 MG tablet TAKE 1 BY MOUTH AT BEDTIME 90 tablet 3  . pravastatin (PRAVACHOL) 40 MG tablet TAKE 1 BY MOUTH DAILY 90 tablet 3  . verapamil (CALAN-SR) 240 MG CR tablet TAKE 1 BY MOUTH DAILY 90 tablet 3  . ciprofloxacin (CIPRO) 500 MG tablet Take 1 tablet (500 mg total) by mouth 2 (two) times daily. 10 tablet 0   No facility-administered medications prior to visit.    Allergies:  Allergies  Allergen Reactions  . Penicillins Anaphylaxis  . Allegra [Fexofenadine Hydrochloride]     palpitations  . Desloratadine     Palpitations   .  Cephalexin Hives and Rash      Review of Systems: See HPI for pertinent ROS. All other ROS negative.    Physical Exam: Blood pressure 122/70, pulse 76, temperature 97 F (36.1 C), temperature source Oral, resp. rate 18, weight 172 lb (78.019 kg)., Body mass index is 26.53 kg/(m^2). General:  WNWD WF. Appears in no acute distress. HEENT: Normocephalic, atraumatic, eyes without discharge, sclera non-icteric, nares are without discharge. Bilateral auditory canals clear, TM's are without perforation, pearly grey and translucent with reflective cone of light bilaterally. Oral cavity moist, posterior pharynx without exudate, erythema, peritonsillar abscess. Minimal tenderness with percussion of frontal and maxillary sinuses.  Neck: Supple. No thyromegaly. No lymphadenopathy. Lungs: Clear bilaterally to auscultation without wheezes, rales, or rhonchi. Breathing is unlabored. Heart: Regular rhythm. No murmurs, rubs, or gallops. Msk:  Strength and tone normal for age. Extremities/Skin: Warm and dry.  Neuro: Alert and oriented X 3. Moves all extremities spontaneously. Gait is normal. CNII-XII grossly in tact. Psych:  Responds to questions appropriately with a normal affect.     ASSESSMENT AND PLAN:  70 y.o. year old female with  1. Bacterial respiratory infection Take antibiotic as directed. Can continue over-the-counter decongestants and cough medications as needed for symptom relief. F/U if symptoms do not resolve within 1 week  after completion of antibiotic. - azithromycin (ZITHROMAX) 250 MG tablet; Day 1: Take 2 daily. Days 2-5: Take 1 daily.  Dispense: 6 tablet; Refill: 0   Signed, [address] [place], [place], [employer] 05/03/2015 3:36 PM

## 2015-05-29 ENCOUNTER — Ambulatory Visit (INDEPENDENT_AMBULATORY_CARE_PROVIDER_SITE_OTHER): Payer: Medicare Other | Admitting: Family Medicine

## 2015-05-29 ENCOUNTER — Encounter: Payer: Self-pay | Admitting: Family Medicine

## 2015-05-29 VITALS — BP 130/64 | HR 68 | Temp 97.7°F | Resp 18 | Wt 172.0 lb

## 2015-05-29 DIAGNOSIS — E785 Hyperlipidemia, unspecified: Secondary | ICD-10-CM

## 2015-05-29 DIAGNOSIS — R5382 Chronic fatigue, unspecified: Secondary | ICD-10-CM

## 2015-05-29 DIAGNOSIS — K754 Autoimmune hepatitis: Secondary | ICD-10-CM | POA: Diagnosis not present

## 2015-05-29 DIAGNOSIS — I421 Obstructive hypertrophic cardiomyopathy: Secondary | ICD-10-CM

## 2015-05-29 DIAGNOSIS — R5383 Other fatigue: Secondary | ICD-10-CM | POA: Diagnosis not present

## 2015-05-29 DIAGNOSIS — E538 Deficiency of other specified B group vitamins: Secondary | ICD-10-CM | POA: Diagnosis not present

## 2015-05-29 LAB — CBC WITH DIFFERENTIAL/PLATELET
Basophils Absolute: 0 10*3/uL (ref 0.0–0.1)
Basophils Relative: 0 % (ref 0–1)
EOS PCT: 0 % (ref 0–5)
Eosinophils Absolute: 0 10*3/uL (ref 0.0–0.7)
HCT: 45 % (ref 36.0–46.0)
Hemoglobin: 14.9 g/dL (ref 12.0–15.0)
LYMPHS ABS: 1.2 10*3/uL (ref 0.7–4.0)
Lymphocytes Relative: 30 % (ref 12–46)
MCH: 29.4 pg (ref 26.0–34.0)
MCHC: 33.1 g/dL (ref 30.0–36.0)
MCV: 88.9 fL (ref 78.0–100.0)
MONO ABS: 0.3 10*3/uL (ref 0.1–1.0)
MPV: 9.1 fL (ref 8.6–12.4)
Monocytes Relative: 8 % (ref 3–12)
NEUTROS ABS: 2.5 10*3/uL (ref 1.7–7.7)
Neutrophils Relative %: 62 % (ref 43–77)
Platelets: 308 10*3/uL (ref 150–400)
RBC: 5.06 MIL/uL (ref 3.87–5.11)
RDW: 15.1 % (ref 11.5–15.5)
WBC: 4 10*3/uL (ref 4.0–10.5)

## 2015-05-29 LAB — COMPLETE METABOLIC PANEL WITH GFR
ALT: 22 U/L (ref 6–29)
AST: 28 U/L (ref 10–35)
Albumin: 4.2 g/dL (ref 3.6–5.1)
Alkaline Phosphatase: 49 U/L (ref 33–130)
BUN: 16 mg/dL (ref 7–25)
CHLORIDE: 108 mmol/L (ref 98–110)
CO2: 24 mmol/L (ref 20–31)
CREATININE: 0.85 mg/dL (ref 0.50–0.99)
Calcium: 9.2 mg/dL (ref 8.6–10.4)
GFR, Est African American: 81 mL/min (ref >=60)
GFR, Est Non African American: 70 mL/min (ref >=60)
Glucose, Bld: 79 mg/dL (ref 70–99)
POTASSIUM: 4.6 mmol/L (ref 3.5–5.3)
Sodium: 141 mmol/L (ref 135–146)
Total Bilirubin: 0.7 mg/dL (ref 0.2–1.2)
Total Protein: 6.7 g/dL (ref 6.1–8.1)

## 2015-05-29 LAB — VITAMIN B12: Vitamin B-12: 273 pg/mL (ref 200–1100)

## 2015-05-29 LAB — LIPID PANEL
CHOLESTEROL: 109 mg/dL — AB (ref 125–200)
HDL: 32 mg/dL — AB (ref >=46)
LDL Cholesterol: 53 mg/dL (ref <130)
TRIGLYCERIDES: 119 mg/dL (ref <150)
Total CHOL/HDL Ratio: 3.4 Ratio (ref <=5.0)
VLDL: 24 mg/dL (ref <30)

## 2015-05-29 LAB — TSH: TSH: 1.25 mIU/L

## 2015-05-29 NOTE — Progress Notes (Signed)
Subjective:    Patient ID: Veronica Flores, female    DOB: January 20, 1946, 70 y.o.   MRN: [id number]  HPI   Patient is a very pleasant 70 year old white female who is here today for follow-up of her hyperlipidemia. She denies any myalgias or right upper quadrant pain. She is due to recheck her cholesterol. PMH is also significant for HOCM.  She denies any palpitations or near syncope.  She denies chest pain, sob, or dyspnea on exertion.  Since I last saw patient, her mother passed away.  She complains of fatigue, which she associates to not sleeping very well. Her husband has Parkinson's disease and frequently talks in his sleep or moves and wakes her up. She was also under a lot of stress caring for her mother which may have contributed to the fatigue. She denies any weight loss. She denies any fevers or bruising or rashes.  DEXA was last in 2015 and was excellent. Past Medical History  Diagnosis Date  . Hypertension   . Allergy   . Asthma   . Fatigue   . Hypertrophic cardiomyopathy ([place])   . Autoimmune hepatitis ([place])   . Migraine, ophthalmoplegic   . Osteopenia    Past Surgical History  Procedure Laterality Date  . Cholecystectomy    . Ankle surgery    . Wrist surgery    . Brain surgery     Current Outpatient Prescriptions on File Prior to Visit  Medication Sig Dispense Refill  . ADVAIR DISKUS 100-50 MCG/DOSE AEPB USE 1 INHALATION BY MOUTH INTO THE LUNGS EVERY 12 HOURS 180 each 3  . ALPRAZolam (XANAX) 0.25 MG tablet Take 1 tablet (0.25 mg total) by mouth 2 (two) times daily as needed for anxiety. 20 tablet 0  . aspirin 325 MG tablet Take 325 mg by mouth daily.    Marland Kitchen azaTHIOprine (IMURAN) 50 MG tablet Take 50 mg by mouth daily. 2 pills once a day    . azithromycin (ZITHROMAX) 250 MG tablet Day 1: Take 2 daily. Days 2-5: Take 1 daily. 6 tablet 0  . Calcium Carbonate-Vitamin D (CALTRATE 600+D) 600-400 MG-UNIT per chew tablet Chew 1 tablet by mouth 2 (two) times daily.      .  cholecalciferol (VITAMIN D) 1000 UNITS tablet Take 1,000 Units by mouth daily.    Marland Kitchen ipratropium (ATROVENT) 0.06 % nasal spray USE 1 SPRAY INTO THE NOSE 3 TIMES DAILY 45 mL 5  . montelukast (SINGULAIR) 10 MG tablet TAKE 1 BY MOUTH AT BEDTIME 90 tablet 3  . pravastatin (PRAVACHOL) 40 MG tablet TAKE 1 BY MOUTH DAILY 90 tablet 3  . verapamil (CALAN-SR) 240 MG CR tablet TAKE 1 BY MOUTH DAILY 90 tablet 3   No current facility-administered medications on file prior to visit.   Allergies  Allergen Reactions  . Penicillins Anaphylaxis  . Allegra [Fexofenadine Hydrochloride]     palpitations  . Desloratadine     Palpitations   . Cephalexin Hives and Rash   Social History   Social History  . Marital Status: Married    Spouse Name: N/A  . Number of Children: N/A  . Years of Education: N/A   Occupational History  . Not on file.   Social History Main Topics  . Smoking status: Never Smoker   . Smokeless tobacco: Never Used  . Alcohol Use: No  . Drug Use: No  . Sexual Activity: Not on file   Other Topics Concern  . Not on file   Social History  Narrative     Review of Systems  All other systems reviewed and are negative.      Objective:   Physical Exam  HENT:  Nose: Nose normal.  Mouth/Throat: Oropharynx is clear and moist. No oropharyngeal exudate.  Neck: Neck supple.  Cardiovascular: Normal rate, regular rhythm and normal heart sounds.   No murmur heard. Pulmonary/Chest: Effort normal and breath sounds normal. No respiratory distress. She has no wheezes. She has no rales.  Abdominal: Soft. Bowel sounds are normal. She exhibits no distension. There is no tenderness. There is no rebound and no guarding.  Musculoskeletal: She exhibits no edema.  Lymphadenopathy:    She has no cervical adenopathy.  Vitals reviewed.         Assessment & Plan:  HLD (hyperlipidemia) - Plan: COMPLETE METABOLIC PANEL WITH GFR, CBC with Differential/Platelet, Lipid panel  Autoimmune  hepatitis ([place]) - Plan: COMPLETE METABOLIC PANEL WITH GFR, CBC with Differential/Platelet  HOCM (hypertrophic obstructive cardiomyopathy) (HCC)  Chronic fatigue - Plan: TSH, Vitamin B12  Her blood pressure today is excellent at 130/64. She is actually gained 2 pounds since I last saw her which in some ways is a reassuring sign. I doubt serious infection or cancer as a cause of her fatigue if she is gaining weight over the last 4 months. I will monitor her liver function tests given her history of autoimmune hepatitis. I will check a CBC to monitor for any anemias or bone marrow suppression on the Imuran. I will also check a TSH as well as a vitamin B12 given her fatigue level. However I believe her fatigue is likely multifactorial and stems from caring for her mother, caring for her husband, and her mother's recent passing. Hopefully this will improve over time. Patient does not appear depressed but that would also be on the differential. She also complains of muscle aches and joint pains in her shoulders and in her knees. However I do not believe that this is related to PMR.

## 2015-05-31 ENCOUNTER — Encounter: Payer: Self-pay | Admitting: Family Medicine

## 2015-06-06 DIAGNOSIS — M8589 Other specified disorders of bone density and structure, multiple sites: Secondary | ICD-10-CM | POA: Diagnosis not present

## 2015-06-06 LAB — HM DEXA SCAN

## 2015-06-20 ENCOUNTER — Encounter: Payer: Self-pay | Admitting: Family Medicine

## 2015-06-22 ENCOUNTER — Encounter: Payer: Self-pay | Admitting: Family Medicine

## 2015-06-22 ENCOUNTER — Telehealth: Payer: Self-pay | Admitting: Family Medicine

## 2015-06-22 NOTE — Telephone Encounter (Signed)
Pt calling because has not heard about Dexa scan done in February.  Nurse had sent her message on "MyChart"  She says she never looks at that.  Given results on bone density.  Made aware of Osteopenia.  Told to take Calcium and Vitamin D supplements as provider ordered on Dexa report.

## 2015-07-13 ENCOUNTER — Encounter: Payer: Self-pay | Admitting: Family Medicine

## 2015-07-25 DIAGNOSIS — Z803 Family history of malignant neoplasm of breast: Secondary | ICD-10-CM | POA: Diagnosis not present

## 2015-07-25 DIAGNOSIS — Z1231 Encounter for screening mammogram for malignant neoplasm of breast: Secondary | ICD-10-CM | POA: Diagnosis not present

## 2015-09-05 ENCOUNTER — Other Ambulatory Visit: Payer: Self-pay | Admitting: *Deleted

## 2015-09-05 MED ORDER — VERAPAMIL HCL ER 240 MG PO TBCR: EXTENDED_RELEASE_TABLET | ORAL | Status: DC

## 2015-09-05 MED ORDER — MONTELUKAST SODIUM 10 MG PO TABS: ORAL_TABLET | ORAL | Status: DC

## 2015-09-05 NOTE — Telephone Encounter (Signed)
Received fax requesting refill on Verapamil and Singulair.   Refill appropriate and filled per protocol.

## 2015-09-10 ENCOUNTER — Telehealth: Payer: Self-pay | Admitting: Family Medicine

## 2015-09-10 MED ORDER — MONTELUKAST SODIUM 10 MG PO TABS: ORAL_TABLET | ORAL | Status: DC

## 2015-09-10 MED ORDER — VERAPAMIL HCL ER 240 MG PO TBCR: EXTENDED_RELEASE_TABLET | ORAL | Status: DC

## 2015-09-10 NOTE — Telephone Encounter (Signed)
Pt's Verapamil 240 mg and Montelukast 10 mg was sent in to the incorrect mail order pharmacy. She needs it sent to [facility] mail order.

## 2015-09-10 NOTE — Telephone Encounter (Signed)
Medication called/sent to requested pharmacy  

## 2015-10-19 DIAGNOSIS — K754 Autoimmune hepatitis: Secondary | ICD-10-CM | POA: Diagnosis not present

## 2015-10-26 ENCOUNTER — Ambulatory Visit: Payer: Medicare Other | Admitting: Family Medicine

## 2015-10-29 ENCOUNTER — Encounter: Payer: Self-pay | Admitting: Family Medicine

## 2015-10-29 ENCOUNTER — Ambulatory Visit (INDEPENDENT_AMBULATORY_CARE_PROVIDER_SITE_OTHER): Payer: Medicare Other | Admitting: Family Medicine

## 2015-10-29 VITALS — BP 134/80 | HR 60 | Temp 97.8°F | Resp 14 | Ht 67.5 in | Wt 176.0 lb

## 2015-10-29 DIAGNOSIS — L282 Other prurigo: Secondary | ICD-10-CM | POA: Diagnosis not present

## 2015-10-29 MED ORDER — MOMETASONE FUROATE 0.1 % EX CREA: 1.0000 "application " | TOPICAL_CREAM | Freq: Every day | CUTANEOUS | Status: DC

## 2015-10-29 NOTE — Progress Notes (Signed)
Subjective:    Patient ID: Veronica Flores, female    DOB: 01-20-1946, 70 y.o.   MRN: [id number]  HPI   The patient reports a lesion on the dorsal aspect of her left first MCP joint that has been there for approximately one month. It is 6 mm in size. It is an erythematous papule with white scale. It is not verruca form. It is extremely well-circumscribed. It also itches at times. I do not believe that it is neoplastic in nature. Past Medical History  Diagnosis Date  . Hypertension   . Allergy   . Asthma   . Fatigue   . Hypertrophic cardiomyopathy ([place])   . Autoimmune hepatitis ([place])   . Migraine, ophthalmoplegic   . Osteopenia    Past Surgical History  Procedure Laterality Date  . Cholecystectomy    . Ankle surgery    . Wrist surgery    . Brain surgery     Current Outpatient Prescriptions on File Prior to Visit  Medication Sig Dispense Refill  . ADVAIR DISKUS 100-50 MCG/DOSE AEPB USE 1 INHALATION BY MOUTH INTO THE LUNGS EVERY 12 HOURS 180 each 3  . ALPRAZolam (XANAX) 0.25 MG tablet Take 1 tablet (0.25 mg total) by mouth 2 (two) times daily as needed for anxiety. 20 tablet 0  . aspirin 325 MG tablet Take 325 mg by mouth daily.    Marland Kitchen azaTHIOprine (IMURAN) 50 MG tablet Take 50 mg by mouth daily. 2 pills once a day    . Calcium Carbonate-Vitamin D (CALTRATE 600+D) 600-400 MG-UNIT per chew tablet Chew 1 tablet by mouth 2 (two) times daily.      . cholecalciferol (VITAMIN D) 1000 UNITS tablet Take 1,000 Units by mouth daily.    Marland Kitchen ipratropium (ATROVENT) 0.06 % nasal spray USE 1 SPRAY INTO THE NOSE 3 TIMES DAILY 45 mL 5  . montelukast (SINGULAIR) 10 MG tablet TAKE 1 BY MOUTH AT BEDTIME 90 tablet 3  . pravastatin (PRAVACHOL) 40 MG tablet TAKE 1 BY MOUTH DAILY 90 tablet 3  . verapamil (CALAN-SR) 240 MG CR tablet TAKE 1 BY MOUTH DAILY 90 tablet 3   No current facility-administered medications on file prior to visit.   Allergies  Allergen Reactions  . Penicillins Anaphylaxis  .  Allegra [Fexofenadine Hydrochloride]     palpitations  . Desloratadine     Palpitations   . Cephalexin Hives and Rash   Social History   Social History  . Marital Status: Married    Spouse Name: N/A  . Number of Children: N/A  . Years of Education: N/A   Occupational History  . Not on file.   Social History Main Topics  . Smoking status: Never Smoker   . Smokeless tobacco: Never Used  . Alcohol Use: No  . Drug Use: No  . Sexual Activity: Not on file   Other Topics Concern  . Not on file   Social History Narrative     Review of Systems  All other systems reviewed and are negative.      Objective:   Physical Exam  HENT:  Nose: Nose normal.  Mouth/Throat: Oropharynx is clear and moist. No oropharyngeal exudate.  Neck: Neck supple.  Cardiovascular: Normal rate, regular rhythm and normal heart sounds.   No murmur heard. Pulmonary/Chest: Effort normal and breath sounds normal. No respiratory distress. She has no wheezes. She has no rales.  Abdominal: Soft. Bowel sounds are normal. She exhibits no distension. There is no tenderness. There is no  rebound and no guarding.  Musculoskeletal: She exhibits no edema.  Lymphadenopathy:    She has no cervical adenopathy.  Vitals reviewed.         Assessment & Plan:  Papular urticaria - Plan: mometasone (ELOCON) 0.1 % cream I believe this is localized allergic reaction possibly to an insect bite such as papular urticaria versus spongiform dermatitis. Begin Elocon cream applied twice a day to the area for 10 days. If lesion persist, consider a biopsy versus cryotherapy

## 2015-11-02 DIAGNOSIS — K754 Autoimmune hepatitis: Secondary | ICD-10-CM | POA: Diagnosis not present

## 2015-11-13 DIAGNOSIS — H4720 Unspecified optic atrophy: Secondary | ICD-10-CM | POA: Diagnosis not present

## 2015-11-13 DIAGNOSIS — H47091 Other disorders of optic nerve, not elsewhere classified, right eye: Secondary | ICD-10-CM | POA: Diagnosis not present

## 2015-11-13 DIAGNOSIS — H52203 Unspecified astigmatism, bilateral: Secondary | ICD-10-CM | POA: Diagnosis not present

## 2015-11-13 DIAGNOSIS — H2513 Age-related nuclear cataract, bilateral: Secondary | ICD-10-CM | POA: Diagnosis not present

## 2015-11-19 ENCOUNTER — Other Ambulatory Visit: Payer: Self-pay | Admitting: *Deleted

## 2015-11-19 MED ORDER — FLUTICASONE-SALMETEROL 100-50 MCG/DOSE IN AEPB: INHALATION_SPRAY | RESPIRATORY_TRACT | 3 refills | Status: DC

## 2015-11-19 NOTE — Telephone Encounter (Signed)
Patient in office with spouse.   Requested refill on Advair.   Prescription sent to pharmacy.

## 2015-11-22 ENCOUNTER — Encounter: Payer: Self-pay | Admitting: Family Medicine

## 2015-11-30 ENCOUNTER — Ambulatory Visit (INDEPENDENT_AMBULATORY_CARE_PROVIDER_SITE_OTHER): Payer: Medicare Other | Admitting: Family Medicine

## 2015-11-30 ENCOUNTER — Encounter: Payer: Self-pay | Admitting: Family Medicine

## 2015-11-30 VITALS — BP 126/80 | HR 68 | Temp 98.0°F | Resp 16 | Ht 67.5 in | Wt 174.0 lb

## 2015-11-30 DIAGNOSIS — E785 Hyperlipidemia, unspecified: Secondary | ICD-10-CM | POA: Diagnosis not present

## 2015-11-30 LAB — COMPLETE METABOLIC PANEL WITH GFR
ALT: 14 U/L (ref 6–29)
AST: 23 U/L (ref 10–35)
Albumin: 3.9 g/dL (ref 3.6–5.1)
Alkaline Phosphatase: 49 U/L (ref 33–130)
BILIRUBIN TOTAL: 0.7 mg/dL (ref 0.2–1.2)
BUN: 13 mg/dL (ref 7–25)
CALCIUM: 9.2 mg/dL (ref 8.6–10.4)
CHLORIDE: 107 mmol/L (ref 98–110)
CO2: 22 mmol/L (ref 20–31)
Creat: 0.81 mg/dL (ref 0.50–0.99)
GFR, EST AFRICAN AMERICAN: 86 mL/min (ref >=60)
GFR, EST NON AFRICAN AMERICAN: 74 mL/min (ref >=60)
Glucose, Bld: 76 mg/dL (ref 70–99)
Potassium: 4.8 mmol/L (ref 3.5–5.3)
Sodium: 140 mmol/L (ref 135–146)
Total Protein: 6.4 g/dL (ref 6.1–8.1)

## 2015-11-30 LAB — CBC WITH DIFFERENTIAL/PLATELET
BASOS ABS: 0 {cells}/uL (ref 0–200)
Basophils Relative: 0 %
EOS ABS: 0 {cells}/uL — AB (ref 15–500)
Eosinophils Relative: 0 %
HCT: 44.9 % (ref 35.0–45.0)
Hemoglobin: 15.1 g/dL — ABNORMAL HIGH (ref 12.0–15.0)
LYMPHS PCT: 19 %
Lymphs Abs: 1140 cells/uL (ref 850–3900)
MCH: 29.5 pg (ref 27.0–33.0)
MCHC: 33.6 g/dL (ref 32.0–36.0)
MCV: 87.9 fL (ref 80.0–100.0)
MONOS PCT: 7 %
MPV: 9.3 fL (ref 7.5–12.5)
Monocytes Absolute: 420 cells/uL (ref 200–950)
NEUTROS PCT: 74 %
Neutro Abs: 4440 cells/uL (ref 1500–7800)
PLATELETS: 331 10*3/uL (ref 140–400)
RBC: 5.11 MIL/uL — ABNORMAL HIGH (ref 3.80–5.10)
RDW: 15.9 % — AB (ref 11.0–15.0)
WBC: 6 10*3/uL (ref 3.8–10.8)

## 2015-11-30 LAB — LIPID PANEL
CHOLESTEROL: 104 mg/dL — AB (ref 125–200)
HDL: 29 mg/dL — AB (ref >=46)
LDL Cholesterol: 57 mg/dL (ref <130)
TRIGLYCERIDES: 90 mg/dL (ref <150)
Total CHOL/HDL Ratio: 3.6 Ratio (ref <=5.0)
VLDL: 18 mg/dL (ref <30)

## 2015-11-30 MED ORDER — AZITHROMYCIN 250 MG PO TABS: ORAL_TABLET | ORAL | 0 refills | Status: DC

## 2015-11-30 NOTE — Progress Notes (Signed)
Subjective:    Patient ID: Veronica Flores, female    DOB: 1945-08-15, 70 y.o.   MRN: [id number]  HPI 2/17 Patient is a very pleasant 70 year old white female who is here today for follow-up of her hyperlipidemia. She denies any myalgias or right upper quadrant pain. She is due to recheck her cholesterol. PMH is also significant for HOCM.  She denies any palpitations or near syncope.  She denies chest pain, sob, or dyspnea on exertion.  Since I last saw patient, her mother passed away.  She complains of fatigue, which she associates to not sleeping very well. Her husband has Parkinson's disease and frequently talks in his sleep or moves and wakes her up. She was also under a lot of stress caring for her mother which may have contributed to the fatigue. She denies any weight loss. She denies any fevers or bruising or rashes.  DEXA was last in 2015 and was excellent.  At that time, my plan was: Her blood pressure today is excellent at 130/64. She is actually gained 2 pounds since I last saw her which in some ways is a reassuring sign. I doubt serious infection or cancer as a cause of her fatigue if she is gaining weight over the last 4 months. I will monitor her liver function tests given her history of autoimmune hepatitis. I will check a CBC to monitor for any anemias or bone marrow suppression on the Imuran. I will also check a TSH as well as a vitamin B12 given her fatigue level. However I believe her fatigue is likely multifactorial and stems from caring for her mother, caring for her husband, and her mother's recent passing. Hopefully this will improve over time. Patient does not appear depressed but that would also be on the differential. She also complains of muscle aches and joint pains in her shoulders and in her knees. However I do not believe that this is related to PMR.  11/30/15 Patient is here for her regular check up.  However she reports feeling sick. Symptoms began on Tuesday. Symptoms  consist of postnasal drip, chest congestion, mucus in her upper airways, sinus congestion. She denies any fevers. She is having some occasional wheezing but not bad. She denies any shortness of breath or chest pain. She denies any purulent sputum or hemoptysis. She denies any sinus pain..  Regarding her chronic medical conditions, she denies any chest pain or palpitations or presyncope. She denies any dyspnea on exertion or shortness of breath. She denies any myalgias or right upper quadrant pain   Past Medical History:  Diagnosis Date  . Allergy   . Asthma   . Autoimmune hepatitis ([place])   . Fatigue   . Hypertension   . Hypertrophic cardiomyopathy ([place])   . Migraine, ophthalmoplegic   . Osteopenia    Past Surgical History:  Procedure Laterality Date  . ANKLE SURGERY    . BRAIN SURGERY    . CHOLECYSTECTOMY    . WRIST SURGERY     Current Outpatient Prescriptions on File Prior to Visit  Medication Sig Dispense Refill  . ALPRAZolam (XANAX) 0.25 MG tablet Take 1 tablet (0.25 mg total) by mouth 2 (two) times daily as needed for anxiety. 20 tablet 0  . aspirin 325 MG tablet Take 325 mg by mouth daily.    Marland Kitchen azaTHIOprine (IMURAN) 50 MG tablet Take 50 mg by mouth daily. 2 pills once a day    . Calcium Carbonate-Vitamin D (CALTRATE 600+D) 600-400 MG-UNIT per  chew tablet Chew 1 tablet by mouth 2 (two) times daily.      . cholecalciferol (VITAMIN D) 1000 UNITS tablet Take 1,000 Units by mouth daily.    . Fluticasone-Salmeterol (ADVAIR DISKUS) 100-50 MCG/DOSE AEPB USE 1 INHALATION BY MOUTH INTO THE LUNGS EVERY 12 HOURS 180 each 3  . ipratropium (ATROVENT) 0.06 % nasal spray USE 1 SPRAY INTO THE NOSE 3 TIMES DAILY 45 mL 5  . mometasone (ELOCON) 0.1 % cream Apply 1 application topically daily. 45 g 0  . montelukast (SINGULAIR) 10 MG tablet TAKE 1 BY MOUTH AT BEDTIME 90 tablet 3  . pravastatin (PRAVACHOL) 40 MG tablet TAKE 1 BY MOUTH DAILY 90 tablet 3  . verapamil (CALAN-SR) 240 MG CR tablet TAKE 1  BY MOUTH DAILY 90 tablet 3   No current facility-administered medications on file prior to visit.    Allergies  Allergen Reactions  . Penicillins Anaphylaxis  . Allegra [Fexofenadine Hydrochloride]     palpitations  . Desloratadine     Palpitations   . Cephalexin Hives and Rash   Social History   Social History  . Marital status: Married    Spouse name: N/A  . Number of children: N/A  . Years of education: N/A   Occupational History  . Not on file.   Social History Main Topics  . Smoking status: Never Smoker  . Smokeless tobacco: Never Used  . Alcohol use No  . Drug use: No  . Sexual activity: Not on file   Other Topics Concern  . Not on file   Social History Narrative  . No narrative on file     Review of Systems  All other systems reviewed and are negative.      Objective:   Physical Exam  HENT:  Nose: Nose normal.  Mouth/Throat: Oropharynx is clear and moist. No oropharyngeal exudate.  Neck: Neck supple.  Cardiovascular: Normal rate, regular rhythm and normal heart sounds.   No murmur heard. Pulmonary/Chest: Effort normal and breath sounds normal. No respiratory distress. She has no wheezes. She has no rales.  Abdominal: Soft. Bowel sounds are normal. She exhibits no distension. There is no tenderness. There is no rebound and no guarding.  Musculoskeletal: She exhibits no edema.  Lymphadenopathy:    She has no cervical adenopathy.  Vitals reviewed.         Assessment & Plan:  HLD (hyperlipidemia) - Plan: CBC with Differential/Platelet, COMPLETE METABOLIC PANEL WITH GFR, Lipid panel  I will check a CMP and a fasting lipid panel. Goal LDL cholesterol is less than 130. Blood pressure today is excellent at 126/80. She has no symptoms of concern regarding her hypertrophic cardiomyopathy. She denies any chest pain or syncope or presyncope or palpitations. I believe that the symptoms she's having is consistent with an upper respiratory infection. I  recommended tincture of time. She can use Mucinex or Delsym for chest congestion. I anticipate symptoms last for approximately one week. Because of her compromised immune system, I did give the patient prescription for a Z-Pak but she will not get the prescription filled unless she develops purulent sputum shortness of breath or high fevers over the weekend

## 2015-12-04 ENCOUNTER — Other Ambulatory Visit: Payer: Self-pay | Admitting: Family Medicine

## 2015-12-04 MED ORDER — PRAVASTATIN SODIUM 40 MG PO TABS: ORAL_TABLET | ORAL | 3 refills | Status: DC

## 2015-12-05 ENCOUNTER — Encounter: Payer: Self-pay | Admitting: Family Medicine

## 2016-02-07 ENCOUNTER — Ambulatory Visit (INDEPENDENT_AMBULATORY_CARE_PROVIDER_SITE_OTHER): Payer: Medicare Other | Admitting: *Deleted

## 2016-02-07 DIAGNOSIS — Z23 Encounter for immunization: Secondary | ICD-10-CM | POA: Diagnosis not present

## 2016-02-07 NOTE — Progress Notes (Signed)
Patient ID: Veronica Flores, female   DOB: 1945/11/18, 70 y.o.   MRN: [id number]  Patient seen in office for Influenza Vaccination.   Tolerated IM administration well.   Immunization history updated.

## 2016-03-03 ENCOUNTER — Other Ambulatory Visit: Payer: Self-pay | Admitting: Family Medicine

## 2016-03-19 ENCOUNTER — Other Ambulatory Visit: Payer: Self-pay | Admitting: Family Medicine

## 2016-03-19 ENCOUNTER — Encounter: Payer: Self-pay | Admitting: Family Medicine

## 2016-03-19 MED ORDER — IPRATROPIUM BROMIDE 0.06 % NA SOLN: NASAL | 3 refills | Status: DC

## 2016-03-27 ENCOUNTER — Other Ambulatory Visit: Payer: Self-pay

## 2016-04-10 DIAGNOSIS — Z86011 Personal history of benign neoplasm of the brain: Secondary | ICD-10-CM | POA: Diagnosis not present

## 2016-04-10 DIAGNOSIS — Z08 Encounter for follow-up examination after completed treatment for malignant neoplasm: Secondary | ICD-10-CM | POA: Diagnosis not present

## 2016-04-10 DIAGNOSIS — J328 Other chronic sinusitis: Secondary | ICD-10-CM | POA: Diagnosis not present

## 2016-04-10 DIAGNOSIS — Z9889 Other specified postprocedural states: Secondary | ICD-10-CM | POA: Diagnosis not present

## 2016-04-10 DIAGNOSIS — D329 Benign neoplasm of meninges, unspecified: Secondary | ICD-10-CM | POA: Diagnosis not present

## 2016-04-17 ENCOUNTER — Ambulatory Visit (INDEPENDENT_AMBULATORY_CARE_PROVIDER_SITE_OTHER): Payer: Medicare Other | Admitting: Family Medicine

## 2016-04-17 ENCOUNTER — Encounter: Payer: Self-pay | Admitting: Family Medicine

## 2016-04-17 VITALS — BP 136/74 | HR 68 | Temp 97.8°F | Resp 16 | Ht 67.5 in | Wt 172.0 lb

## 2016-04-17 DIAGNOSIS — J309 Allergic rhinitis, unspecified: Secondary | ICD-10-CM | POA: Diagnosis not present

## 2016-04-17 MED ORDER — PREDNISONE 20 MG PO TABS: ORAL_TABLET | ORAL | 0 refills | Status: DC

## 2016-04-17 MED ORDER — PREDNISONE 20 MG PO TABS
ORAL_TABLET | ORAL | 0 refills | Status: DC
Start: 2016-04-17 — End: 2016-04-17

## 2016-04-17 MED ORDER — LEVOCETIRIZINE DIHYDROCHLORIDE 5 MG PO TABS: 5.0000 mg | ORAL_TABLET | Freq: Every evening | ORAL | 5 refills | Status: DC

## 2016-04-17 NOTE — Progress Notes (Signed)
Subjective:    Patient ID: Veronica Flores, female    DOB: 11/26/1945, 70 y.o.   MRN: [id number]  HPI  Patient has a history of asthma and also allergies currently managed with Singulair as well as Advair. She reports that over the last 2 months particularly over the last 4 weeks, she has had daily rhinorrhea. She denies any sinus pain. She denies any sinus headaches. She denies any jaw pain or dental pain. She denies any pain in her maxillary sinuses. She does report postnasal drip, congestion, scratchy itchy throat secondary to postnasal drip, and a cough due to postnasal drip. She has not tried any nasal steroid sprays. She has not tried any antihistamines. Patient states she just wants this to be over. Past Medical History:  Diagnosis Date  . Allergy   . Asthma   . Autoimmune hepatitis ([place])   . Fatigue   . Hypertension   . Hypertrophic cardiomyopathy ([place])   . Migraine, ophthalmoplegic   . Osteopenia    Past Surgical History:  Procedure Laterality Date  . ANKLE SURGERY    . BRAIN SURGERY    . CHOLECYSTECTOMY    . WRIST SURGERY     Current Outpatient Prescriptions on File Prior to Visit  Medication Sig Dispense Refill  . ALPRAZolam (XANAX) 0.25 MG tablet Take 1 tablet (0.25 mg total) by mouth 2 (two) times daily as needed for anxiety. 20 tablet 0  . aspirin 325 MG tablet Take 325 mg by mouth daily.    Marland Kitchen azaTHIOprine (IMURAN) 50 MG tablet Take 50 mg by mouth daily. 2 pills once a day    . Calcium Carbonate-Vitamin D (CALTRATE 600+D) 600-400 MG-UNIT per chew tablet Chew 1 tablet by mouth 2 (two) times daily.      . cholecalciferol (VITAMIN D) 1000 UNITS tablet Take 1,000 Units by mouth daily.    . Fluticasone-Salmeterol (ADVAIR DISKUS) 100-50 MCG/DOSE AEPB USE 1 INHALATION BY MOUTH INTO THE LUNGS EVERY 12 HOURS 180 each 3  . ipratropium (ATROVENT) 0.06 % nasal spray USE ONE SPRAY NASALLY THREE TIMES DAILY 45 mL 3  . mometasone (ELOCON) 0.1 % cream Apply 1 application topically  daily. 45 g 0  . montelukast (SINGULAIR) 10 MG tablet TAKE 1 BY MOUTH AT BEDTIME 90 tablet 3  . pravastatin (PRAVACHOL) 40 MG tablet TAKE 1 BY MOUTH DAILY 90 tablet 3  . verapamil (CALAN-SR) 240 MG CR tablet TAKE 1 BY MOUTH DAILY 90 tablet 3   No current facility-administered medications on file prior to visit.    Allergies  Allergen Reactions  . Penicillins Anaphylaxis  . Allegra [Fexofenadine Hydrochloride]     palpitations  . Desloratadine     Palpitations   . Cephalexin Hives and Rash   Social History   Social History  . Marital status: Married    Spouse name: N/A  . Number of children: N/A  . Years of education: N/A   Occupational History  . Not on file.   Social History Main Topics  . Smoking status: Never Smoker  . Smokeless tobacco: Never Used  . Alcohol use No  . Drug use: No  . Sexual activity: Not on file   Other Topics Concern  . Not on file   Social History Narrative  . No narrative on file     Review of Systems  All other systems reviewed and are negative.      Objective:   Physical Exam  Constitutional: She appears well-developed and well-nourished. No  distress.  HENT:  Right Ear: Tympanic membrane and ear canal normal. Tympanic membrane is not injected, not erythematous, not retracted and not bulging.  Left Ear: Tympanic membrane and ear canal normal. Tympanic membrane is not injected, not erythematous, not retracted and not bulging.  Nose: Mucosal edema and rhinorrhea present. Right sinus exhibits no maxillary sinus tenderness and no frontal sinus tenderness. Left sinus exhibits no maxillary sinus tenderness and no frontal sinus tenderness.  Mouth/Throat: Mucous membranes are normal. No oropharyngeal exudate, posterior oropharyngeal edema, posterior oropharyngeal erythema or tonsillar abscesses.  Skin: She is not diaphoretic.  Vitals reviewed.         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 I recommended an antihistamine and nasal steroid spray. Patient like to try a short course of corticosteroids. She wants more rapid relief. She also has a history of intolerance to antihistamines in the past. Therefore I'll start the patient on a prednisone taper pack at her request. We will try next generation antihistamine xyzal, 5 mg daily to see if this will help prevent this in the future

## 2016-04-17 NOTE — Addendum Note (Signed)
Addended by: Jenna Luo on: 04/17/2016 03:10 PM   Modules accepted: Orders

## 2016-04-23 ENCOUNTER — Encounter: Payer: Self-pay | Admitting: Family Medicine

## 2016-04-23 DIAGNOSIS — J309 Allergic rhinitis, unspecified: Secondary | ICD-10-CM

## 2016-04-25 MED ORDER — PREDNISONE 20 MG PO TABS: ORAL_TABLET | ORAL | 0 refills | Status: DC

## 2016-04-25 MED ORDER — AZITHROMYCIN 250 MG PO TABS: ORAL_TABLET | ORAL | 0 refills | Status: DC

## 2016-06-02 ENCOUNTER — Ambulatory Visit (INDEPENDENT_AMBULATORY_CARE_PROVIDER_SITE_OTHER): Payer: Medicare Other | Admitting: Family Medicine

## 2016-06-02 VITALS — BP 112/66 | HR 64 | Temp 97.6°F | Resp 18 | Wt 174.0 lb

## 2016-06-02 DIAGNOSIS — I421 Obstructive hypertrophic cardiomyopathy: Secondary | ICD-10-CM

## 2016-06-02 DIAGNOSIS — F320 Major depressive disorder, single episode, mild: Secondary | ICD-10-CM

## 2016-06-02 DIAGNOSIS — E7800 Pure hypercholesterolemia, unspecified: Secondary | ICD-10-CM | POA: Diagnosis not present

## 2016-06-02 LAB — COMPLETE METABOLIC PANEL WITH GFR
ALT: 19 U/L (ref 6–29)
AST: 29 U/L (ref 10–35)
Albumin: 4.3 g/dL (ref 3.6–5.1)
Alkaline Phosphatase: 48 U/L (ref 33–130)
BUN: 19 mg/dL (ref 7–25)
CALCIUM: 9.6 mg/dL (ref 8.6–10.4)
CHLORIDE: 108 mmol/L (ref 98–110)
CO2: 23 mmol/L (ref 20–31)
Creat: 0.85 mg/dL (ref 0.60–0.93)
GFR, Est African American: 80 mL/min (ref >=60)
GFR, Est Non African American: 70 mL/min (ref >=60)
Glucose, Bld: 81 mg/dL (ref 70–99)
POTASSIUM: 4.4 mmol/L (ref 3.5–5.3)
SODIUM: 141 mmol/L (ref 135–146)
Total Bilirubin: 0.7 mg/dL (ref 0.2–1.2)
Total Protein: 6.9 g/dL (ref 6.1–8.1)

## 2016-06-02 LAB — LIPID PANEL
CHOL/HDL RATIO: 3.4 ratio (ref <5.0)
CHOLESTEROL: 111 mg/dL (ref <200)
HDL: 33 mg/dL — AB (ref >50)
LDL Cholesterol: 59 mg/dL (ref <100)
Triglycerides: 93 mg/dL (ref <150)
VLDL: 19 mg/dL (ref <30)

## 2016-06-02 LAB — CBC WITH DIFFERENTIAL/PLATELET
Basophils Absolute: 0 cells/uL (ref 0–200)
Basophils Relative: 0 %
EOS PCT: 0 %
Eosinophils Absolute: 0 cells/uL — ABNORMAL LOW (ref 15–500)
HEMATOCRIT: 46.4 % — AB (ref 35.0–45.0)
Hemoglobin: 15.1 g/dL — ABNORMAL HIGH (ref 12.0–15.0)
LYMPHS PCT: 36 %
Lymphs Abs: 1368 cells/uL (ref 850–3900)
MCH: 29.4 pg (ref 27.0–33.0)
MCHC: 32.5 g/dL (ref 32.0–36.0)
MCV: 90.4 fL (ref 80.0–100.0)
MPV: 9.1 fL (ref 7.5–12.5)
Monocytes Absolute: 228 cells/uL (ref 200–950)
Monocytes Relative: 6 %
NEUTROS PCT: 58 %
Neutro Abs: 2204 cells/uL (ref 1500–7800)
Platelets: 322 10*3/uL (ref 140–400)
RBC: 5.13 MIL/uL — AB (ref 3.80–5.10)
RDW: 15.3 % — AB (ref 11.0–15.0)
WBC: 3.8 10*3/uL (ref 3.8–10.8)

## 2016-06-02 MED ORDER — ESCITALOPRAM OXALATE 10 MG PO TABS: 10.0000 mg | ORAL_TABLET | Freq: Every day | ORAL | 5 refills | Status: DC

## 2016-06-02 NOTE — Progress Notes (Signed)
Subjective:    Patient ID: Veronica Flores, female    DOB: 04/05/1946, 71 y.o.   MRN: [id number]  Medication Refill   Sinus Problem    2/17 Patient is a very pleasant 71 year old white female who is here today for follow-up of her hyperlipidemia. She denies any myalgias or right upper quadrant pain. She is due to recheck her cholesterol. PMH is also significant for HOCM.  She denies any palpitations or near syncope.  She denies chest pain, sob, or dyspnea on exertion.  Since I last saw patient, her mother passed away.  She complains of fatigue, which she associates to not sleeping very well. Her husband has Parkinson's disease and frequently talks in his sleep or moves and wakes her up. She was also under a lot of stress caring for her mother which may have contributed to the fatigue. She denies any weight loss. She denies any fevers or bruising or rashes.  DEXA was last in 2015 and was excellent.  At that time, my plan was: Her blood pressure today is excellent at 130/64. She is actually gained 2 pounds since I last saw her which in some ways is a reassuring sign. I doubt serious infection or cancer as a cause of her fatigue if she is gaining weight over the last 4 months. I will monitor her liver function tests given her history of autoimmune hepatitis. I will check a CBC to monitor for any anemias or bone marrow suppression on the Imuran. I will also check a TSH as well as a vitamin B12 given her fatigue level. However I believe her fatigue is likely multifactorial and stems from caring for her mother, caring for her husband, and her mother's recent passing. Hopefully this will improve over time. Patient does not appear depressed but that would also be on the differential. She also complains of muscle aches and joint pains in her shoulders and in her knees. However I do not believe that this is related to PMR.  11/30/15 Patient is here for her regular check up.  However she reports feeling sick.  Symptoms began on Tuesday. Symptoms consist of postnasal drip, chest congestion, mucus in her upper airways, sinus congestion. She denies any fevers. She is having some occasional wheezing but not bad. She denies any shortness of breath or chest pain. She denies any purulent sputum or hemoptysis. She denies any sinus pain..  Regarding her chronic medical conditions, she denies any chest pain or palpitations or presyncope. She denies any dyspnea on exertion or shortness of breath. She denies any myalgias or right upper quadrant pain  06/02/16 Patient is here today for her regular follow-up. However she reports depression. She reports anhedonia. She reports difficulty sleeping and occasional anxiety. She reports panic attacks on occasion. The majority of the stems from dealing with her husband's Parkinson's disease which is steadily declining. She is also battling colon cancer in her daughter which has her worried because she could not go to [place] where her daughter lives in help her recover. Her mother also died last year after a prolonged battle with dementia. Otherwise she is doing relatively well. She denies any chest pain shortness of breath or dyspnea on exertion Past Medical History:  Diagnosis Date  . Allergy   . Asthma   . Autoimmune hepatitis ([place])   . Fatigue   . Hypertension   . Hypertrophic cardiomyopathy ([place])   . Migraine, ophthalmoplegic   . Osteopenia    Past Surgical History:  Procedure  Laterality Date  . ANKLE SURGERY    . BRAIN SURGERY    . CHOLECYSTECTOMY    . WRIST SURGERY     Current Outpatient Prescriptions on File Prior to Visit  Medication Sig Dispense Refill  . ALPRAZolam (XANAX) 0.25 MG tablet Take 1 tablet (0.25 mg total) by mouth 2 (two) times daily as needed for anxiety. 20 tablet 0  . aspirin 325 MG tablet Take 325 mg by mouth daily.    Marland Kitchen azaTHIOprine (IMURAN) 50 MG tablet Take 50 mg by mouth daily. 2 pills once a day    . azithromycin (ZITHROMAX) 250 MG  tablet 2 tabs po x 1 day; 1 tab po qd x days 2-5 6 tablet 0  . Calcium Carbonate-Vitamin D (CALTRATE 600+D) 600-400 MG-UNIT per chew tablet Chew 1 tablet by mouth 2 (two) times daily.      . cholecalciferol (VITAMIN D) 1000 UNITS tablet Take 1,000 Units by mouth daily.    . Fluticasone-Salmeterol (ADVAIR DISKUS) 100-50 MCG/DOSE AEPB USE 1 INHALATION BY MOUTH INTO THE LUNGS EVERY 12 HOURS 180 each 3  . ipratropium (ATROVENT) 0.06 % nasal spray USE ONE SPRAY NASALLY THREE TIMES DAILY 45 mL 3  . levocetirizine (XYZAL) 5 MG tablet Take 1 tablet (5 mg total) by mouth every evening. 30 tablet 5  . mometasone (ELOCON) 0.1 % cream Apply 1 application topically daily. 45 g 0  . montelukast (SINGULAIR) 10 MG tablet TAKE 1 BY MOUTH AT BEDTIME 90 tablet 3  . pravastatin (PRAVACHOL) 40 MG tablet TAKE 1 BY MOUTH DAILY 90 tablet 3  . predniSONE (DELTASONE) 20 MG tablet 3 tabs poqday 1-2, 2 tabs poqday 3-4, 1 tab poqday 5-6 12 tablet 0  . verapamil (CALAN-SR) 240 MG CR tablet TAKE 1 BY MOUTH DAILY 90 tablet 3   No current facility-administered medications on file prior to visit.    Allergies  Allergen Reactions  . Penicillins Anaphylaxis  . Allegra [Fexofenadine Hydrochloride]     palpitations  . Desloratadine     Palpitations   . Cephalexin Hives and Rash   Social History   Social History  . Marital status: Married    Spouse name: N/A  . Number of children: N/A  . Years of education: N/A   Occupational History  . Not on file.   Social History Main Topics  . Smoking status: Never Smoker  . Smokeless tobacco: Never Used  . Alcohol use No  . Drug use: No  . Sexual activity: Not on file   Other Topics Concern  . Not on file   Social History Narrative  . No narrative on file     Review of Systems  All other systems reviewed and are negative.      Objective:   Physical Exam  HENT:  Nose: Nose normal.  Mouth/Throat: Oropharynx is clear and moist. No oropharyngeal exudate.  Neck:  Neck supple.  Cardiovascular: Normal rate, regular rhythm and normal heart sounds.   No murmur heard. Pulmonary/Chest: Effort normal and breath sounds normal. No respiratory distress. She has no wheezes. She has no rales.  Abdominal: Soft. Bowel sounds are normal. She exhibits no distension. There is no tenderness. There is no rebound and no guarding.  Musculoskeletal: She exhibits no edema.  Lymphadenopathy:    She has no cervical adenopathy.  Vitals reviewed.         Assessment & Plan:  Pure hypercholesterolemia - Plan: CBC with Differential/Platelet, COMPLETE METABOLIC PANEL WITH GFR, Lipid panel  HOCM (  hypertrophic obstructive cardiomyopathy) ([facility])  Depression, major, single episode, mild ([place])   I will check a CMP and a fasting lipid panel. Goal LDL cholesterol is less than 130. Blood pressure today is excellent at 126/80. She has no symptoms of concern regarding her hypertrophic cardiomyopathy. She denies any chest pain or syncope or presyncope or palpitations.  Regarding her depression, I will treat the patient with Lexapro 10 mg a day and recheck in 4-6 weeks

## 2016-06-03 ENCOUNTER — Encounter: Payer: Self-pay | Admitting: Family Medicine

## 2016-07-21 ENCOUNTER — Ambulatory Visit (INDEPENDENT_AMBULATORY_CARE_PROVIDER_SITE_OTHER): Payer: Medicare Other

## 2016-07-21 ENCOUNTER — Encounter (HOSPITAL_COMMUNITY): Payer: Self-pay | Admitting: Family Medicine

## 2016-07-21 ENCOUNTER — Ambulatory Visit (HOSPITAL_COMMUNITY)
Admission: EM | Admit: 2016-07-21 | Discharge: 2016-07-21 | Disposition: A | Discharge status: Home / Self Care | Payer: Medicare Other | Attending: Family Medicine | Admitting: Family Medicine

## 2016-07-21 ENCOUNTER — Ambulatory Visit: Payer: Medicare Other

## 2016-07-21 DIAGNOSIS — M545 Low back pain: Secondary | ICD-10-CM | POA: Diagnosis not present

## 2016-07-21 DIAGNOSIS — S32010A Wedge compression fracture of first lumbar vertebra, initial encounter for closed fracture: Secondary | ICD-10-CM

## 2016-07-21 DIAGNOSIS — S39012A Strain of muscle, fascia and tendon of lower back, initial encounter: Secondary | ICD-10-CM | POA: Diagnosis not present

## 2016-07-21 DIAGNOSIS — Y92009 Unspecified place in unspecified non-institutional (private) residence as the place of occurrence of the external cause: Secondary | ICD-10-CM

## 2016-07-21 DIAGNOSIS — M4856XA Collapsed vertebra, not elsewhere classified, lumbar region, initial encounter for fracture: Secondary | ICD-10-CM

## 2016-07-21 DIAGNOSIS — W19XXXA Unspecified fall, initial encounter: Secondary | ICD-10-CM

## 2016-07-21 DIAGNOSIS — W010XXA Fall on same level from slipping, tripping and stumbling without subsequent striking against object, initial encounter: Secondary | ICD-10-CM

## 2016-07-21 DIAGNOSIS — Y92017 Garden or yard in single-family (private) house as the place of occurrence of the external cause: Secondary | ICD-10-CM | POA: Diagnosis not present

## 2016-07-21 MED ORDER — HYDROCODONE-ACETAMINOPHEN 5-325 MG PO TABS: 1.0000 | ORAL_TABLET | Freq: Four times a day (QID) | ORAL | 0 refills | Status: DC | PRN

## 2016-07-21 NOTE — ED Triage Notes (Signed)
Patient states she was outside working in the yard when she slipped and landed on her sacrum, patient reports severe lower back pain after the fall. No radiation.

## 2016-07-21 NOTE — Discharge Instructions (Signed)
You have a new compression fracture in the first lumbar vertebra.  I am referring you to a physician that can actually repair this injury.

## 2016-07-21 NOTE — ED Provider Notes (Signed)
[place]    CSN: [id number] Arrival date & time: 07/21/16  1309     History   Chief Complaint Chief Complaint  Patient presents with  . Fall  . Back Pain    HPI Veronica Flores is a 71 y.o. female.   Patient states she was outside working in the yard when she slipped and landed on her sacrum, patient reports severe lower back pain after the fall. No radiation.   Patient was grinding leaves to an embankment yesterday when she lost her balance and fell directly on her lower lumbar spine. She has some tingling for a little bit and then needed to crawl for a while before she could stand and get back to the house. She's had fairly significant pain ever since the fall. She's had no weakness in her legs or difficulty controlling her urination. She's had no loss of sensation in her legs.    patient has no pre-existing back problems. She has had an autoimmune hepatitis which precludes use of many medications. She has not taken anything for back pain so far.  Takes care of her husband who has Parkinson's and is bedridden.      Past Medical History:  Diagnosis Date  . Allergy   . Asthma   . Autoimmune hepatitis ([place])   . Fatigue   . Hypertension   . Hypertrophic cardiomyopathy ([place])   . Migraine, ophthalmoplegic   . Osteopenia     Patient Active Problem List   Diagnosis Date Noted  . Hypertension 07/06/2010  . Seasonal allergies 07/06/2010  . Fatigue 07/06/2010  . Asthma 07/06/2010  . Autoimmune hepatitis ([place]) 07/06/2010  . 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 Meningioma ([place]) 07/06/2010  . S/P cholecystectomy 07/06/2010  . Osteopenia 07/06/2010  . 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 ANKLE SURGERY    . BRAIN SURGERY    . CHOLECYSTECTOMY    . WRIST SURGERY      OB History    No data available       Home Medications    Prior to Admission medications   Medication Sig Start Date  End Date Taking? Authorizing Provider  ALPRAZolam (XANAX) 0.25 MG tablet Take 1 tablet (0.25 mg total) by mouth 2 (two) times daily as needed for anxiety. 06/13/14   Susy Frizzle, MD  aspirin 325 MG tablet Take 325 mg by mouth daily.    Historical Provider, MD  azaTHIOprine (IMURAN) 50 MG tablet Take 50 mg by mouth daily. 2 pills once a day    Historical Provider, MD  Calcium Carbonate-Vitamin D (CALTRATE 600+D) 600-400 MG-UNIT per chew tablet Chew 1 tablet by mouth 2 (two) times daily.      Historical Provider, MD  cholecalciferol (VITAMIN D) 1000 UNITS tablet Take 1,000 Units by mouth daily.    Historical Provider, MD  escitalopram (LEXAPRO) 10 MG tablet Take 1 tablet (10 mg total) by mouth daily. 06/02/16   Susy Frizzle, MD  Fluticasone-Salmeterol (ADVAIR DISKUS) 100-50 MCG/DOSE AEPB USE 1 INHALATION BY MOUTH INTO THE LUNGS EVERY 12 HOURS 11/19/15   Susy Frizzle, MD  HYDROcodone-acetaminophen (NORCO) 5-325 MG tablet Take 1 tablet by mouth every 6 (six) hours as needed for moderate pain. 07/21/16   Robyn Haber, MD  ipratropium (ATROVENT) 0.06 % nasal spray USE ONE SPRAY NASALLY THREE TIMES DAILY 03/19/16   Susy Frizzle, MD  levocetirizine (XYZAL) 5 MG tablet Take 1 tablet (5  mg total) by mouth every evening. 04/17/16   Susy Frizzle, MD  mometasone (ELOCON) 0.1 % cream Apply 1 application topically daily. 10/29/15   Susy Frizzle, MD  montelukast (SINGULAIR) 10 MG tablet TAKE 1 BY MOUTH AT BEDTIME 09/10/15   Susy Frizzle, MD  pravastatin (PRAVACHOL) 40 MG tablet TAKE 1 BY MOUTH DAILY 12/04/15   Susy Frizzle, MD  predniSONE (DELTASONE) 20 MG tablet 3 tabs poqday 1-2, 2 tabs poqday 3-4, 1 tab poqday 5-6 04/25/16   Susy Frizzle, MD  verapamil (CALAN-SR) 240 MG CR tablet TAKE 1 BY MOUTH DAILY 09/10/15   Susy Frizzle, MD    Family History History reviewed. No pertinent family history.  Social History Social History  Substance Use Topics  . Smoking status: Never  Smoker  . Smokeless tobacco: Never Used  . Alcohol use No     Allergies   Penicillins; Allegra [fexofenadine hydrochloride]; Desloratadine; and Cephalexin   Review of Systems Review of Systems  Constitutional: Negative.   HENT: Negative.   Respiratory: Negative.   Cardiovascular: Negative.   Gastrointestinal: Negative.   Genitourinary: Negative.   Musculoskeletal: Positive for back pain.  Neurological: Negative.   Psychiatric/Behavioral: Negative.      Physical Exam Triage Vital Signs ED Triage Vitals  Enc Vitals Group     BP 07/21/16 1348 (!) 161/80     Pulse Rate 07/21/16 1348 (!) 110     Resp 07/21/16 1348 (!) 22     Temp 07/21/16 1348 97.9 F (36.6 C)     Temp Source 07/21/16 1348 Oral     SpO2 07/21/16 1348 97 %     Weight --      Height --      Head Circumference --      Peak Flow --      Pain Score 07/21/16 1349 9     Pain Loc --      Pain Edu? --      Excl. in [place]? --    No data found.   Updated Vital Signs BP (!) 161/80 (BP Location: Right Arm)   Pulse (!) 110   Temp 97.9 F (36.6 C) (Oral)   Resp (!) 22   SpO2 97%   Physical Exam  Constitutional: She is oriented to person, place, and time. She appears well-developed and well-nourished.  HENT:  Right Ear: External ear normal.  Left Ear: External ear normal.  Mouth/Throat: Oropharynx is clear and moist.  Eyes: Conjunctivae and EOM are normal. Pupils are equal, round, and reactive to light.  Neck: Normal range of motion. Neck supple.  Cardiovascular: Normal rate.   Pulmonary/Chest: Effort normal.  Abdominal: Soft.  Musculoskeletal:  Patient is tender on palpation of her lower paraspinal regions bilaterally.  When seated, patient has some pain accentuated when lifting the left leg to 90 position. Straight leg raising on the right is not a problem.  Neurological: She is alert and oriented to person, place, and time. No sensory deficit. She exhibits normal muscle tone. Coordination normal.    Skin: Skin is warm and dry.  Nursing note and vitals reviewed.    UC Treatments / Results  Labs (all labs ordered are listed, but only abnormal results are displayed) Labs Reviewed - No data to display  EKG  EKG Interpretation None       Radiology Dg Lumbar Spine Complete  Result Date: 07/21/2016 CLINICAL DATA:  Patient fell today raking leaves. Low back pain more to the  LEFT. EXAM: LUMBAR SPINE - COMPLETE 4+ VIEW COMPARISON:  Chest radiograph 12/06/2012. FINDINGS: Skeletal osteopenia is noted. There is asymmetric loss of interspace height at L4-5 on the RIGHT which contributes to mild degenerative scoliosis convex LEFT. Osseous spurring is seen at multiple levels. There is an indeterminate age compression fracture of L1. Slight anterior wedging and superior endplate depression. Correlate clinically for tenderness at the thoracolumbar junction. Lower lumbar facet arthropathy. No pelvic abnormalities. Cholecystectomy. Moderate stool burden. Compared with the prior chest radiograph, L1 appeared normal in 2014. IMPRESSION: Query acute compression fracture of L1. Chronic lumbar spondylosis as described. Electronically Signed   By: Staci Righter M.D.   On: 07/21/2016 14:28    Procedures Procedures (including critical care time)  Medications Ordered in UC Medications - No data to display   Initial Impression / Assessment and Plan / UC Course  I have reviewed the triage vital signs and the nursing notes.  Pertinent labs & imaging results that were available during my care of the patient were reviewed by me and considered in my medical decision making (see chart for details).     Final Clinical Impressions(s) / UC Diagnoses   Final diagnoses:  Strain of lumbar region, initial encounter  Fall in home, initial encounter  Non-traumatic compression fracture of first lumbar vertebra, initial encounter ([place])    New Prescriptions New Prescriptions   HYDROCODONE-ACETAMINOPHEN (NORCO)  5-325 MG TABLET    Take 1 tablet by mouth every 6 (six) hours as needed for moderate pain.     Robyn Haber, MD 07/21/16 1436

## 2016-07-23 ENCOUNTER — Other Ambulatory Visit: Payer: Self-pay | Admitting: Family Medicine

## 2016-07-23 DIAGNOSIS — M4850XA Collapsed vertebra, not elsewhere classified, site unspecified, initial encounter for fracture: Principal | ICD-10-CM

## 2016-07-23 DIAGNOSIS — IMO0001 Reserved for inherently not codable concepts without codable children: Secondary | ICD-10-CM

## 2016-07-25 ENCOUNTER — Telehealth: Payer: Self-pay | Admitting: Family Medicine

## 2016-07-25 ENCOUNTER — Ambulatory Visit (HOSPITAL_COMMUNITY)
Admission: RE | Admit: 2016-07-25 | Discharge: 2016-07-25 | Disposition: A | Discharge status: Home / Self Care | Payer: Medicare Other | Source: Ambulatory Visit | Attending: Interventional Radiology | Admitting: Interventional Radiology

## 2016-07-25 ENCOUNTER — Ambulatory Visit (HOSPITAL_COMMUNITY): Admission: RE | Admit: 2016-07-25 | Payer: Medicare Other | Source: Ambulatory Visit

## 2016-07-25 ENCOUNTER — Other Ambulatory Visit (HOSPITAL_COMMUNITY): Payer: Self-pay | Admitting: Interventional Radiology

## 2016-07-25 DIAGNOSIS — M5127 Other intervertebral disc displacement, lumbosacral region: Secondary | ICD-10-CM | POA: Diagnosis not present

## 2016-07-25 DIAGNOSIS — M4856XA Collapsed vertebra, not elsewhere classified, lumbar region, initial encounter for fracture: Secondary | ICD-10-CM | POA: Insufficient documentation

## 2016-07-25 DIAGNOSIS — IMO0001 Reserved for inherently not codable concepts without codable children: Secondary | ICD-10-CM

## 2016-07-25 DIAGNOSIS — M5136 Other intervertebral disc degeneration, lumbar region: Secondary | ICD-10-CM | POA: Diagnosis not present

## 2016-07-25 DIAGNOSIS — M5126 Other intervertebral disc displacement, lumbar region: Secondary | ICD-10-CM | POA: Diagnosis not present

## 2016-07-25 DIAGNOSIS — M4850XA Collapsed vertebra, not elsewhere classified, site unspecified, initial encounter for fracture: Principal | ICD-10-CM

## 2016-07-25 DIAGNOSIS — S32010A Wedge compression fracture of first lumbar vertebra, initial encounter for closed fracture: Secondary | ICD-10-CM | POA: Diagnosis not present

## 2016-07-25 DIAGNOSIS — M5137 Other intervertebral disc degeneration, lumbosacral region: Secondary | ICD-10-CM | POA: Insufficient documentation

## 2016-07-25 MED ORDER — OXYCODONE-ACETAMINOPHEN 5-325 MG PO TABS
1.0000 | ORAL_TABLET | Freq: Four times a day (QID) | ORAL | 0 refills | Status: DC | PRN
Start: 2016-07-25 — End: 2017-07-13

## 2016-07-25 NOTE — Telephone Encounter (Signed)
Rx printed and pt aware it is ready.

## 2016-07-25 NOTE — Telephone Encounter (Signed)
Has compression fracture in spine.  Was suppose to see specialist today but it has been reschedule.  UC only gave her enough pain med to last to today.  It has not been working well.  Needs more pain medicine and some stronger please.

## 2016-07-25 NOTE — Telephone Encounter (Signed)
Percocet 5/325 1-2 poq6 hrs prn (30)

## 2016-07-29 ENCOUNTER — Other Ambulatory Visit (HOSPITAL_COMMUNITY): Payer: Self-pay | Admitting: Interventional Radiology

## 2016-07-29 DIAGNOSIS — M4850XA Collapsed vertebra, not elsewhere classified, site unspecified, initial encounter for fracture: Principal | ICD-10-CM

## 2016-07-29 DIAGNOSIS — IMO0001 Reserved for inherently not codable concepts without codable children: Secondary | ICD-10-CM

## 2016-07-30 ENCOUNTER — Ambulatory Visit (HOSPITAL_COMMUNITY)
Admission: RE | Admit: 2016-07-30 | Discharge: 2016-07-30 | Disposition: A | Discharge status: Home / Self Care | Payer: Medicare Other | Source: Ambulatory Visit | Attending: Interventional Radiology | Admitting: Interventional Radiology

## 2016-07-30 ENCOUNTER — Other Ambulatory Visit (HOSPITAL_COMMUNITY): Payer: Self-pay | Admitting: Interventional Radiology

## 2016-07-30 ENCOUNTER — Other Ambulatory Visit: Payer: Self-pay | Admitting: Radiology

## 2016-07-30 ENCOUNTER — Encounter (HOSPITAL_COMMUNITY): Payer: Self-pay

## 2016-07-30 DIAGNOSIS — Z880 Allergy status to penicillin: Secondary | ICD-10-CM | POA: Insufficient documentation

## 2016-07-30 DIAGNOSIS — M4850XA Collapsed vertebra, not elsewhere classified, site unspecified, initial encounter for fracture: Principal | ICD-10-CM

## 2016-07-30 DIAGNOSIS — W1830XA Fall on same level, unspecified, initial encounter: Secondary | ICD-10-CM | POA: Diagnosis not present

## 2016-07-30 DIAGNOSIS — G43B0 Ophthalmoplegic migraine, not intractable: Secondary | ICD-10-CM | POA: Diagnosis not present

## 2016-07-30 DIAGNOSIS — K754 Autoimmune hepatitis: Secondary | ICD-10-CM | POA: Diagnosis not present

## 2016-07-30 DIAGNOSIS — S32019A Unspecified fracture of first lumbar vertebra, initial encounter for closed fracture: Secondary | ICD-10-CM | POA: Diagnosis not present

## 2016-07-30 DIAGNOSIS — M8580 Other specified disorders of bone density and structure, unspecified site: Secondary | ICD-10-CM | POA: Insufficient documentation

## 2016-07-30 DIAGNOSIS — Z7982 Long term (current) use of aspirin: Secondary | ICD-10-CM | POA: Insufficient documentation

## 2016-07-30 DIAGNOSIS — IMO0001 Reserved for inherently not codable concepts without codable children: Secondary | ICD-10-CM

## 2016-07-30 DIAGNOSIS — I10 Essential (primary) hypertension: Secondary | ICD-10-CM | POA: Diagnosis not present

## 2016-07-30 DIAGNOSIS — J45909 Unspecified asthma, uncomplicated: Secondary | ICD-10-CM | POA: Diagnosis not present

## 2016-07-30 DIAGNOSIS — Y93H1 Activity, digging, shoveling and raking: Secondary | ICD-10-CM | POA: Diagnosis not present

## 2016-07-30 DIAGNOSIS — I422 Other hypertrophic cardiomyopathy: Secondary | ICD-10-CM | POA: Insufficient documentation

## 2016-07-30 DIAGNOSIS — M4856XA Collapsed vertebra, not elsewhere classified, lumbar region, initial encounter for fracture: Secondary | ICD-10-CM | POA: Diagnosis not present

## 2016-07-30 DIAGNOSIS — M47816 Spondylosis without myelopathy or radiculopathy, lumbar region: Secondary | ICD-10-CM | POA: Diagnosis not present

## 2016-07-30 DIAGNOSIS — M545 Low back pain: Secondary | ICD-10-CM | POA: Diagnosis not present

## 2016-07-30 HISTORY — PX: IR VERTEBROPLASTY LUMBAR BX INC UNI/BIL INC/INJECT/IMAGING: IMG5516

## 2016-07-30 LAB — BASIC METABOLIC PANEL
Anion gap: 9 (ref 5–15)
BUN: 17 mg/dL (ref 6–20)
CALCIUM: 9.3 mg/dL (ref 8.9–10.3)
CO2: 22 mmol/L (ref 22–32)
CREATININE: 0.86 mg/dL (ref 0.44–1.00)
Chloride: 108 mmol/L (ref 101–111)
GFR calc Af Amer: >60 mL/min (ref >60)
Glucose, Bld: 94 mg/dL (ref 65–99)
POTASSIUM: 3.8 mmol/L (ref 3.5–5.1)
SODIUM: 139 mmol/L (ref 135–145)

## 2016-07-30 LAB — PROTIME-INR
INR: 1.07
PROTHROMBIN TIME: 13.9 s (ref 11.4–15.2)

## 2016-07-30 LAB — CBC
HEMATOCRIT: 41.3 % (ref 36.0–46.0)
Hemoglobin: 13.5 g/dL (ref 12.0–15.0)
MCH: 29.2 pg (ref 26.0–34.0)
MCHC: 32.7 g/dL (ref 30.0–36.0)
MCV: 89.2 fL (ref 78.0–100.0)
PLATELETS: 318 10*3/uL (ref 150–400)
RBC: 4.63 MIL/uL (ref 3.87–5.11)
RDW: 14.6 % (ref 11.5–15.5)
WBC: 5.7 10*3/uL (ref 4.0–10.5)

## 2016-07-30 LAB — APTT: APTT: 31 s (ref 24–36)

## 2016-07-30 MED ORDER — MIDAZOLAM HCL 2 MG/2ML IJ SOLN
INTRAMUSCULAR | Status: AC | PRN
  Administered 2016-07-30 (×2): 1 mg via INTRAVENOUS

## 2016-07-30 MED ORDER — BUPIVACAINE HCL (PF) 0.25 % IJ SOLN
INTRAMUSCULAR | Status: AC
  Filled 2016-07-30: qty 30

## 2016-07-30 MED ORDER — MIDAZOLAM HCL 2 MG/2ML IJ SOLN
INTRAMUSCULAR | Status: AC
  Filled 2016-07-30: qty 2

## 2016-07-30 MED ORDER — GELATIN ABSORBABLE 12-7 MM EX MISC
CUTANEOUS | Status: AC
  Filled 2016-07-30: qty 1

## 2016-07-30 MED ORDER — TOBRAMYCIN SULFATE 1.2 G IJ SOLR
INTRAMUSCULAR | Status: AC
  Filled 2016-07-30: qty 1.2

## 2016-07-30 MED ORDER — VANCOMYCIN HCL IN DEXTROSE 1-5 GM/200ML-% IV SOLN
INTRAVENOUS | Status: AC
  Administered 2016-07-30: 1000 mg via INTRAVENOUS
  Filled 2016-07-30: qty 200

## 2016-07-30 MED ORDER — SODIUM CHLORIDE 0.9 % IV SOLN: INTRAVENOUS | Status: AC

## 2016-07-30 MED ORDER — FENTANYL CITRATE (PF) 100 MCG/2ML IJ SOLN
INTRAMUSCULAR | Status: AC | PRN
  Administered 2016-07-30 (×2): 25 ug via INTRAVENOUS

## 2016-07-30 MED ORDER — BUPIVACAINE HCL (PF) 0.5 % IJ SOLN
INTRAMUSCULAR | Status: AC | PRN
  Administered 2016-07-30: 20 mL

## 2016-07-30 MED ORDER — SODIUM CHLORIDE 0.9 % IV SOLN
INTRAVENOUS | Status: DC
  Administered 2016-07-30: 11:00:00 via INTRAVENOUS

## 2016-07-30 MED ORDER — TOBRAMYCIN SULFATE 1.2 G IJ SOLR
INTRAMUSCULAR | Status: AC | PRN
  Administered 2016-07-30: 1.2 g via TOPICAL

## 2016-07-30 MED ORDER — IOPAMIDOL (ISOVUE-300) INJECTION 61%
INTRAVENOUS | Status: AC
  Administered 2016-07-30: 3 mL
  Filled 2016-07-30: qty 50

## 2016-07-30 MED ORDER — FENTANYL CITRATE (PF) 100 MCG/2ML IJ SOLN
INTRAMUSCULAR | Status: AC
  Filled 2016-07-30: qty 2

## 2016-07-30 MED ORDER — VANCOMYCIN HCL IN DEXTROSE 1-5 GM/200ML-% IV SOLN
1000.0000 mg | INTRAVENOUS | Status: AC
  Administered 2016-07-30: 1000 mg via INTRAVENOUS

## 2016-07-30 NOTE — H&P (Signed)
Chief Complaint: Patient was seen in consultation today for Lumbar 1 vertebroplasty/kyphoplasty at the request of Dr Ruthe Mannan  Referring Physician(s): Dr Ruthe Mannan  Supervising Physician: Luanne Bras  Patient Status: [name]  History of Present Illness: Veronica Flores is a 71 y.o. female   [place] in yard 07/21/16 when she was raking leaves [place] onto back and has had worsening pain since Pain meds without relief She cannot continue with her daily activities secondary pain MRI 4/6: IMPRESSION: 1. Acute L1 compression fracture with ~25% height loss. Mild and noncompressive retropulsion. 2. Noncompressive degenerative changes that are described above  Referred to Dr Estanislado Pandy for evaluation and possible treatment   Past Medical History:  Diagnosis Date  . Allergy   . Asthma   . Autoimmune hepatitis ([place])   . Fatigue   . Hypertension   . Hypertrophic cardiomyopathy ([place])   . Migraine, ophthalmoplegic   . Osteopenia     Past Surgical History:  Procedure Laterality Date  . ANKLE SURGERY    . BRAIN SURGERY    . CHOLECYSTECTOMY    . WRIST SURGERY      Allergies: Penicillins; Allegra [fexofenadine hydrochloride]; Desloratadine; and Cephalexin  Medications: Prior to Admission medications   Medication Sig Start Date End Date Taking? Authorizing Provider  aspirin 325 MG tablet Take 325 mg by mouth daily.   Yes Historical Provider, MD  azaTHIOprine (IMURAN) 50 MG tablet Take 100 mg by mouth daily. 2 pills once a day   Yes Historical Provider, MD  Calcium Carbonate-Vitamin D (CALTRATE 600+D) 600-400 MG-UNIT per chew tablet Chew 1 tablet by mouth 2 (two) times daily.     Yes Historical Provider, MD  cholecalciferol (VITAMIN D) 1000 UNITS tablet Take 1,000 Units by mouth daily.   Yes Historical Provider, MD  docusate sodium (COLACE) 100 MG capsule Take 100 mg by mouth daily.   Yes Historical Provider, MD  escitalopram (LEXAPRO) 10 MG tablet Take 1 tablet  (10 mg total) by mouth daily. 06/02/16  Yes Susy Frizzle, MD  Fluticasone-Salmeterol (ADVAIR DISKUS) 100-50 MCG/DOSE AEPB USE 1 INHALATION BY MOUTH INTO THE LUNGS EVERY 12 HOURS 11/19/15  Yes Susy Frizzle, MD  HYDROcodone-acetaminophen (NORCO) 5-325 MG tablet Take 1 tablet by mouth every 6 (six) hours as needed for moderate pain. 07/21/16  Yes Robyn Haber, MD  ipratropium (ATROVENT) 0.06 % nasal spray USE ONE SPRAY NASALLY THREE TIMES DAILY 03/19/16  Yes Susy Frizzle, MD  levocetirizine (XYZAL) 5 MG tablet Take 1 tablet (5 mg total) by mouth every evening. Patient taking differently: Take 5 mg by mouth daily as needed for allergies.  04/17/16  Yes Susy Frizzle, MD  montelukast (SINGULAIR) 10 MG tablet TAKE 1 BY MOUTH AT BEDTIME 09/10/15  Yes Susy Frizzle, MD  oxyCODONE-acetaminophen (ROXICET) 5-325 MG tablet Take 1-2 tablets by mouth every 6 (six) hours as needed for severe pain. 07/25/16  Yes Susy Frizzle, MD  pravastatin (PRAVACHOL) 40 MG tablet TAKE 1 BY MOUTH DAILY 12/04/15  Yes Susy Frizzle, MD  verapamil (CALAN-SR) 240 MG CR tablet TAKE 1 BY MOUTH DAILY 09/10/15  Yes Susy Frizzle, MD  mometasone (ELOCON) 0.1 % cream Apply 1 application topically daily. Patient taking differently: Apply 1 application topically daily as needed (skin irritations).  10/29/15   Susy Frizzle, MD     History reviewed. No pertinent family history.  Social History   Social History  . Marital status: Married  Spouse name: N/A  . Number of children: N/A  . Years of education: N/A   Social History Main Topics  . Smoking status: Never Smoker  . Smokeless tobacco: Never Used  . Alcohol use No  . Drug use: No  . Sexual activity: Not Asked   Other Topics Concern  . None   Social History Narrative  . None    Review of Systems: A 12 point ROS discussed and pertinent positives are indicated in the HPI above.  All other systems are negative.  Review of Systems    Constitutional: Positive for activity change. Negative for appetite change, fatigue and fever.  Respiratory: Negative for cough and shortness of breath.   Cardiovascular: Negative for chest pain.  Gastrointestinal: Negative for abdominal pain.  Musculoskeletal: Positive for back pain.  Neurological: Negative for weakness.  Psychiatric/Behavioral: Negative for behavioral problems and confusion.    Vital Signs: BP 134/69   Pulse 66   Temp 98.1 F (36.7 C) (Oral)   Ht 5\' 7"  (1.702 m)   Wt 171 lb (77.6 kg)   SpO2 100%   BMI 26.78 kg/m   Physical Exam  Constitutional: She is oriented to person, place, and time. She appears well-nourished.  Cardiovascular: Normal rate, regular rhythm and normal heart sounds.   Pulmonary/Chest: Effort normal and breath sounds normal.  Abdominal: Soft. Bowel sounds are normal.  Musculoskeletal: Normal range of motion.  Mid back pain  Neurological: She is alert and oriented to person, place, and time.  Skin: Skin is warm and dry.  Psychiatric: She has a normal mood and affect. Her behavior is normal. Judgment and thought content normal.  Nursing note and vitals reviewed.   Mallampati Score:  MD Evaluation Airway: WNL Heart: WNL Abdomen: WNL Chest/ Lungs: WNL ASA  Classification: 2 Mallampati/Airway Score: One  Imaging: Dg Lumbar Spine Complete  Result Date: 07/21/2016 CLINICAL DATA:  Patient fell today raking leaves. Low back pain more to the LEFT. EXAM: LUMBAR SPINE - COMPLETE 4+ VIEW COMPARISON:  Chest radiograph 12/06/2012. FINDINGS: Skeletal osteopenia is noted. There is asymmetric loss of interspace height at L4-5 on the RIGHT which contributes to mild degenerative scoliosis convex LEFT. Osseous spurring is seen at multiple levels. There is an indeterminate age compression fracture of L1. Slight anterior wedging and superior endplate depression. Correlate clinically for tenderness at the thoracolumbar junction. Lower lumbar facet  arthropathy. No pelvic abnormalities. Cholecystectomy. Moderate stool burden. Compared with the prior chest radiograph, L1 appeared normal in 2014. IMPRESSION: Query acute compression fracture of L1. Chronic lumbar spondylosis as described. Electronically Signed   By: Staci Righter M.D.   On: 07/21/2016 14:28   Mr Lumbar Spine Wo Contrast  Result Date: 07/25/2016 CLINICAL DATA:  Fall 07/21/2016 with compression fracture. Subsequent encounter. EXAM: MRI LUMBAR SPINE WITHOUT CONTRAST TECHNIQUE: Multiplanar, multisequence MR imaging of the lumbar spine was performed. No intravenous contrast was administered. COMPARISON:  Radiography 4 days ago. FINDINGS: Segmentation:  Standard. Alignment:  Physiologic. Vertebrae: Unhealed compression fracture with band of marrow edema and fracture plane, acute based on the history. Height loss measures approximately 25%. Mild retropulsion without impingement. No evidence of ligamentous injury. No second fracture noted. Conus medullaris: Extends to the L2 level and appears normal. Paraspinal and other soft tissues: Renal sinus cysts incidentally noted. Disc levels: T12- L1: No degenerative changes L1-L2: No degenerative changes. L2-L3: Disc narrowing and bulging with posterior annular fissure. There is a superimposed right inferior foraminal protrusion that is noncompressive. No impingement L3-L4: Disc  narrowing and biforaminal protrusions, larger on the right, noncompressive L4-L5: Moderate disc narrowing with circumferential bulging. There is a discal cyst or T2 hyperintense herniation right paracentral that extends superiorly. No impingement L5-S1:Asymmetric left disc narrowing and bulging. Mild facet spurring. No impingement IMPRESSION: 1. Acute L1 compression fracture with ~25% height loss. Mild and noncompressive retropulsion. 2. Noncompressive degenerative changes that are described above. Electronically Signed   By: Monte Fantasia M.D.   On: 07/25/2016 18:39     Labs:  CBC:  Recent Labs  11/30/15 0816 06/02/16 0822  WBC 6.0 3.8  HGB 15.1* 15.1*  HCT 44.9 46.4*  PLT 331 322    COAGS: No results for input(s): INR, APTT in the last 8760 hours.  BMP:  Recent Labs  11/30/15 0816 06/02/16 0822  NA 140 141  K 4.8 4.4  CL 107 108  CO2 22 23  GLUCOSE 76 81  BUN 13 19  CALCIUM 9.2 9.6  CREATININE 0.81 0.85  GFRNONAA 74 70  GFRAA 86 80    LIVER FUNCTION TESTS:  Recent Labs  11/30/15 0816 06/02/16 0822  BILITOT 0.7 0.7  AST 23 29  ALT 14 19  ALKPHOS 49 48  PROT 6.4 6.9  ALBUMIN 3.9 4.3    TUMOR MARKERS: No results for input(s): AFPTM, CEA, CA199, CHROMGRNA in the last 8760 hours.  Assessment and Plan:  Painful lumbar 1 acute fracture Now scheduled for vertebroplasty/kyphoplasty Risks and Benefits discussed with the patient including, but not limited to education regarding the natural healing process of compression fractures without intervention, bleeding, infection, cement migration which may cause spinal cord damage, paralysis, pulmonary embolism or even death. All of the patient's questions were answered, patient is agreeable to proceed. Consent signed and in chart.  Thank you for this interesting consult.  I greatly enjoyed meeting KEONA BILYEU and look forward to participating in their care.  A copy of this report was sent to the requesting provider on this date.  Electronically Signed: Erendida Wrenn A 07/30/2016, 11:09 AM   I spent a total of  30 Minutes   in face to face in clinical consultation, greater than 50% of which was counseling/coordinating care for L1 VP/KP

## 2016-07-30 NOTE — Discharge Instructions (Signed)
KYPHOPLASTY/VERTEBROPLASTY DISCHARGE INSTRUCTIONS  Medications: (check all that apply)     Resume all home medications as before procedure.                  Continue your pain medications as prescribed as needed.  Over the next 3-5 days, decrease your pain medication as tolerated.  Over the counter medications (i.e. Tylenol, ibuprofen, and aleve) may be substituted once severe/moderate pain symptoms have subsided.   Wound Care: - Bandages may be removed the day following your procedure.  You may get your incision wet once bandages are removed.  Bandaids may be used to cover the incisions until scab formation.  Topical ointments are optional.  - If you develop a fever greater than 101 degrees, have increased skin redness at the incision sites or pus-like oozing from incisions occurring within 1 week of the procedure, contact radiology at [phone] or [phone].  - Ice pack to back for 15-20 minutes 2-3 time per day for first 2-3 days post procedure.  The ice will expedite muscle healing and help with the pain from the incisions.   Activity: - Bedrest today with limited activity for 24 hours post procedure.  - No driving for 48 hours.  - Increase your activity as tolerated after bedrest (with assistance if necessary).  - Refrain from any strenuous activity or heavy lifting (greater than 10 lbs.).   Follow up: - Contact radiology at [phone] or [phone] if any questions/concerns.  - A physician assistant from radiology will contact you in approximately 1 week.  - If a biopsy was performed at the time of your procedure, your referring physician should receive the results in usually 2-3 days.         1.No stooping,bending or lifting more than 10 lbs for 2 weeks. 2.Use walker to ambulate for 2 weeks. 3.RTC in  2weeks 4.No driving for 2 weeks

## 2016-07-30 NOTE — Procedures (Signed)
S/P L1 VP 

## 2016-08-04 ENCOUNTER — Encounter (HOSPITAL_COMMUNITY): Payer: Self-pay | Admitting: Interventional Radiology

## 2016-08-11 ENCOUNTER — Telehealth (HOSPITAL_COMMUNITY): Payer: Self-pay

## 2016-08-11 ENCOUNTER — Other Ambulatory Visit (HOSPITAL_COMMUNITY): Payer: Self-pay | Admitting: Interventional Radiology

## 2016-08-11 DIAGNOSIS — IMO0001 Reserved for inherently not codable concepts without codable children: Secondary | ICD-10-CM

## 2016-08-11 DIAGNOSIS — M4850XA Collapsed vertebra, not elsewhere classified, site unspecified, initial encounter for fracture: Principal | ICD-10-CM

## 2016-08-11 NOTE — Telephone Encounter (Signed)
Called daughter to schedule 2 wk f/u, left message to return call. AW

## 2016-08-26 ENCOUNTER — Ambulatory Visit (HOSPITAL_COMMUNITY)
Admission: RE | Admit: 2016-08-26 | Discharge: 2016-08-26 | Disposition: A | Discharge status: Home / Self Care | Payer: Medicare Other | Source: Ambulatory Visit | Attending: Interventional Radiology | Admitting: Interventional Radiology

## 2016-08-26 DIAGNOSIS — IMO0001 Reserved for inherently not codable concepts without codable children: Secondary | ICD-10-CM

## 2016-08-26 DIAGNOSIS — M4850XA Collapsed vertebra, not elsewhere classified, site unspecified, initial encounter for fracture: Secondary | ICD-10-CM | POA: Diagnosis not present

## 2016-08-26 HISTORY — PX: IR RADIOLOGIST EVAL & MGMT: IMG5224

## 2016-08-27 ENCOUNTER — Encounter (HOSPITAL_COMMUNITY): Payer: Self-pay | Admitting: Interventional Radiology

## 2016-08-27 ENCOUNTER — Other Ambulatory Visit: Payer: Self-pay | Admitting: Family Medicine

## 2016-08-27 MED ORDER — VERAPAMIL HCL ER 240 MG PO TBCR: EXTENDED_RELEASE_TABLET | ORAL | 3 refills | Status: DC

## 2016-09-13 ENCOUNTER — Encounter: Payer: Self-pay | Admitting: Family Medicine

## 2016-09-13 ENCOUNTER — Other Ambulatory Visit: Payer: Self-pay | Admitting: Family Medicine

## 2016-10-06 DIAGNOSIS — Z1231 Encounter for screening mammogram for malignant neoplasm of breast: Secondary | ICD-10-CM | POA: Diagnosis not present

## 2016-10-06 DIAGNOSIS — K754 Autoimmune hepatitis: Secondary | ICD-10-CM | POA: Diagnosis not present

## 2016-10-06 DIAGNOSIS — Z803 Family history of malignant neoplasm of breast: Secondary | ICD-10-CM | POA: Diagnosis not present

## 2016-10-06 LAB — HM MAMMOGRAPHY

## 2016-10-08 ENCOUNTER — Encounter: Payer: Self-pay | Admitting: Family Medicine

## 2016-11-05 DIAGNOSIS — Z800 Family history of malignant neoplasm of digestive organs: Secondary | ICD-10-CM | POA: Insufficient documentation

## 2016-11-05 DIAGNOSIS — K5900 Constipation, unspecified: Secondary | ICD-10-CM | POA: Diagnosis not present

## 2016-11-05 DIAGNOSIS — Z86010 Personal history of colonic polyps: Secondary | ICD-10-CM | POA: Insufficient documentation

## 2016-11-05 DIAGNOSIS — K754 Autoimmune hepatitis: Secondary | ICD-10-CM | POA: Diagnosis not present

## 2016-11-18 DIAGNOSIS — H2513 Age-related nuclear cataract, bilateral: Secondary | ICD-10-CM | POA: Diagnosis not present

## 2016-11-18 DIAGNOSIS — H5340 Unspecified visual field defects: Secondary | ICD-10-CM | POA: Diagnosis not present

## 2016-11-18 DIAGNOSIS — H52203 Unspecified astigmatism, bilateral: Secondary | ICD-10-CM | POA: Diagnosis not present

## 2016-11-18 DIAGNOSIS — H02401 Unspecified ptosis of right eyelid: Secondary | ICD-10-CM | POA: Diagnosis not present

## 2016-12-01 ENCOUNTER — Encounter: Payer: Self-pay | Admitting: Family Medicine

## 2016-12-01 ENCOUNTER — Ambulatory Visit (INDEPENDENT_AMBULATORY_CARE_PROVIDER_SITE_OTHER): Payer: Medicare Other | Admitting: Family Medicine

## 2016-12-01 ENCOUNTER — Other Ambulatory Visit: Payer: Self-pay | Admitting: Family Medicine

## 2016-12-01 VITALS — BP 126/74 | HR 64 | Temp 98.1°F | Resp 18 | Wt 174.0 lb

## 2016-12-01 DIAGNOSIS — E7800 Pure hypercholesterolemia, unspecified: Secondary | ICD-10-CM | POA: Diagnosis not present

## 2016-12-01 DIAGNOSIS — K754 Autoimmune hepatitis: Secondary | ICD-10-CM | POA: Diagnosis not present

## 2016-12-01 DIAGNOSIS — I421 Obstructive hypertrophic cardiomyopathy: Secondary | ICD-10-CM

## 2016-12-01 LAB — CBC WITH DIFFERENTIAL/PLATELET
BASOS ABS: 41 {cells}/uL (ref 0–200)
Basophils Relative: 1 %
EOS ABS: 0 {cells}/uL — AB (ref 15–500)
EOS PCT: 0 %
HCT: 45.5 % — ABNORMAL HIGH (ref 35.0–45.0)
Hemoglobin: 14.9 g/dL (ref 12.0–15.0)
LYMPHS PCT: 31 %
Lymphs Abs: 1271 cells/uL (ref 850–3900)
MCH: 30 pg (ref 27.0–33.0)
MCHC: 32.7 g/dL (ref 32.0–36.0)
MCV: 91.7 fL (ref 80.0–100.0)
MONOS PCT: 6 %
MPV: 9.2 fL (ref 7.5–12.5)
Monocytes Absolute: 246 cells/uL (ref 200–950)
NEUTROS PCT: 62 %
Neutro Abs: 2542 cells/uL (ref 1500–7800)
Platelets: 323 10*3/uL (ref 140–400)
RBC: 4.96 MIL/uL (ref 3.80–5.10)
RDW: 15.5 % — ABNORMAL HIGH (ref 11.0–15.0)
WBC: 4.1 10*3/uL (ref 3.8–10.8)

## 2016-12-01 LAB — COMPLETE METABOLIC PANEL WITH GFR
ALBUMIN: 4.1 g/dL (ref 3.6–5.1)
ALK PHOS: 53 U/L (ref 33–130)
ALT: 13 U/L (ref 6–29)
AST: 22 U/L (ref 10–35)
BILIRUBIN TOTAL: 0.6 mg/dL (ref 0.2–1.2)
BUN: 13 mg/dL (ref 7–25)
CALCIUM: 9.3 mg/dL (ref 8.6–10.4)
CO2: 21 mmol/L (ref 20–32)
Chloride: 110 mmol/L (ref 98–110)
Creat: 0.92 mg/dL (ref 0.60–0.93)
GFR, EST AFRICAN AMERICAN: 73 mL/min (ref >=60)
GFR, EST NON AFRICAN AMERICAN: 63 mL/min (ref >=60)
Glucose, Bld: 85 mg/dL (ref 70–99)
Potassium: 4.4 mmol/L (ref 3.5–5.3)
Sodium: 142 mmol/L (ref 135–146)
TOTAL PROTEIN: 6.5 g/dL (ref 6.1–8.1)

## 2016-12-01 LAB — LIPID PANEL
CHOLESTEROL: 104 mg/dL (ref <200)
HDL: 31 mg/dL — AB (ref >50)
LDL Cholesterol: 55 mg/dL (ref <100)
Total CHOL/HDL Ratio: 3.4 Ratio (ref <5.0)
Triglycerides: 92 mg/dL (ref <150)
VLDL: 18 mg/dL (ref <30)

## 2016-12-01 MED ORDER — PRAVASTATIN SODIUM 40 MG PO TABS: ORAL_TABLET | ORAL | 3 refills | Status: DC

## 2016-12-01 MED ORDER — FLUTICASONE-SALMETEROL 100-50 MCG/DOSE IN AEPB: INHALATION_SPRAY | RESPIRATORY_TRACT | 3 refills | Status: DC

## 2016-12-01 NOTE — Telephone Encounter (Signed)
Medication refilled per protocol. 

## 2016-12-01 NOTE — Progress Notes (Signed)
Subjective:    Patient ID: Veronica Flores, female    DOB: 1945/12/07, 71 y.o.   MRN: [id number]  Medication Refill   Sinus Problem    06/02/16 Patient is here today for her regular follow-up. However she reports depression. She reports anhedonia. She reports difficulty sleeping and occasional anxiety. She reports panic attacks on occasion. The majority of the stems from dealing with her husband's Veronica Flores which is steadily declining. She is also battling colon cancer in her daughter which has her worried because she could not go to [place] where her daughter lives in help her recover. Her mother also died last year after a prolonged battle with dementia. Otherwise she is doing relatively well. She denies any chest pain shortness of breath or dyspnea on exertion.  At that time, my plan was: I will check a CMP and a fasting lipid panel. Goal LDL cholesterol is less than 130. Blood pressure today is excellent at 126/80. She has no symptoms of concern regarding her hypertrophic cardiomyopathy. She denies any chest pain or syncope or presyncope or palpitations.  Regarding her depression, I will treat the patient with Veronica Flores 10 mg a day and recheck in 4-6 weeks  12/01/16 Patient states that her depression is doing better on Veronica Flores. However she is only able to tolerate 5 mg a day. She states that 10 mg a day made her feel like a "zombie". She denies any diarrhea or stomach irritation on the medication. She denies any chest pain shortness of breath or dyspnea on exertion. Her weight remained stable as it has been for more than a year. Her blood pressure today is excellent 126/74. She denies any palpitations or syncope or presyncope. She denies any jaundice or abdominal discomfort. She is due today for fasting lab work Past Medical History:  Diagnosis Date  . Allergy   . Asthma   . Autoimmune hepatitis ([place])   . Fatigue   . Hypertension   . Hypertrophic cardiomyopathy ([place])   . Migraine,  ophthalmoplegic   . Osteopenia    Past Surgical History:  Procedure Laterality Date  . ANKLE SURGERY    . BRAIN SURGERY    . CHOLECYSTECTOMY    . IR RADIOLOGIST EVAL & MGMT  08/26/2016  . IR VERTEBROPLASTY LUMBAR BX INC UNI/BIL INC/INJECT/IMAGING  07/30/2016  . WRIST SURGERY     Current Outpatient Prescriptions on File Prior to Visit  Medication Sig Dispense Refill  . aspirin 325 MG tablet Take 325 mg by mouth daily.    Marland Kitchen azaTHIOprine (IMURAN) 50 MG tablet Take 100 mg by mouth daily. 2 pills once a day    . Calcium Carbonate-Vitamin D (CALTRATE 600+D) 600-400 MG-UNIT per chew tablet Chew 1 tablet by mouth 2 (two) times daily.      . cholecalciferol (VITAMIN D) 1000 UNITS tablet Take 1,000 Units by mouth daily.    Marland Kitchen docusate sodium (COLACE) 100 MG capsule Take 100 mg by mouth daily.    Marland Kitchen escitalopram ([place] Flores) 10 MG tablet Take 1 tablet (10 mg total) by mouth daily. 30 tablet 5  . Fluticasone-Salmeterol (ADVAIR DISKUS) 100-50 MCG/DOSE AEPB USE 1 INHALATION BY MOUTH INTO THE LUNGS EVERY 12 HOURS 180 each 3  . HYDROcodone-acetaminophen (NORCO) 5-325 MG tablet Take 1 tablet by mouth every 6 (six) hours as needed for moderate pain. 20 tablet 0  . ipratropium (ATROVENT) 0.06 % nasal spray USE ONE SPRAY NASALLY THREE TIMES DAILY 45 mL 3  . levocetirizine (XYZAL) 5 MG tablet  Take 1 tablet (5 mg total) by mouth every evening. (Patient taking differently: Take 5 mg by mouth daily as needed for allergies. ) 30 tablet 5  . mometasone (ELOCON) 0.1 % cream Apply 1 application topically daily. (Patient taking differently: Apply 1 application topically daily as needed (skin irritations). ) 45 g 0  . montelukast (SINGULAIR) 10 MG tablet TAKE 1 TABLET BY MOUTH AT BEDTIME 90 tablet 3  . oxyCODONE-acetaminophen (ROXICET) 5-325 MG tablet Take 1-2 tablets by mouth every 6 (six) hours as needed for severe pain. 30 tablet 0  . pravastatin (PRAVACHOL) 40 MG tablet TAKE 1 BY MOUTH DAILY 90 tablet 3  . verapamil  (CALAN-SR) 240 MG CR tablet TAKE 1 BY MOUTH DAILY 90 tablet 3   No current facility-administered medications on file prior to visit.    Allergies  Allergen Reactions  . Penicillins Anaphylaxis  . Allegra [Fexofenadine Hydrochloride]     palpitations  . Desloratadine     Palpitations   . Cephalexin Hives and Rash   Social History   Social History  . Marital status: Married    Spouse name: N/A  . Number of children: N/A  . Years of education: N/A   Occupational History  . Not on file.   Social History Main Topics  . Smoking status: Never Smoker  . Smokeless tobacco: Never Used  . Alcohol use No  . Drug use: No  . Sexual activity: Not on file   Other Topics Concern  . Not on file   Social History Narrative  . No narrative on file     Review of Systems  All other systems reviewed and are negative.      Objective:   Physical Exam  HENT:  Nose: Nose normal.  Mouth/Throat: Oropharynx is clear and moist. No oropharyngeal exudate.  Neck: Neck supple.  Cardiovascular: Normal rate, regular rhythm and normal heart sounds.   No murmur heard. Pulmonary/Chest: Effort normal and breath sounds normal. No respiratory distress. She has no wheezes. She has no rales.  Abdominal: Soft. Bowel sounds are normal. She exhibits no distension. There is no tenderness. There is no rebound and no guarding.  Musculoskeletal: She exhibits no edema.  Lymphadenopathy:    She has no cervical adenopathy.  Vitals reviewed.         Assessment & Plan:  Pure hypercholesterolemia  HOCM (hypertrophic obstructive cardiomyopathy) (HCC)  Autoimmune hepatitis ([place])  Blood pressure today is outstanding. I will check a CBC, CMP, fasting lipid panel. Goal LDL cholesterol is less than 130. I will also monitor her liver function tests as well as her fasting blood sugar. I encouraged the patient to start water aerobics or some type of low impact exercise to improve her range of motion and her  strength. Her depression is better which I'm very thankful for. She is due for hepatitis C screening but the patient declines that. She is also listed as being due for a Pneumovax 23. She's had several blows the most recent being at age 79. She's also had Prevnar 13. Therefore I do not see the need for her to receive a pneumonia vaccine today. I did recommend a flu shot when I come out this fall

## 2016-12-01 NOTE — Telephone Encounter (Signed)
CB# [phone]  Patient would like a refill on her advair and pravastatin called into [facility] 90 day

## 2016-12-09 ENCOUNTER — Other Ambulatory Visit: Payer: Self-pay | Admitting: Family Medicine

## 2017-01-01 DIAGNOSIS — K635 Polyp of colon: Secondary | ICD-10-CM | POA: Diagnosis not present

## 2017-01-01 DIAGNOSIS — D122 Benign neoplasm of ascending colon: Secondary | ICD-10-CM | POA: Diagnosis not present

## 2017-01-01 DIAGNOSIS — Z1211 Encounter for screening for malignant neoplasm of colon: Secondary | ICD-10-CM | POA: Diagnosis not present

## 2017-01-01 DIAGNOSIS — K5140 Inflammatory polyps of colon without complications: Secondary | ICD-10-CM | POA: Diagnosis not present

## 2017-01-01 DIAGNOSIS — Z800 Family history of malignant neoplasm of digestive organs: Secondary | ICD-10-CM | POA: Diagnosis not present

## 2017-01-01 DIAGNOSIS — Z86010 Personal history of colonic polyps: Secondary | ICD-10-CM | POA: Diagnosis not present

## 2017-01-01 DIAGNOSIS — D125 Benign neoplasm of sigmoid colon: Secondary | ICD-10-CM | POA: Diagnosis not present

## 2017-01-05 DIAGNOSIS — K635 Polyp of colon: Secondary | ICD-10-CM | POA: Diagnosis not present

## 2017-01-05 DIAGNOSIS — D122 Benign neoplasm of ascending colon: Secondary | ICD-10-CM | POA: Diagnosis not present

## 2017-02-05 ENCOUNTER — Ambulatory Visit (INDEPENDENT_AMBULATORY_CARE_PROVIDER_SITE_OTHER): Payer: Medicare Other | Admitting: *Deleted

## 2017-02-05 DIAGNOSIS — Z23 Encounter for immunization: Secondary | ICD-10-CM

## 2017-05-06 ENCOUNTER — Other Ambulatory Visit: Payer: Self-pay | Admitting: Family Medicine

## 2017-05-06 MED ORDER — ESCITALOPRAM OXALATE 10 MG PO TABS: 10.0000 mg | ORAL_TABLET | Freq: Every day | ORAL | 3 refills | Status: DC

## 2017-05-15 ENCOUNTER — Other Ambulatory Visit: Payer: Self-pay | Admitting: Family Medicine

## 2017-05-15 DIAGNOSIS — J309 Allergic rhinitis, unspecified: Secondary | ICD-10-CM

## 2017-05-15 MED ORDER — LEVOCETIRIZINE DIHYDROCHLORIDE 5 MG PO TABS: 5.0000 mg | ORAL_TABLET | Freq: Every evening | ORAL | 3 refills | Status: DC

## 2017-06-08 ENCOUNTER — Ambulatory Visit: Payer: Medicare Other | Admitting: Family Medicine

## 2017-07-13 ENCOUNTER — Ambulatory Visit (INDEPENDENT_AMBULATORY_CARE_PROVIDER_SITE_OTHER): Payer: Medicare Other | Admitting: Family Medicine

## 2017-07-13 ENCOUNTER — Encounter: Payer: Self-pay | Admitting: Family Medicine

## 2017-07-13 VITALS — BP 126/68 | HR 58 | Temp 97.6°F | Resp 18 | Ht 67.5 in | Wt 177.0 lb

## 2017-07-13 DIAGNOSIS — I421 Obstructive hypertrophic cardiomyopathy: Secondary | ICD-10-CM

## 2017-07-13 DIAGNOSIS — E7800 Pure hypercholesterolemia, unspecified: Secondary | ICD-10-CM

## 2017-07-13 DIAGNOSIS — K754 Autoimmune hepatitis: Secondary | ICD-10-CM | POA: Diagnosis not present

## 2017-07-13 DIAGNOSIS — F320 Major depressive disorder, single episode, mild: Secondary | ICD-10-CM

## 2017-07-13 NOTE — Progress Notes (Signed)
Subjective:    Patient ID: Veronica Flores, female    DOB: 1945-10-01, 72 y.o.   MRN: [id number]  Medication Refill   Sinus Problem    06/02/16 Patient is here today for her regular follow-up. However she reports depression. She reports anhedonia. She reports difficulty sleeping and occasional anxiety. She reports panic attacks on occasion. The majority of the stems from dealing with her husband's Parkinson's disease which is steadily declining. She is also battling colon cancer in her daughter which has her worried because she could not go to [place] where her daughter lives in help her recover. Her mother also died last year after a prolonged battle with dementia. Otherwise she is doing relatively well. She denies any chest pain shortness of breath or dyspnea on exertion.  At that time, my plan was: I will check a CMP and a fasting lipid panel. Goal LDL cholesterol is less than 130. Blood pressure today is excellent at 126/80. She has no symptoms of concern regarding her hypertrophic cardiomyopathy. She denies any chest pain or syncope or presyncope or palpitations.  Regarding her depression, I will treat the patient with Lexapro 10 mg a day and recheck in 4-6 weeks  12/01/16 Patient states that her depression is doing better on Lexapro. However she is only able to tolerate 5 mg a day. She states that 10 mg a day made her feel like a "zombie". She denies any diarrhea or stomach irritation on the medication. She denies any chest pain shortness of breath or dyspnea on exertion. Her weight remained stable as it has been for more than a year. Her blood pressure today is excellent 126/74. She denies any palpitations or syncope or presyncope. She denies any jaundice or abdominal discomfort. She is due today for fasting lab work.  At that time, my plan was: Blood pressure today is outstanding. I will check a CBC, CMP, fasting lipid panel. Goal LDL cholesterol is less than 130. I will also monitor her  liver function tests as well as her fasting blood sugar. I encouraged the patient to start water aerobics or some type of low impact exercise to improve her range of motion and her strength. Her depression is better which I'm very thankful for. She is due for hepatitis C screening but the patient declines that. She is also listed as being due for a Pneumovax 23. She's had several blows the most recent being at age 30. She's also had Prevnar 13. Therefore I do not see the need for her to receive a pneumonia vaccine today. I did recommend a flu shot when I come out this fall   07/13/17 From the standpoint of her depression, she feels much better.  She is only taking 5 mg of Lexapro every day however she is sleeping better and she feels calmer.  She is hesitant to make any changes in the medication although she feels that it may be a placebo effect.  She is taking an aspirin every day with no history of heart disease.  I recommended that she discontinue that.  She does have a history of hypertrophic cardiomyopathy but no history of coronary artery disease.  She denies any chest pain shortness of breath syncope or near syncope.  She denies any tachycardia or irregular heartbeats.  She denies any myalgias or right upper quadrant pain.  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  She is due today to recheck a CMP and  monitor her fasting lipid panel.  Regarding her osteopenia, she is scheduled for a bone density test later this week.  She is compliant with her calcium and her vitamin D Past Medical History:  Diagnosis Date  . Allergy   . Asthma   . Autoimmune hepatitis ([place])   . Fatigue   . Hypertension   . Hypertrophic cardiomyopathy ([place])   . Migraine, ophthalmoplegic   . Osteopenia    Past Surgical History:  Procedure Laterality Date  . ANKLE SURGERY    . BRAIN SURGERY    . CHOLECYSTECTOMY    . IR RADIOLOGIST EVAL & MGMT  08/26/2016  . IR  VERTEBROPLASTY LUMBAR BX INC UNI/BIL INC/INJECT/IMAGING  07/30/2016  . WRIST SURGERY     Current Outpatient Medications on File Prior to Visit  Medication Sig Dispense Refill  . aspirin 325 MG tablet Take 325 mg by mouth daily.    Marland Kitchen azaTHIOprine (IMURAN) 50 MG tablet Take 100 mg by mouth daily. 2 pills once a day    . Calcium Carbonate-Vitamin D (CALTRATE 600+D) 600-400 MG-UNIT per chew tablet Chew 1 tablet by mouth 2 (two) times daily.      . cholecalciferol (VITAMIN D) 1000 UNITS tablet Take 1,000 Units by mouth daily.    Marland Kitchen docusate sodium (COLACE) 100 MG capsule Take 100 mg by mouth daily.    Marland Kitchen escitalopram (LEXAPRO) 10 MG tablet Take 1 tablet (10 mg total) by mouth daily. 90 tablet 3  . Fluticasone-Salmeterol (ADVAIR DISKUS) 100-50 MCG/DOSE AEPB USE 1 INHALATION BY MOUTH INTO THE LUNGS EVERY 12 HOURS 180 each 3  . HYDROcodone-acetaminophen (NORCO) 5-325 MG tablet Take 1 tablet by mouth every 6 (six) hours as needed for moderate pain. (Patient not taking: Reported on 12/01/2016) 20 tablet 0  . ipratropium (ATROVENT) 0.06 % nasal spray USE ONE SPRAY NASALLY THREE TIMES DAILY 45 mL 3  . levocetirizine (XYZAL) 5 MG tablet Take 1 tablet (5 mg total) by mouth every evening. 90 tablet 3  . mometasone (ELOCON) 0.1 % cream Apply 1 application topically daily. (Patient taking differently: Apply 1 application topically daily as needed (skin irritations). ) 45 g 0  . montelukast (SINGULAIR) 10 MG tablet TAKE 1 TABLET BY MOUTH AT BEDTIME 90 tablet 3  . oxyCODONE-acetaminophen (ROXICET) 5-325 MG tablet Take 1-2 tablets by mouth every 6 (six) hours as needed for severe pain. (Patient not taking: Reported on 12/01/2016) 30 tablet 0  . pravastatin (PRAVACHOL) 40 MG tablet TAKE 1 BY MOUTH DAILY 90 tablet 3  . pravastatin (PRAVACHOL) 40 MG tablet TAKE 1 TABLET BY MOUTH DAILY 90 tablet 3  . verapamil (CALAN-SR) 240 MG CR tablet TAKE 1 BY MOUTH DAILY 90 tablet 3   No current facility-administered medications on  file prior to visit.    Allergies  Allergen Reactions  . Penicillins Anaphylaxis  . Allegra [Fexofenadine Hydrochloride]     palpitations  . Desloratadine     Palpitations   . Cephalexin Hives and Rash   Social History   Socioeconomic History  . Marital status: Married    Spouse name: Not on file  . Number of children: Not on file  . Years of education: Not on file  . Highest education level: Not on file  Occupational History  . Not on file  Social Needs  . Financial resource strain: Not on file  . Food insecurity:    Worry: Not on file    Inability: Not on file  . Transportation needs:  Medical: Not on file    Non-medical: Not on file  Tobacco Use  . Smoking status: Never Smoker  . Smokeless tobacco: Never Used  Substance and Sexual Activity  . Alcohol use: No  . Drug use: No  . Sexual activity: Not on file  Lifestyle  . Physical activity:    Days per week: Not on file    Minutes per session: Not on file  . Stress: Not on file  Relationships  . Social connections:    Talks on phone: Not on file    Gets together: Not on file    Attends religious service: Not on file    Active member of club or organization: Not on file    Attends meetings of clubs or organizations: Not on file    Relationship status: Not on file  . Intimate partner violence:    Fear of current or ex partner: Not on file    Emotionally abused: Not on file    Physically abused: Not on file    Forced sexual activity: Not on file  Other Topics Concern  . Not on file  Social History Narrative  . Not on file     Review of Systems  All other systems reviewed and are negative.      Objective:   Physical Exam  HENT:  Nose: Nose normal.  Mouth/Throat: Oropharynx is clear and moist. No oropharyngeal exudate.  Neck: Neck supple.  Cardiovascular: Normal rate, regular rhythm and normal heart sounds.  No murmur heard. Pulmonary/Chest: Effort normal and breath sounds normal. No respiratory  distress. She has no wheezes. She has no rales.  Abdominal: Soft. Bowel sounds are normal. She exhibits no distension. There is no tenderness. There is no rebound and no guarding.  Musculoskeletal: She exhibits no edema.  Lymphadenopathy:    She has no cervical adenopathy.  Vitals reviewed.         Assessment & Plan:  Pure hypercholesterolemia - Plan: CBC with Differential/Platelet, COMPLETE METABOLIC PANEL WITH GFR, Lipid panel  HOCM (hypertrophic obstructive cardiomyopathy) (HCC)  Autoimmune hepatitis (HCC)  Depression, major, single episode, mild (HCC)  Check CMP and fasting lipid panel.  Goal LDL cholesterol is less than 130.  Monitor liver function test.  Patient's blood pressure is well controlled and her heart rate is well regulated and she is asymptomatic from a standpoint of her hypertrophic cardiomyopathy.  Recommended discontinuation of aspirin.  Recommended continuing Lexapro 5 mg a day although I am not sure that the medication is was causing her to feel better or if it could simply be the passage of time.  However the patient is comfortable on the medicine and she would like to continue it.

## 2017-07-14 LAB — COMPLETE METABOLIC PANEL WITH GFR
AG RATIO: 1.7 (calc) (ref 1.0–2.5)
ALT: 17 U/L (ref 6–29)
AST: 24 U/L (ref 10–35)
Albumin: 4.4 g/dL (ref 3.6–5.1)
Alkaline phosphatase (APISO): 49 U/L (ref 33–130)
BUN: 16 mg/dL (ref 7–25)
CO2: 23 mmol/L (ref 20–32)
Calcium: 9.7 mg/dL (ref 8.6–10.4)
Chloride: 109 mmol/L (ref 98–110)
Creat: 0.88 mg/dL (ref 0.60–0.93)
GFR, Est African American: 77 mL/min/{1.73_m2} (ref >=60)
GFR, Est Non African American: 66 mL/min/{1.73_m2} (ref >=60)
GLOBULIN: 2.6 g/dL (ref 1.9–3.7)
Glucose, Bld: 102 mg/dL — ABNORMAL HIGH (ref 65–99)
POTASSIUM: 4.5 mmol/L (ref 3.5–5.3)
Sodium: 140 mmol/L (ref 135–146)
TOTAL PROTEIN: 7 g/dL (ref 6.1–8.1)
Total Bilirubin: 0.7 mg/dL (ref 0.2–1.2)

## 2017-07-14 LAB — CBC WITH DIFFERENTIAL/PLATELET
BASOS PCT: 0.2 %
Basophils Absolute: 9 cells/uL (ref 0–200)
Eosinophils Absolute: 9 cells/uL — ABNORMAL LOW (ref 15–500)
Eosinophils Relative: 0.2 %
HEMATOCRIT: 44.2 % (ref 35.0–45.0)
Hemoglobin: 15.3 g/dL (ref 11.7–15.5)
LYMPHS ABS: 1288 {cells}/uL (ref 850–3900)
MCH: 30.2 pg (ref 27.0–33.0)
MCHC: 34.6 g/dL (ref 32.0–36.0)
MCV: 87.2 fL (ref 80.0–100.0)
MPV: 9.5 fL (ref 7.5–12.5)
Monocytes Relative: 7 %
NEUTROS ABS: 2972 {cells}/uL (ref 1500–7800)
Neutrophils Relative %: 64.6 %
PLATELETS: 317 10*3/uL (ref 140–400)
RBC: 5.07 10*6/uL (ref 3.80–5.10)
RDW: 14.4 % (ref 11.0–15.0)
Total Lymphocyte: 28 %
WBC: 4.6 10*3/uL (ref 3.8–10.8)
WBCMIX: 322 {cells}/uL (ref 200–950)

## 2017-07-14 LAB — LIPID PANEL
Cholesterol: 121 mg/dL (ref <200)
HDL: 33 mg/dL — ABNORMAL LOW (ref >50)
LDL CHOLESTEROL (CALC): 66 mg/dL
NON-HDL CHOLESTEROL (CALC): 88 mg/dL (ref <130)
TRIGLYCERIDES: 134 mg/dL (ref <150)
Total CHOL/HDL Ratio: 3.7 (calc) (ref <5.0)

## 2017-07-15 DIAGNOSIS — M8589 Other specified disorders of bone density and structure, multiple sites: Secondary | ICD-10-CM | POA: Diagnosis not present

## 2017-07-15 LAB — HM DEXA SCAN

## 2017-07-20 IMAGING — DX DG LUMBAR SPINE COMPLETE 4+V
5 series · 5 of 5 positions shown · non-contrast
Comparison: Chest radiograph 12/06/2012.

CLINICAL DATA: Patient fell today raking leaves. Low back pain more
to the LEFT.

EXAM:
LUMBAR SPINE - COMPLETE 4+ VIEW

[l-spine ap]
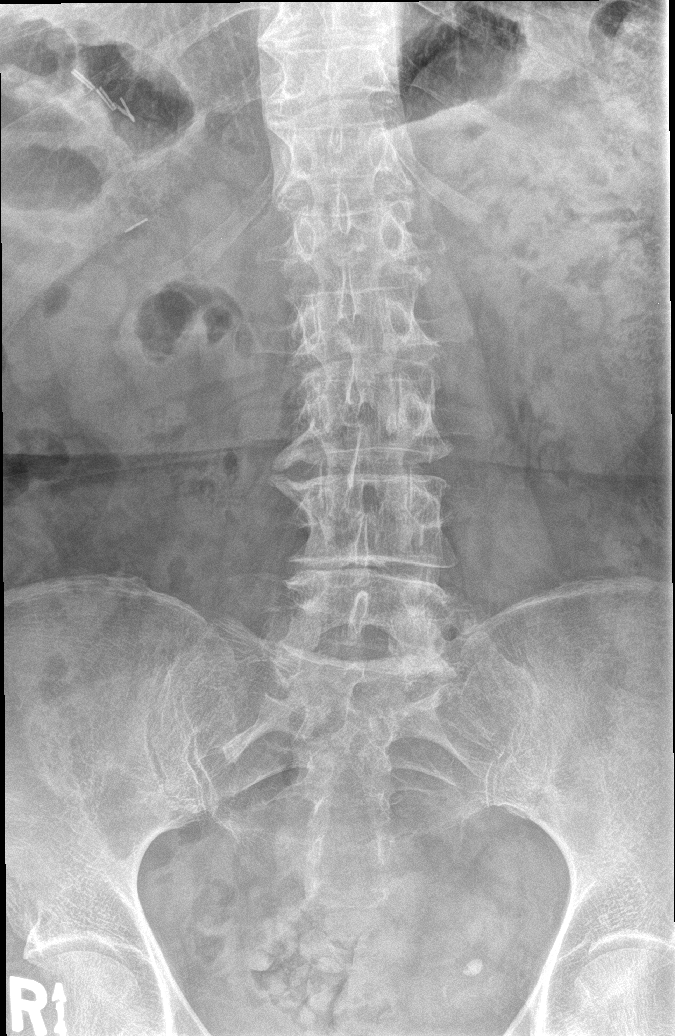

[l-spine obl (1 of 2)]
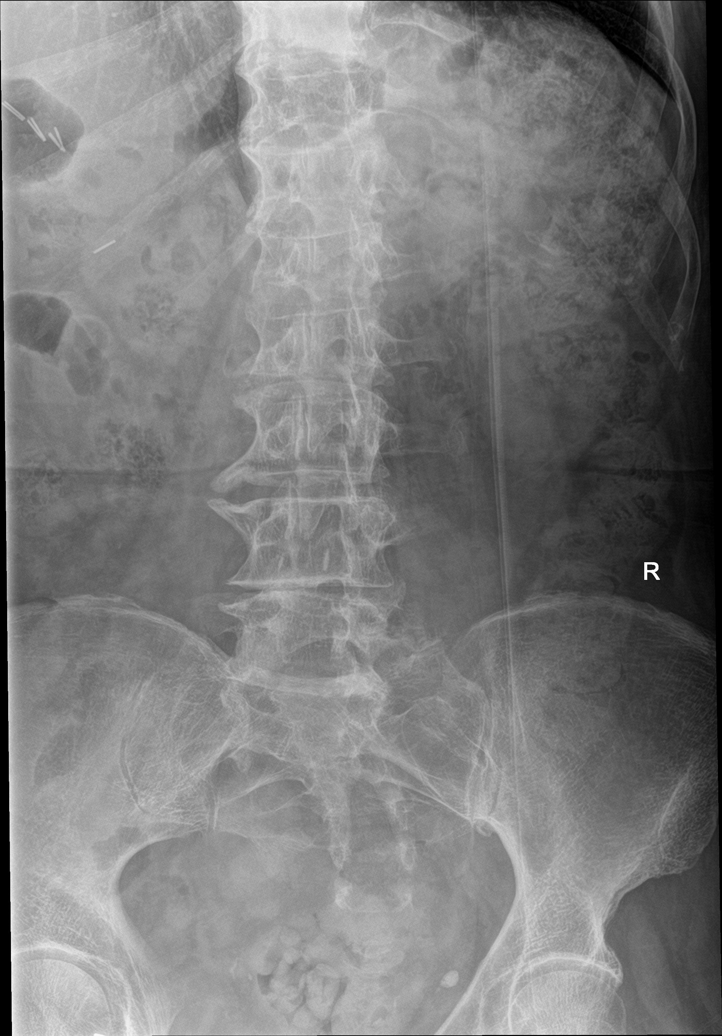

[l-spine obl (2 of 2)]
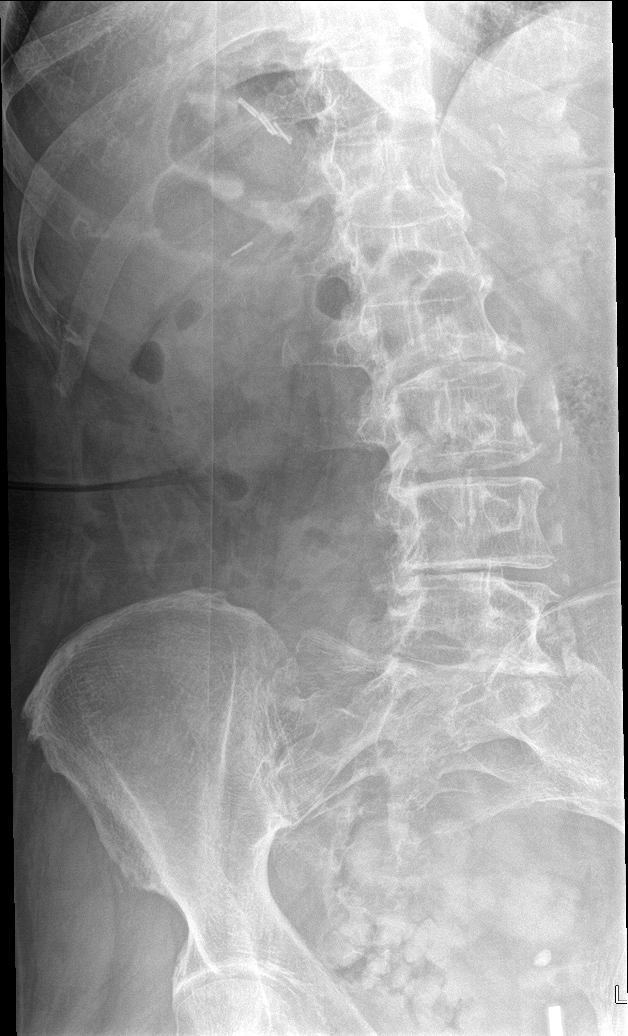

[l-spine lat]
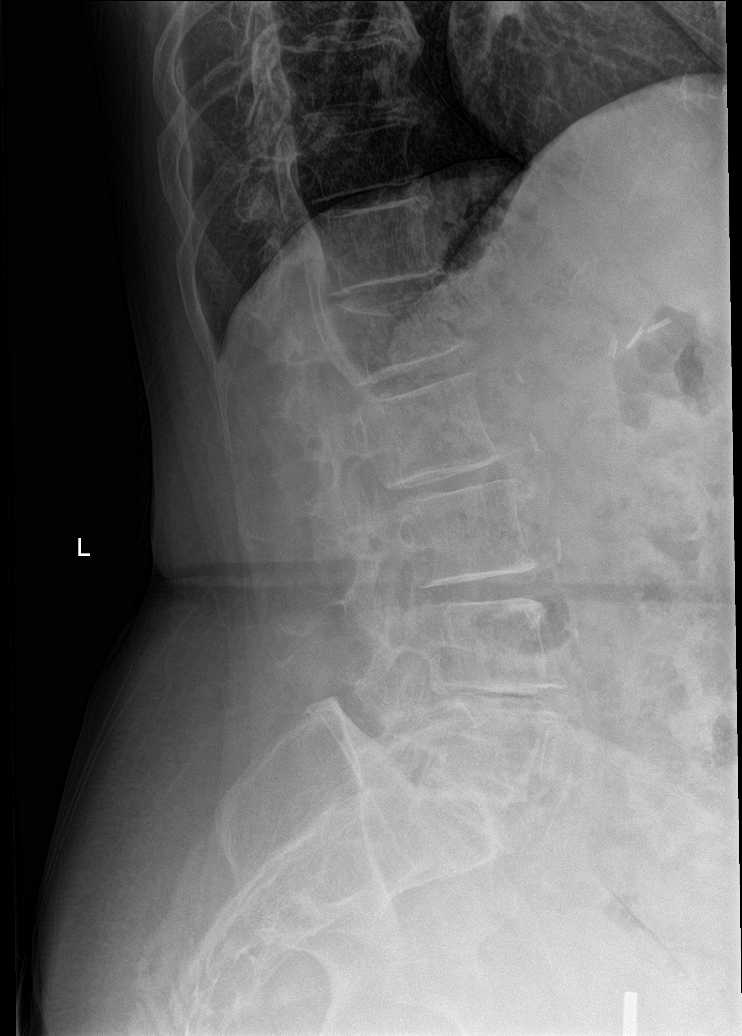

[l-spine spot]
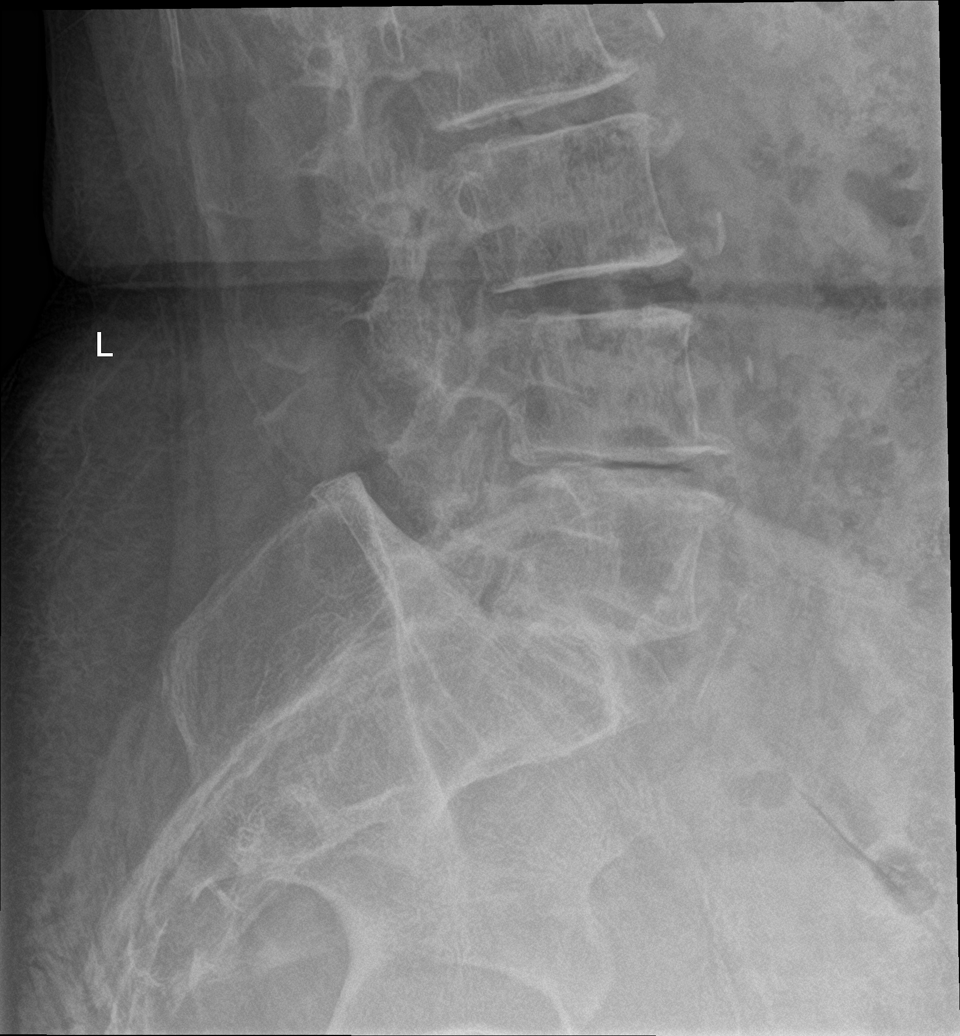

[5 of 5 positions shown; findings below may reference images not displayed]

FINDINGS: Skeletal osteopenia is noted. There is asymmetric loss of interspace
height at L4-5 on the RIGHT which contributes to mild degenerative
scoliosis convex LEFT. Osseous spurring is seen at multiple levels.

There is an indeterminate age compression fracture of L1. Slight
anterior wedging and superior endplate depression. Correlate
clinically for tenderness at the thoracolumbar junction.

Lower lumbar facet arthropathy. No pelvic abnormalities.
Cholecystectomy. Moderate stool burden.

Compared with the prior chest radiograph, L1 appeared normal in
6324.
IMPRESSION: Query acute compression fracture of L1.

Chronic lumbar spondylosis as described.

## 2017-07-22 ENCOUNTER — Other Ambulatory Visit: Payer: Self-pay

## 2017-07-22 ENCOUNTER — Ambulatory Visit (INDEPENDENT_AMBULATORY_CARE_PROVIDER_SITE_OTHER): Payer: Medicare Other | Admitting: Family Medicine

## 2017-07-22 DIAGNOSIS — R300 Dysuria: Secondary | ICD-10-CM | POA: Diagnosis not present

## 2017-07-22 DIAGNOSIS — N3001 Acute cystitis with hematuria: Secondary | ICD-10-CM

## 2017-07-22 LAB — URINALYSIS, ROUTINE W REFLEX MICROSCOPIC
BILIRUBIN URINE: NEGATIVE
GLUCOSE, UA: NEGATIVE
Nitrite: POSITIVE — AB
Specific Gravity, Urine: 1.02 (ref 1.001–1.03)
pH: 5.5 (ref 5.0–8.0)

## 2017-07-22 LAB — MICROSCOPIC MESSAGE

## 2017-07-22 MED ORDER — NITROFURANTOIN MONOHYD MACRO 100 MG PO CAPS: 100.0000 mg | ORAL_CAPSULE | Freq: Two times a day (BID) | ORAL | 0 refills | Status: DC

## 2017-07-22 NOTE — Patient Instructions (Signed)
We will call you with the urine culture results.  If not improving please come in to be rechecked.  You should start to feel much better in 2-3 days.   Urinary Tract Infection, Adult A urinary tract infection (UTI) is an infection of any part of the urinary tract. The urinary tract includes the:  Kidneys.  Ureters.  Bladder.  Urethra.  These organs make, store, and get rid of pee (urine) in the body. Follow these instructions at home:  Take over-the-counter and prescription medicines only as told by your doctor.  If you were prescribed an antibiotic medicine, take it as told by your doctor. Do not stop taking the antibiotic even if you start to feel better.  Avoid the following drinks: ? Alcohol. ? Caffeine. ? Tea. ? Carbonated drinks.  Drink enough fluid to keep your pee clear or pale yellow.  Keep all follow-up visits as told by your doctor. This is important.  Make sure to: ? Empty your bladder often and completely. Do not to hold pee for long periods of time. ? Empty your bladder before and after sex. ? Wipe from front to back after a bowel movement if you are female. Use each tissue one time when you wipe. Contact a doctor if:  You have back pain.  You have a fever.  You feel sick to your stomach (nauseous).  You throw up (vomit).  Your symptoms do not get better after 3 days.  Your symptoms go away and then come back. Get help right away if:  You have very bad back pain.  You have very bad lower belly (abdominal) pain.  You are throwing up and cannot keep down any medicines or water. This information is not intended to replace advice given to you by your health care provider. Make sure you discuss any questions you have with your health care provider. Document Released: 09/24/2007 Document Revised: 09/13/2015 Document Reviewed: 02/26/2015 Elsevier Interactive Patient Education  [employer].

## 2017-07-22 NOTE — Progress Notes (Signed)
Patient ID: Veronica Flores, female    DOB: February 16, 1946, 72 y.o.   MRN: [id number]  PCP: Susy Frizzle, MD  Chief Complaint  Patient presents with  . Urinary Tract Infection    Has c/o of tenderness and pain in lower abdomen, Onset of symptoms yesterday. Has tried Azo. Also has concerns of finger numbness.     Subjective:   Veronica Flores is a 72 y.o. female, with chief complaint of 2 days of dysuria with urinary frequency and urgency.  She describes pain as pelvic pressure and burning, that began intermittently and now is constant.  She began taking Azo yesterday and it improved her pain and urinary frequency.  Yesterday she felt more fatigued than normal.  She denies any nausea, vomiting, sweats, abdominal pain, flank pain.  She states that years ago she passed a kidney stone but this does not feel similar to that.  She denies any hematuria.    Patient Active Problem List   Diagnosis Date Noted  . Hypertension 07/06/2010  . Seasonal allergies 07/06/2010  . Fatigue 07/06/2010  . Asthma 07/06/2010  . Autoimmune hepatitis ([place]) 07/06/2010  . 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 Meningioma ([place]) 07/06/2010  . S/P cholecystectomy 07/06/2010  . Osteopenia 07/06/2010  . HYPERTROPHIC OBSTRUCTIVE CARDIOMYOPATHY 02/20/2010    Prior to Admission medications   Medication Sig Start Date End Date Taking? Authorizing Provider  azaTHIOprine (IMURAN) 50 MG tablet Take 100 mg by mouth daily. 2 pills once a day   Yes [provider]  Calcium Carbonate-Vitamin D (CALTRATE 600+D) 600-400 MG-UNIT per chew tablet Chew 1 tablet by mouth 2 (two) times daily.     Yes [provider]  cholecalciferol (VITAMIN D) 1000 UNITS tablet Take 1,000 Units by mouth daily.   Yes [provider]  docusate sodium (COLACE) 100 MG capsule Take 100 mg by mouth daily.   Yes [provider]  escitalopram (LEXAPRO) 10 MG tablet Take 1 tablet (10 mg total) by mouth daily. 05/06/17  Yes  Susy Frizzle, MD  Fluticasone-Salmeterol (ADVAIR DISKUS) 100-50 MCG/DOSE AEPB USE 1 INHALATION BY MOUTH INTO THE LUNGS EVERY 12 HOURS 12/01/16  Yes Susy Frizzle, MD  ipratropium (ATROVENT) 0.06 % nasal spray USE ONE SPRAY NASALLY THREE TIMES DAILY 03/19/16  Yes Susy Frizzle, MD  levocetirizine (XYZAL) 5 MG tablet Take 1 tablet (5 mg total) by mouth every evening. 05/15/17  Yes Susy Frizzle, MD  mometasone (ELOCON) 0.1 % cream Apply 1 application topically daily. Patient taking differently: Apply 1 application topically daily as needed (skin irritations).  10/29/15  Yes Susy Frizzle, MD  montelukast (SINGULAIR) 10 MG tablet TAKE 1 TABLET BY MOUTH AT BEDTIME 09/16/16  Yes Susy Frizzle, MD  pravastatin (PRAVACHOL) 40 MG tablet TAKE 1 BY MOUTH DAILY 12/01/16  Yes Susy Frizzle, MD  verapamil (CALAN-SR) 240 MG CR tablet TAKE 1 BY MOUTH DAILY 08/27/16  Yes Susy Frizzle, MD  aspirin 325 MG tablet Take 325 mg by mouth daily.    [provider]  nitrofurantoin, macrocrystal-monohydrate, (MACROBID) 100 MG capsule Take 1 capsule (100 mg total) by mouth 2 (two) times daily. 07/22/17   Delsa Grana, PA-C     Allergies  Allergen Reactions  . Penicillins Anaphylaxis  . Allegra [Fexofenadine Hydrochloride]     palpitations  . Desloratadine     Palpitations   . Cephalexin Hives and Rash     No family history on file.  Social History   Socioeconomic History  . Marital status: Married    Spouse name: Not on file  . Number of children: Not on file  . Years of education: Not on file  . Highest education level: Not on file  Occupational History  . Not on file  Social Needs  . Financial resource strain: Not on file  . Food insecurity:    Worry: Not on file    Inability: Not on file  . Transportation needs:    Medical: Not on file    Non-medical: Not on file  Tobacco Use  . Smoking status: Never Smoker  . Smokeless tobacco: Never Used  Substance and  Sexual Activity  . Alcohol use: No  . Drug use: No  . Sexual activity: Not on file  Lifestyle  . Physical activity:    Days per week: Not on file    Minutes per session: Not on file  . Stress: Not on file  Relationships  . Social connections:    Talks on phone: Not on file    Gets together: Not on file    Attends religious service: Not on file    Active member of club or organization: Not on file    Attends meetings of clubs or organizations: Not on file    Relationship status: Not on file  . Intimate partner violence:    Fear of current or ex partner: Not on file    Emotionally abused: Not on file    Physically abused: Not on file    Forced sexual activity: Not on file  Other Topics Concern  . Not on file  Social History Narrative  . Not on file     Review of Systems  Constitutional: Negative.   HENT: Negative.   Respiratory: Negative.   Cardiovascular: Negative.   Gastrointestinal: Negative.   Genitourinary: Negative for decreased urine volume and difficulty urinating.  Musculoskeletal: Negative.   Neurological: Negative.   Hematological: Negative.   All other systems reviewed and are negative.      Objective:    Vitals:   07/22/17 0801  BP: 128/80  Temp: (!) 97.5 F (36.4 C)  TempSrc: Oral  Weight: 178 lb (80.7 kg)  Height: 5' 7.5" (1.715 m)     Physical Exam  Constitutional: She is oriented to person, place, and time. She appears well-developed and well-nourished.  Non-toxic appearance. No distress.  HENT:  Head: Normocephalic and atraumatic.  Right Ear: External ear normal.  Left Ear: External ear normal.  Nose: Nose normal.  Mouth/Throat: Uvula is midline, oropharynx is clear and moist and mucous membranes are normal.  Eyes: Pupils are equal, round, and reactive to light. Conjunctivae and lids are normal.  Neck: Normal range of motion and phonation normal. Neck supple. No tracheal deviation present.  Cardiovascular: Normal rate, regular rhythm  and normal heart sounds. Exam reveals no gallop and no friction rub.  No murmur heard. Pulses:      Radial pulses are 2+ on the right side, and 2+ on the left side.       Posterior tibial pulses are 2+ on the right side, and 1+ on the left side.  Pulmonary/Chest: Effort normal and breath sounds normal. No stridor. No respiratory distress. She has no wheezes. She has no rhonchi. She has no rales.  Abdominal: Soft. Normal appearance and bowel sounds are normal. She exhibits no distension. There is no rebound and no guarding.  Mild suprapubic tenderness without guarding or rebound, no flank  pain bilaterally  Musculoskeletal: Normal range of motion. She exhibits no edema or deformity.  Lymphadenopathy:    She has no cervical adenopathy.  Neurological: She is alert and oriented to person, place, and time. She exhibits normal muscle tone. Gait normal.  Skin: Skin is warm, dry and intact. Capillary refill takes less than 2 seconds. No rash noted. She is not diaphoretic. No pallor.  Psychiatric: She has a normal mood and affect. Her speech is normal and behavior is normal.            Assessment & Plan:    1. Acute cystitis with hematuria   - Urinalysis, Routine w reflex microscopic Urine dip was positive for nitrites leukocyte esterase, she did take Azo, microscopy showed many bacteria, urine culture ordered - Urine Culture  Reviewed past urine cultures, last was positive for E. coli.  Will cover with Macrobid, due to patient's allergy profile.  Discussed plan with patient, follow-up as needed, return precautions reviewed.  Delsa Grana, PA-C 07/22/17 8:52 AM

## 2017-07-24 ENCOUNTER — Encounter: Payer: Self-pay | Admitting: Family Medicine

## 2017-07-24 LAB — URINE CULTURE
MICRO NUMBER:: 90412012
SPECIMEN QUALITY:: ADEQUATE

## 2017-07-24 NOTE — Progress Notes (Signed)
Preliminary urine culture results shows E. coli bacteria, based on her last culture that also was E. coli Macrobid should be effective.  She should finish antibiotics.  We will call her if we need to change anything when we get the rest of the report (sensitivity report).

## 2017-07-24 NOTE — Progress Notes (Signed)
Culture and sensitivity now resulted, isolate of E. coli, it is sensitive to nitrofurantoin patient should finish her antibiotics.  Seek follow-up if not improving the next 1-2 days.

## 2017-07-25 ENCOUNTER — Encounter: Payer: Self-pay | Admitting: Family Medicine

## 2017-07-27 MED ORDER — IPRATROPIUM BROMIDE 0.06 % NA SOLN: NASAL | 3 refills | Status: DC

## 2017-07-28 ENCOUNTER — Encounter: Payer: Self-pay | Admitting: *Deleted

## 2017-08-06 ENCOUNTER — Telehealth: Payer: Self-pay | Admitting: Family Medicine

## 2017-08-06 ENCOUNTER — Other Ambulatory Visit: Payer: Self-pay | Admitting: Family Medicine

## 2017-08-06 DIAGNOSIS — M79644 Pain in right finger(s): Secondary | ICD-10-CM

## 2017-08-06 NOTE — Telephone Encounter (Signed)
See prior note with orders for pt's complaint

## 2017-08-06 NOTE — Telephone Encounter (Signed)
Pt called stating her finger is still giving her a fit, wants to know if we can go ahead and put order in for x ray as discussed at [place]. Please advise pt.

## 2017-08-06 NOTE — Progress Notes (Signed)
Patient called to see if x-ray could be ordered for him pain and swelling of her right middle finger.  She mentioned this briefly at her last visit but minimized.  There was no injury.  She had some intermittent numbness and tingling when it first began 2 to 3 weeks ago but she continues to have mild swelling pain and stiffness in her right middle finger.  Visit was 07/22/17 for urinary complaints.  She had mild edema, normal sensation and capillary refill, no erythema, I had no concern for cellulitis.  We both agreed at that time that it would likely just gradually get better on its own.  X-ray ordered.  Patient advised to follow-up in office for more formal evaluation if any of her symptoms worsen, seek urgent evaluation if any concerning redness or swelling or any cyanosis.  1. Pain of right middle finger  - DG Finger Middle Right; Future

## 2017-08-07 ENCOUNTER — Ambulatory Visit
Admission: RE | Admit: 2017-08-07 | Discharge: 2017-08-07 | Disposition: A | Discharge status: Home / Self Care | Payer: Medicare Other | Source: Ambulatory Visit | Attending: Family Medicine | Admitting: Family Medicine

## 2017-08-07 DIAGNOSIS — M79644 Pain in right finger(s): Secondary | ICD-10-CM | POA: Diagnosis not present

## 2017-08-10 NOTE — Progress Notes (Signed)
xrays normal, if still symptoms worsen, or if there is color change of the skin, she would need to be seen again.

## 2017-08-26 ENCOUNTER — Other Ambulatory Visit: Payer: Self-pay | Admitting: Family Medicine

## 2017-09-24 ENCOUNTER — Other Ambulatory Visit: Payer: Self-pay | Admitting: Family Medicine

## 2017-09-24 ENCOUNTER — Ambulatory Visit (INDEPENDENT_AMBULATORY_CARE_PROVIDER_SITE_OTHER): Payer: Medicare Other | Admitting: Family Medicine

## 2017-09-24 VITALS — BP 130/80 | HR 62 | Temp 97.6°F | Resp 16 | Wt 177.0 lb

## 2017-09-24 DIAGNOSIS — M25561 Pain in right knee: Secondary | ICD-10-CM

## 2017-09-24 MED ORDER — MELOXICAM 15 MG PO TABS: 15.0000 mg | ORAL_TABLET | Freq: Every day | ORAL | 0 refills | Status: DC

## 2017-09-24 NOTE — Progress Notes (Signed)
Subjective:    Patient ID: Veronica Flores, female    DOB: 1946-01-29, 72 y.o.   MRN: [id number]  HPI Patient's pain began gradually 1 week ago.  Pain is located primarily in the posterior aspect of her right knee.  She denies any fall.  She denies any injury.  Over the weekend, she was working in the garden, bending and stooping, pulling weeds, etc.  Sunday morning, she woke up and was barely able to put weight on her knee.  She reports a mild effusion in the knee.  She reports pain with ambulation.  She denies any fall.  She denies any injury.  She believes she "overdid it".  She tried to stay off of it Sunday Monday.  The pain has not worsened however it has not gotten much better.  On exam today there is no erythema.  There is no warmth.  There is a mild effusion.  She has a negative Apley grind.  She has no laxity to varus or valgus stress.  She has a negative anterior and posterior drawer sign Past Medical History:  Diagnosis Date  . Allergy   . Asthma   . Autoimmune hepatitis ([place])   . Fatigue   . Hypertension   . Hypertrophic cardiomyopathy ([place])   . Migraine, ophthalmoplegic   . Osteopenia    Past Surgical History:  Procedure Laterality Date  . ANKLE SURGERY    . BRAIN SURGERY    . CHOLECYSTECTOMY    . IR RADIOLOGIST EVAL & MGMT  08/26/2016  . IR VERTEBROPLASTY LUMBAR BX INC UNI/BIL INC/INJECT/IMAGING  07/30/2016  . WRIST SURGERY     Current Outpatient Medications on File Prior to Visit  Medication Sig Dispense Refill  . azaTHIOprine (IMURAN) 50 MG tablet Take 100 mg by mouth daily. 2 pills once a day    . Calcium Carbonate-Vitamin D (CALTRATE 600+D) 600-400 MG-UNIT per chew tablet Chew 1 tablet by mouth 2 (two) times daily.      . cholecalciferol (VITAMIN D) 1000 UNITS tablet Take 1,000 Units by mouth daily.    Marland Kitchen docusate sodium (COLACE) 100 MG capsule Take 100 mg by mouth daily.    . Fluticasone-Salmeterol (ADVAIR DISKUS) 100-50 MCG/DOSE AEPB USE 1 INHALATION BY MOUTH INTO  THE LUNGS EVERY 12 HOURS 180 each 3  . ipratropium (ATROVENT) 0.06 % nasal spray USE ONE SPRAY NASALLY THREE TIMES DAILY 45 mL 3  . levocetirizine (XYZAL) 5 MG tablet Take 1 tablet (5 mg total) by mouth every evening. 90 tablet 3  . mometasone (ELOCON) 0.1 % cream Apply 1 application topically daily. (Patient taking differently: Apply 1 application topically daily as needed (skin irritations). ) 45 g 0  . montelukast (SINGULAIR) 10 MG tablet TAKE 1 TABLET BY MOUTH AT BEDTIME 90 tablet 3  . pravastatin (PRAVACHOL) 40 MG tablet TAKE 1 BY MOUTH DAILY 90 tablet 3  . verapamil (CALAN-SR) 240 MG CR tablet TAKE 1 TABLET BY MOUTH DAILY 90 tablet 0  . escitalopram (LEXAPRO) 10 MG tablet Take 1 tablet (10 mg total) by mouth daily. (Patient not taking: Reported on 09/24/2017) 90 tablet 3   No current facility-administered medications on file prior to visit.    Allergies  Allergen Reactions  . Penicillins Anaphylaxis  . Allegra [Fexofenadine Hydrochloride]     palpitations  . Desloratadine     Palpitations   . Cephalexin Hives and Rash   Social History   Socioeconomic History  . Marital status: Married    Spouse  name: Not on file  . Number of children: Not on file  . Years of education: Not on file  . Highest education level: Not on file  Occupational History  . Not on file  Social Needs  . Financial resource strain: Not on file  . Food insecurity:    Worry: Not on file    Inability: Not on file  . Transportation needs:    Medical: Not on file    Non-medical: Not on file  Tobacco Use  . Smoking status: Never Smoker  . Smokeless tobacco: Never Used  Substance and Sexual Activity  . Alcohol use: No  . Drug use: No  . Sexual activity: Not on file  Lifestyle  . Physical activity:    Days per week: Not on file    Minutes per session: Not on file  . Stress: Not on file  Relationships  . Social connections:    Talks on phone: Not on file    Gets together: Not on file    Attends  religious service: Not on file    Active member of club or organization: Not on file    Attends meetings of clubs or organizations: Not on file    Relationship status: Not on file  . Intimate partner violence:    Fear of current or ex partner: Not on file    Emotionally abused: Not on file    Physically abused: Not on file    Forced sexual activity: Not on file  Other Topics Concern  . Not on file  Social History Narrative  . Not on file     Review of Systems  All other systems reviewed and are negative.      Objective:   Physical Exam  Constitutional: She appears well-developed and well-nourished. No distress.  Cardiovascular: Normal rate, regular rhythm and normal heart sounds.  No murmur heard. Pulmonary/Chest: Effort normal and breath sounds normal. No respiratory distress. She has no wheezes. She has no rales.  Musculoskeletal: She exhibits no edema.       Right knee: She exhibits decreased range of motion and swelling. She exhibits no LCL laxity, normal meniscus and no MCL laxity. Tenderness found. Medial joint line and lateral joint line tenderness noted. No MCL and no LCL tenderness noted.  Skin: She is not diaphoretic.  Vitals reviewed.         Assessment & Plan:  Acute pain of right knee  I suspect osteoarthritis.  I have recommended a short period of meloxicam 15 mg a day for 1 week.  We discussed about potential risk of liver however I believe a short round of anti-inflammatory should be safe for this patient.  I want her to stay off the knee as much as possible and ice the knee for 10 to 15 minutes twice a day.  If no better in 1 week, she can return for a cortisone injection.  Return sooner if pain is worsening.  If pain is better in 1 week, discontinue meloxicam

## 2017-09-25 ENCOUNTER — Emergency Department (HOSPITAL_COMMUNITY): Payer: Medicare Other

## 2017-09-25 ENCOUNTER — Encounter (HOSPITAL_COMMUNITY): Payer: Self-pay | Admitting: *Deleted

## 2017-09-25 ENCOUNTER — Emergency Department (HOSPITAL_COMMUNITY)
Admission: EM | Admit: 2017-09-25 | Discharge: 2017-09-25 | Disposition: A | Discharge status: Home / Self Care | Payer: Medicare Other | Attending: Emergency Medicine | Admitting: Emergency Medicine

## 2017-09-25 ENCOUNTER — Ambulatory Visit (HOSPITAL_COMMUNITY)
Admission: EM | Admit: 2017-09-25 | Discharge: 2017-09-25 | Disposition: A | Discharge status: Home / Self Care | Payer: Medicare Other | Source: Home / Self Care

## 2017-09-25 ENCOUNTER — Other Ambulatory Visit: Payer: Self-pay

## 2017-09-25 DIAGNOSIS — Y929 Unspecified place or not applicable: Secondary | ICD-10-CM | POA: Insufficient documentation

## 2017-09-25 DIAGNOSIS — I10 Essential (primary) hypertension: Secondary | ICD-10-CM | POA: Insufficient documentation

## 2017-09-25 DIAGNOSIS — Z79899 Other long term (current) drug therapy: Secondary | ICD-10-CM | POA: Diagnosis not present

## 2017-09-25 DIAGNOSIS — W19XXXA Unspecified fall, initial encounter: Secondary | ICD-10-CM

## 2017-09-25 DIAGNOSIS — Y9301 Activity, walking, marching and hiking: Secondary | ICD-10-CM | POA: Diagnosis not present

## 2017-09-25 DIAGNOSIS — S6991XA Unspecified injury of right wrist, hand and finger(s), initial encounter: Secondary | ICD-10-CM | POA: Diagnosis not present

## 2017-09-25 DIAGNOSIS — S8001XA Contusion of right knee, initial encounter: Secondary | ICD-10-CM | POA: Diagnosis not present

## 2017-09-25 DIAGNOSIS — R51 Headache: Secondary | ICD-10-CM | POA: Insufficient documentation

## 2017-09-25 DIAGNOSIS — S199XXA Unspecified injury of neck, initial encounter: Secondary | ICD-10-CM | POA: Diagnosis not present

## 2017-09-25 DIAGNOSIS — J45909 Unspecified asthma, uncomplicated: Secondary | ICD-10-CM | POA: Insufficient documentation

## 2017-09-25 DIAGNOSIS — M79644 Pain in right finger(s): Secondary | ICD-10-CM | POA: Diagnosis not present

## 2017-09-25 DIAGNOSIS — S0990XA Unspecified injury of head, initial encounter: Secondary | ICD-10-CM | POA: Diagnosis not present

## 2017-09-25 DIAGNOSIS — S63694A Other sprain of right ring finger, initial encounter: Secondary | ICD-10-CM | POA: Diagnosis not present

## 2017-09-25 DIAGNOSIS — Y92009 Unspecified place in unspecified non-institutional (private) residence as the place of occurrence of the external cause: Secondary | ICD-10-CM

## 2017-09-25 DIAGNOSIS — Y999 Unspecified external cause status: Secondary | ICD-10-CM | POA: Diagnosis not present

## 2017-09-25 DIAGNOSIS — S0993XA Unspecified injury of face, initial encounter: Secondary | ICD-10-CM | POA: Diagnosis not present

## 2017-09-25 DIAGNOSIS — S0093XA Contusion of unspecified part of head, initial encounter: Secondary | ICD-10-CM | POA: Diagnosis not present

## 2017-09-25 DIAGNOSIS — M545 Low back pain: Secondary | ICD-10-CM | POA: Insufficient documentation

## 2017-09-25 DIAGNOSIS — S63614A Unspecified sprain of right ring finger, initial encounter: Secondary | ICD-10-CM | POA: Diagnosis not present

## 2017-09-25 DIAGNOSIS — W108XXA Fall (on) (from) other stairs and steps, initial encounter: Secondary | ICD-10-CM | POA: Insufficient documentation

## 2017-09-25 DIAGNOSIS — M25561 Pain in right knee: Secondary | ICD-10-CM | POA: Diagnosis not present

## 2017-09-25 DIAGNOSIS — S0083XA Contusion of other part of head, initial encounter: Secondary | ICD-10-CM | POA: Diagnosis not present

## 2017-09-25 NOTE — ED Provider Notes (Signed)
Patient placed in Quick Look pathway, seen and evaluated   Chief Complaint: Fall  HPI:   72 year old female presents with a fall. She states she fell off the steps and hit her face on concrete earlier today. She went to [facility] but was instructed to come to the ED. She has a hx of brain surgery so family is concerned about that. She reports R sided facial pain, headache, low back pain, R ring finger pain, and R knee pain. She denies LOC or neck pain  ROS: +fall  Physical Exam:   Gen: No distress  Neuro: Awake and Alert  Skin: Warm    Focused Exam: Head: Bruising and mild swelling over R orbital region    Neck: No tenderness    Back: Low back tenderness    Right hand: Mild bruising over right ring finger    Right knee: Mild erythema over anterior knee   Initiation of care has begun. The patient has been counseled on the process, plan, and necessity for staying for the completion/evaluation, and the remainder of the medical screening examination    Recardo Evangelist, PA-C 09/25/17 2057    Margette Fast, MD 09/26/17 [phone]

## 2017-09-25 NOTE — ED Notes (Signed)
Call from [facility].  Pt was sent here from [facility] for CT.  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  Pt had a fall and [facility] sent her here for further workup including CT head

## 2017-09-25 NOTE — ED Notes (Signed)
Pt family speaks with me as they are concerned that pt is having increasing pain.  She reports back pain and pain in right hand.  Pt has some bruising noted next to right eye also.  Pt is concerned that she is becoming more sore and that she has not been seen by a doctor and has not had CT>  CT called, they state that they will get her asap, also spoke with PA in triage who will evaluate pt and determine if additional xrays are needed.  Pt placed on recliner in podA

## 2017-09-25 NOTE — ED Provider Notes (Signed)
[facility] EMERGENCY DEPARTMENT Provider Note   CSN: [id number] Arrival date & time: 09/25/17  1713     History   Chief Complaint Chief Complaint  Patient presents with  . Fall    HPI Veronica Flores is a 72 y.o. female.  HPI   72 year old female presenting for evaluation of a fall.  Patient report earlier today she stepped off a step, tripped, fell, and struck her face against concrete.  She denies any loss of consciousness but did complain of headache.  She has history of brain surgery in the past.  Aside from headache, she complains of right-sided facial pain, pain in her low back, right ring finger and right knee.  No neck pain.  Denies any precipitating symptoms prior to the fall.  Denies any chest pain, shortness of breath, abdominal pain, focal numbness or weakness.  Denies any dysuria.  She is not on any blood thinner medication.  Patient went to urgent care center today for evaluation but was recommended to come to the ER for further evaluation.  Past Medical History:  Diagnosis Date  . Allergy   . Asthma   . Autoimmune hepatitis ([place])   . Fatigue   . Hypertension   . Hypertrophic cardiomyopathy ([place])   . Migraine, ophthalmoplegic   . Osteopenia     Patient Active Problem List   Diagnosis Date Noted  . Hypertension 07/06/2010  . Seasonal allergies 07/06/2010  . Fatigue 07/06/2010  . Asthma 07/06/2010  . Autoimmune hepatitis ([place]) 07/06/2010  . 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 Meningioma ([place]) 07/06/2010  . S/P cholecystectomy 07/06/2010  . Osteopenia 07/06/2010  . 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 ANKLE SURGERY    . BRAIN SURGERY    . CHOLECYSTECTOMY    . IR RADIOLOGIST EVAL & MGMT  08/26/2016  . IR VERTEBROPLASTY LUMBAR BX INC UNI/BIL INC/INJECT/IMAGING  07/30/2016  . WRIST SURGERY       OB History   None      Home Medications    Prior to Admission  medications   Medication Sig Start Date End Date Taking? Authorizing Provider  azaTHIOprine (IMURAN) 50 MG tablet Take 100 mg by mouth daily. 2 pills once a day    [provider]  Calcium Carbonate-Vitamin D (CALTRATE 600+D) 600-400 MG-UNIT per chew tablet Chew 1 tablet by mouth 2 (two) times daily.      [provider]  cholecalciferol (VITAMIN D) 1000 UNITS tablet Take 1,000 Units by mouth daily.    [provider]  docusate sodium (COLACE) 100 MG capsule Take 100 mg by mouth daily.    [provider]  escitalopram (LEXAPRO) 10 MG tablet Take 1 tablet (10 mg total) by mouth daily. Patient not taking: Reported on 09/24/2017 05/06/17   Susy Frizzle, MD  Fluticasone-Salmeterol (ADVAIR DISKUS) 100-50 MCG/DOSE AEPB USE 1 INHALATION BY MOUTH INTO THE LUNGS EVERY 12 HOURS 12/01/16   Susy Frizzle, MD  ipratropium (ATROVENT) 0.06 % nasal spray USE ONE SPRAY NASALLY THREE TIMES DAILY 07/27/17   Susy Frizzle, MD  levocetirizine (XYZAL) 5 MG tablet Take 1 tablet (5 mg total) by mouth every evening. 05/15/17   Susy Frizzle, MD  meloxicam (MOBIC) 15 MG tablet TAKE 1 TABLET(15 MG) BY MOUTH DAILY 09/24/17   Susy Frizzle, MD  mometasone (ELOCON) 0.1 % cream Apply 1 application topically daily. Patient taking differently: Apply 1 application topically daily  as needed (skin irritations).  10/29/15   Susy Frizzle, MD  montelukast (SINGULAIR) 10 MG tablet TAKE 1 TABLET BY MOUTH AT BEDTIME 09/16/16   Susy Frizzle, MD  pravastatin (PRAVACHOL) 40 MG tablet TAKE 1 BY MOUTH DAILY 12/01/16   Susy Frizzle, MD  verapamil (CALAN-SR) 240 MG CR tablet TAKE 1 TABLET BY MOUTH DAILY 08/26/17   Susy Frizzle, MD    Family History No family history on file.  Social History Social History   Tobacco Use  . Smoking status: Never Smoker  . Smokeless tobacco: Never Used  Substance Use Topics  . Alcohol use: No  . Drug use: No     Allergies   Penicillins;  Allegra [fexofenadine hydrochloride]; Desloratadine; and Cephalexin   Review of Systems Review of Systems  All other systems reviewed and are negative.    Physical Exam Updated Vital Signs BP (!) 150/75   Pulse 72   Temp 98.6 F (37 C)   Resp 18   Ht 5\' 7"  (1.702 m)   Wt 80.3 kg (177 lb)   SpO2 100%   BMI 27.72 kg/m   Physical Exam  Constitutional: She is oriented to person, place, and time. She appears well-developed and well-nourished. No distress.  Elderly female sitting the chair in no acute discomfort.  HENT:  Mild ecchymosis noted to the right orbital rim with tenderness to palpation but no crepitus.  No hemotympanum, no septal hematoma, no malocclusion, no midface tenderness.  Eyes: Conjunctivae are normal.  Neck: Normal range of motion. Neck supple.  No significant midline spine tenderness  Cardiovascular: Normal rate and regular rhythm.  Pulmonary/Chest: Effort normal and breath sounds normal.  Abdominal: Soft. Bowel sounds are normal. She exhibits no distension. There is no tenderness.  Musculoskeletal: She exhibits tenderness (Right hand: Tenderness to ring finger without bruising or deformity.  Normal flexion and extension.  Right knee: Tenderness to anterior knee with mild redness but normal flexion and extension and no deformity.  Tenderness to lumbar paraspinal muscle.).  Neurological: She is alert and oriented to person, place, and time. She has normal strength. No cranial nerve deficit or sensory deficit. GCS eye subscore is 4. GCS verbal subscore is 5. GCS motor subscore is 6.  Skin: No rash noted.  Psychiatric: She has a normal mood and affect.  Nursing note and vitals reviewed.    ED Treatments / Results  Labs (all labs ordered are listed, but only abnormal results are displayed) Labs Reviewed - No data to display  EKG None  Radiology Dg Lumbar Spine Complete  Result Date: 09/25/2017 CLINICAL DATA:  Back pain after fall. EXAM: LUMBAR SPINE -  COMPLETE 4+ VIEW COMPARISON:  Lumbar spine x-rays dated July 21, 2016. FINDINGS: Five lumbar type vertebral bodies. No acute fracture or subluxation. Chronic severe L1 compression deformity status post kyphoplasty. Remaining vertebral body heights are preserved. Alignment is normal. Mild disc height loss at L2-L3 and moderate disc height loss at L4-L5, unchanged. The sacroiliac joints are intact. Aortic atherosclerosis. IMPRESSION: 1.  No acute osseous abnormality. 2. Prior L1 kyphoplasty. 3. Moderate degenerative disc disease at L4-L5, unchanged. Electronically Signed   By: Titus Dubin M.D.   On: 09/25/2017 20:15   Ct Head Wo Contrast  Result Date: 09/25/2017 CLINICAL DATA:  Fall. EXAM: CT HEAD WITHOUT CONTRAST CT MAXILLOFACIAL WITHOUT CONTRAST CT CERVICAL SPINE WITHOUT CONTRAST TECHNIQUE: Multidetector CT imaging of the head, cervical spine, and maxillofacial structures were performed using the standard protocol without intravenous  contrast. Multiplanar CT image reconstructions of the cervical spine and maxillofacial structures were also generated. COMPARISON:  MR brain dated August 18, 2013. CT neck dated Sep 09, 2006. FINDINGS: CT HEAD FINDINGS Brain: No evidence of acute infarction, hemorrhage, hydrocephalus, or extra-axial collection. Grossly unchanged skull base meningioma involving the right cavernous sinus and right posterior orbit. Vascular: Atherosclerotic vascular calcification of the carotid siphons. No hyperdense vessel. Skull: Postsurgical changes along the right lateral skull with right sphenoid bone defect. No fracture or focal lesion. Other: None. CT MAXILLOFACIAL FINDINGS Osseous: No fracture or mandibular dislocation. No destructive process. Postsurgical changes along the right lateral orbital wall. Orbits: No traumatic or inflammatory finding. Grossly unchanged soft tissue mass involving the right posterior orbit, consistent with meningioma. Sinuses: Clear. Soft tissues: None. CT CERVICAL  SPINE FINDINGS Alignment: Normal. Skull base and vertebrae: No acute fracture. No primary bone lesion or focal pathologic process. Soft tissues and spinal canal: No prevertebral fluid or swelling. No visible canal hematoma. Disc levels: Mild disc height loss at C5-C6. Mild uncovertebral hypertrophy at from C3-C4 through C5-C6. Upper chest: Negative. Other: None. IMPRESSION: 1.  No acute intracranial abnormality. 2.  No acute facial fracture. 3.  No acute cervical spine fracture. 4. Grossly unchanged skull base meningioma involving the right cavernous sinus and posterior orbit. Electronically Signed   By: Titus Dubin M.D.   On: 09/25/2017 20:10   Ct Cervical Spine Wo Contrast  Result Date: 09/25/2017 CLINICAL DATA:  Fall. EXAM: CT HEAD WITHOUT CONTRAST CT MAXILLOFACIAL WITHOUT CONTRAST CT CERVICAL SPINE WITHOUT CONTRAST TECHNIQUE: Multidetector CT imaging of the head, cervical spine, and maxillofacial structures were performed using the standard protocol without intravenous contrast. Multiplanar CT image reconstructions of the cervical spine and maxillofacial structures were also generated. COMPARISON:  MR brain dated August 18, 2013. CT neck dated Sep 09, 2006. FINDINGS: CT HEAD FINDINGS Brain: No evidence of acute infarction, hemorrhage, hydrocephalus, or extra-axial collection. Grossly unchanged skull base meningioma involving the right cavernous sinus and right posterior orbit. Vascular: Atherosclerotic vascular calcification of the carotid siphons. No hyperdense vessel. Skull: Postsurgical changes along the right lateral skull with right sphenoid bone defect. No fracture or focal lesion. Other: None. CT MAXILLOFACIAL FINDINGS Osseous: No fracture or mandibular dislocation. No destructive process. Postsurgical changes along the right lateral orbital wall. Orbits: No traumatic or inflammatory finding. Grossly unchanged soft tissue mass involving the right posterior orbit, consistent with meningioma. Sinuses:  Clear. Soft tissues: None. CT CERVICAL SPINE FINDINGS Alignment: Normal. Skull base and vertebrae: No acute fracture. No primary bone lesion or focal pathologic process. Soft tissues and spinal canal: No prevertebral fluid or swelling. No visible canal hematoma. Disc levels: Mild disc height loss at C5-C6. Mild uncovertebral hypertrophy at from C3-C4 through C5-C6. Upper chest: Negative. Other: None. IMPRESSION: 1.  No acute intracranial abnormality. 2.  No acute facial fracture. 3.  No acute cervical spine fracture. 4. Grossly unchanged skull base meningioma involving the right cavernous sinus and posterior orbit. Electronically Signed   By: Titus Dubin M.D.   On: 09/25/2017 20:10   Dg Knee Complete 4 Views Right  Result Date: 09/25/2017 CLINICAL DATA:  Right knee pain after fall. EXAM: RIGHT KNEE - COMPLETE 4+ VIEW COMPARISON:  None. FINDINGS: No acute fracture or dislocation. No joint effusion. Small tricompartmental osteophytes. Osteopenia. Soft tissues are unremarkable. IMPRESSION: 1.  No acute osseous abnormality. Electronically Signed   By: Titus Dubin M.D.   On: 09/25/2017 20:18   Dg Finger Ring Right  Result Date: 09/25/2017 CLINICAL DATA:  Ring finger pain after fall. EXAM: RIGHT RING FINGER 2+V COMPARISON:  Right hand middle finger x-rays dated August 07, 2017. FINDINGS: No acute fracture or dislocation. 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 Osteopenia. Soft tissues are unremarkable. IMPRESSION: Negative. Electronically Signed   By: Titus Dubin M.D.   On: 09/25/2017 20:17   Ct Maxillofacial Wo Contrast  Result Date: 09/25/2017 CLINICAL DATA:  Fall. EXAM: CT HEAD WITHOUT CONTRAST CT MAXILLOFACIAL WITHOUT CONTRAST CT CERVICAL SPINE WITHOUT CONTRAST TECHNIQUE: Multidetector CT imaging of the head, cervical spine, and maxillofacial structures were performed using the standard protocol without intravenous contrast. Multiplanar CT image reconstructions of the cervical  spine and maxillofacial structures were also generated. COMPARISON:  MR brain dated August 18, 2013. CT neck dated Sep 09, 2006. FINDINGS: CT HEAD FINDINGS Brain: No evidence of acute infarction, hemorrhage, hydrocephalus, or extra-axial collection. Grossly unchanged skull base meningioma involving the right cavernous sinus and right posterior orbit. Vascular: Atherosclerotic vascular calcification of the carotid siphons. No hyperdense vessel. Skull: Postsurgical changes along the right lateral skull with right sphenoid bone defect. No fracture or focal lesion. Other: None. CT MAXILLOFACIAL FINDINGS Osseous: No fracture or mandibular dislocation. No destructive process. Postsurgical changes along the right lateral orbital wall. Orbits: No traumatic or inflammatory finding. Grossly unchanged soft tissue mass involving the right posterior orbit, consistent with meningioma. Sinuses: Clear. Soft tissues: None. CT CERVICAL SPINE FINDINGS Alignment: Normal. Skull base and vertebrae: No acute fracture. No primary bone lesion or focal pathologic process. Soft tissues and spinal canal: No prevertebral fluid or swelling. No visible canal hematoma. Disc levels: Mild disc height loss at C5-C6. Mild uncovertebral hypertrophy at from C3-C4 through C5-C6. Upper chest: Negative. Other: None. IMPRESSION: 1.  No acute intracranial abnormality. 2.  No acute facial fracture. 3.  No acute cervical spine fracture. 4. Grossly unchanged skull base meningioma involving the right cavernous sinus and posterior orbit. Electronically Signed   By: Titus Dubin M.D.   On: 09/25/2017 20:10    Procedures Procedures (including critical care time)  Medications Ordered in ED Medications - No data to display   Initial Impression / Assessment and Plan / ED Course  I have reviewed the triage vital signs and the nursing notes.  Pertinent labs & imaging results that were available during my care of the patient were reviewed by me and  considered in my medical decision making (see chart for details).     BP (!) 150/75   Pulse 72   Temp 98.6 F (37 C)   Resp 18   Ht 5\' 7"  (1.702 m)   Wt 80.3 kg (177 lb)   SpO2 100%   BMI 27.72 kg/m    Final Clinical Impressions(s) / ED Diagnoses   Final diagnoses:  Contusion of face, initial encounter  Fall in home, initial encounter  Contusion of right knee, initial encounter  Other sprain of right ring finger, initial encounter    ED Discharge Orders    None     9:51 PM Patient had a mechanical fall earlier today.  She had bruising to her right orbital range, as well as pain to her lower back, right ring finger, right knee.  CT scan of the head, maxillofacial, cervical spine are without acute finding.  X-ray of L-spine, right ring finger and right knee are normal.  I recommend rice therapy and outpatient follow-up.  Return precautions discussed.     Domenic Moras, PA-C 09/25/17  2214    Tegeler, Gwenyth Allegra, MD 09/26/17 [phone]

## 2017-09-25 NOTE — Discharge Instructions (Addendum)
You have been evaluated for a fall.  Fortunately no evidence of any broken bone from the fall based in the CT scans and Xrays performed in the ER.  Please take your pain medication as needed for pain.  Ice affected area.  Follow up with your doctor for further care, return if you have any concerns.

## 2017-09-25 NOTE — ED Notes (Signed)
ED Provider at bedside. 

## 2017-09-25 NOTE — ED Notes (Signed)
Called Dr. Laverta Baltimore in regard to pt.  V.O for CT Head wo contrast

## 2017-09-25 NOTE — ED Notes (Signed)
Pt states she tripped and fell and hit the R side of her temple on concrete. Denies LOC. 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 Spoke with Dr. Meda Coffee, pt needs to be evaluated in the ER due to imaging capabilities here. Pt agreeable to plan. Ambulated with family out of UC, sent to ER.

## 2017-09-25 NOTE — ED Notes (Signed)
The [facility]   She is c/o rt  Ring finger and rtg knee pain also. She has bone missikng on her rt skull from previous brain surgery

## 2017-09-25 NOTE — ED Triage Notes (Signed)
The pt is here for a fall she came out her steps and fell striking her rt head on the concrete  No loc

## 2017-09-28 ENCOUNTER — Encounter: Payer: Self-pay | Admitting: Family Medicine

## 2017-09-29 ENCOUNTER — Encounter: Payer: Self-pay | Admitting: Family Medicine

## 2017-09-29 ENCOUNTER — Ambulatory Visit (INDEPENDENT_AMBULATORY_CARE_PROVIDER_SITE_OTHER): Payer: Medicare Other | Admitting: Family Medicine

## 2017-09-29 VITALS — BP 118/60 | HR 78 | Temp 98.2°F | Resp 18 | Ht 67.5 in | Wt 180.0 lb

## 2017-09-29 DIAGNOSIS — W57XXXA Bitten or stung by nonvenomous insect and other nonvenomous arthropods, initial encounter: Secondary | ICD-10-CM

## 2017-09-29 DIAGNOSIS — S70362A Insect bite (nonvenomous), left thigh, initial encounter: Secondary | ICD-10-CM

## 2017-09-29 DIAGNOSIS — L03116 Cellulitis of left lower limb: Secondary | ICD-10-CM

## 2017-09-29 MED ORDER — DOXYCYCLINE HYCLATE 100 MG PO TABS: 100.0000 mg | ORAL_TABLET | Freq: Two times a day (BID) | ORAL | 0 refills | Status: DC

## 2017-09-29 NOTE — Progress Notes (Signed)
Subjective:    Patient ID: Veronica Flores, female    DOB: February 15, 1946, 72 y.o.   MRN: [id number]  HPI Patient was bitten by some type of insect late last week on the lateral upper aspect of her left thigh.  Over the weekend, the area became extremely erythematous warm and painful.  There is now a circular patch of red hot indurated tender skin that is approximately 5 to 6 cm in diameter surrounding the central bite mark.  There is no central clearing to suggest Lyme disease but rather secondary cellulitis. Past Medical History:  Diagnosis Date  . Allergy   . Asthma   . Autoimmune hepatitis ([place])   . Fatigue   . Hypertension   . Hypertrophic cardiomyopathy ([place])   . Meningioma ([place])   . Migraine, ophthalmoplegic   . Osteopenia    Past Surgical History:  Procedure Laterality Date  . ANKLE SURGERY    . BRAIN SURGERY    . CHOLECYSTECTOMY    . IR RADIOLOGIST EVAL & MGMT  08/26/2016  . IR VERTEBROPLASTY LUMBAR BX INC UNI/BIL INC/INJECT/IMAGING  07/30/2016  . WRIST SURGERY     Current Outpatient Medications on File Prior to Visit  Medication Sig Dispense Refill  . azaTHIOprine (IMURAN) 50 MG tablet Take 100 mg by mouth daily. 2 pills once a day    . Calcium Carbonate-Vitamin D (CALTRATE 600+D) 600-400 MG-UNIT per chew tablet Chew 1 tablet by mouth 2 (two) times daily.      . cholecalciferol (VITAMIN D) 1000 UNITS tablet Take 1,000 Units by mouth daily.    Marland Kitchen docusate sodium (COLACE) 100 MG capsule Take 100 mg by mouth daily.    . Fluticasone-Salmeterol (ADVAIR DISKUS) 100-50 MCG/DOSE AEPB USE 1 INHALATION BY MOUTH INTO THE LUNGS EVERY 12 HOURS 180 each 3  . ipratropium (ATROVENT) 0.06 % nasal spray USE ONE SPRAY NASALLY THREE TIMES DAILY 45 mL 3  . levocetirizine (XYZAL) 5 MG tablet Take 1 tablet (5 mg total) by mouth every evening. 90 tablet 3  . meloxicam (MOBIC) 15 MG tablet TAKE 1 TABLET(15 MG) BY MOUTH DAILY 90 tablet 2  . mometasone (ELOCON) 0.1 % cream Apply 1 application topically  daily. (Patient taking differently: Apply 1 application topically daily as needed (skin irritations). ) 45 g 0  . montelukast (SINGULAIR) 10 MG tablet TAKE 1 TABLET BY MOUTH AT BEDTIME 90 tablet 3  . pravastatin (PRAVACHOL) 40 MG tablet TAKE 1 BY MOUTH DAILY 90 tablet 3  . verapamil (CALAN-SR) 240 MG CR tablet TAKE 1 TABLET BY MOUTH DAILY 90 tablet 0   No current facility-administered medications on file prior to visit.    Allergies  Allergen Reactions  . Penicillins Anaphylaxis  . Allegra [Fexofenadine Hydrochloride]     palpitations  . Desloratadine     Palpitations   . Cephalexin Hives and Rash   Social History   Socioeconomic History  . Marital status: Married    Spouse name: Not on file  . Number of children: Not on file  . Years of education: Not on file  . Highest education level: Not on file  Occupational History  . Not on file  Social Needs  . Financial resource strain: Not on file  . Food insecurity:    Worry: Not on file    Inability: Not on file  . Transportation needs:    Medical: Not on file    Non-medical: Not on file  Tobacco Use  . Smoking status: Never Smoker  .  Smokeless tobacco: Never Used  Substance and Sexual Activity  . Alcohol use: No  . Drug use: No  . Sexual activity: Not on file  Lifestyle  . Physical activity:    Days per week: Not on file    Minutes per session: Not on file  . Stress: Not on file  Relationships  . Social connections:    Talks on phone: Not on file    Gets together: Not on file    Attends religious service: Not on file    Active member of club or organization: Not on file    Attends meetings of clubs or organizations: Not on file    Relationship status: Not on file  . Intimate partner violence:    Fear of current or ex partner: Not on file    Emotionally abused: Not on file    Physically abused: Not on file    Forced sexual activity: Not on file  Other Topics Concern  . Not on file  Social History Narrative  .  Not on file     Review of Systems  All other systems reviewed and are negative.      Objective:   Physical Exam  Constitutional: She appears well-developed and well-nourished. No distress.  Cardiovascular: Normal rate, regular rhythm and normal heart sounds.  No murmur heard. Pulmonary/Chest: Effort normal and breath sounds normal. No respiratory distress. She has no wheezes. She has no rales.  Musculoskeletal: She exhibits no edema.       Right knee: She exhibits no MCL laxity.       Legs: Skin: Skin is warm. Rash noted. She is not diaphoretic. There is erythema.  Vitals reviewed.         Assessment & Plan:  Insect bite of left thigh, initial encounter  Cellulitis of left lower extremity

## 2017-10-09 DIAGNOSIS — Z1231 Encounter for screening mammogram for malignant neoplasm of breast: Secondary | ICD-10-CM | POA: Diagnosis not present

## 2017-10-09 LAB — HM MAMMOGRAPHY

## 2017-10-27 DIAGNOSIS — K754 Autoimmune hepatitis: Secondary | ICD-10-CM | POA: Diagnosis not present

## 2017-10-30 ENCOUNTER — Encounter: Payer: Self-pay | Admitting: *Deleted

## 2017-11-06 DIAGNOSIS — K754 Autoimmune hepatitis: Secondary | ICD-10-CM | POA: Diagnosis not present

## 2017-11-17 ENCOUNTER — Other Ambulatory Visit: Payer: Self-pay | Admitting: Family Medicine

## 2017-11-24 DIAGNOSIS — H5340 Unspecified visual field defects: Secondary | ICD-10-CM | POA: Diagnosis not present

## 2017-11-24 DIAGNOSIS — H2513 Age-related nuclear cataract, bilateral: Secondary | ICD-10-CM | POA: Diagnosis not present

## 2018-01-14 ENCOUNTER — Encounter: Payer: Self-pay | Admitting: Family Medicine

## 2018-01-14 ENCOUNTER — Ambulatory Visit (INDEPENDENT_AMBULATORY_CARE_PROVIDER_SITE_OTHER): Payer: Medicare Other | Admitting: Family Medicine

## 2018-01-14 VITALS — BP 156/70 | HR 64 | Temp 98.0°F | Resp 18 | Ht 67.5 in | Wt 179.0 lb

## 2018-01-14 DIAGNOSIS — Z23 Encounter for immunization: Secondary | ICD-10-CM | POA: Diagnosis not present

## 2018-01-14 DIAGNOSIS — E7800 Pure hypercholesterolemia, unspecified: Secondary | ICD-10-CM

## 2018-01-14 MED ORDER — ALPRAZOLAM 0.25 MG PO TABS: 0.5000 mg | ORAL_TABLET | Freq: Three times a day (TID) | ORAL | 0 refills | Status: DC | PRN

## 2018-01-14 MED ORDER — LEVOCETIRIZINE DIHYDROCHLORIDE 5 MG PO TABS: 5.0000 mg | ORAL_TABLET | Freq: Every evening | ORAL | 11 refills | Status: DC
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
## 2018-01-14 NOTE — Addendum Note (Signed)
Addended by: Shary Decamp B on: 01/14/2018 09:10 AM   Modules accepted: Orders

## 2018-01-15 LAB — CBC WITH DIFFERENTIAL/PLATELET
BASOS ABS: 21 {cells}/uL (ref 0–200)
Basophils Relative: 0.5 %
EOS ABS: 21 {cells}/uL (ref 15–500)
EOS PCT: 0.5 %
HEMATOCRIT: 46.4 % — AB (ref 35.0–45.0)
HEMOGLOBIN: 15.3 g/dL (ref 11.7–15.5)
Lymphs Abs: 1100 cells/uL (ref 850–3900)
MCH: 29.2 pg (ref 27.0–33.0)
MCHC: 33 g/dL (ref 32.0–36.0)
MCV: 88.5 fL (ref 80.0–100.0)
MPV: 9.5 fL (ref 7.5–12.5)
Monocytes Relative: 6.7 %
NEUTROS ABS: 2776 {cells}/uL (ref 1500–7800)
Neutrophils Relative %: 66.1 %
Platelets: 332 10*3/uL (ref 140–400)
RBC: 5.24 10*6/uL — ABNORMAL HIGH (ref 3.80–5.10)
RDW: 14.5 % (ref 11.0–15.0)
Total Lymphocyte: 26.2 %
WBC mixed population: 281 cells/uL (ref 200–950)
WBC: 4.2 10*3/uL (ref 3.8–10.8)

## 2018-01-15 LAB — COMPLETE METABOLIC PANEL WITH GFR
AG Ratio: 1.8 (calc) (ref 1.0–2.5)
ALT: 12 U/L (ref 6–29)
AST: 19 U/L (ref 10–35)
Albumin: 4.3 g/dL (ref 3.6–5.1)
Alkaline phosphatase (APISO): 49 U/L (ref 33–130)
BUN: 13 mg/dL (ref 7–25)
CALCIUM: 9.4 mg/dL (ref 8.6–10.4)
CO2: 21 mmol/L (ref 20–32)
CREATININE: 0.82 mg/dL (ref 0.60–0.93)
Chloride: 110 mmol/L (ref 98–110)
GFR, EST AFRICAN AMERICAN: 83 mL/min/{1.73_m2} (ref >=60)
GFR, EST NON AFRICAN AMERICAN: 72 mL/min/{1.73_m2} (ref >=60)
Globulin: 2.4 g/dL (calc) (ref 1.9–3.7)
Glucose, Bld: 78 mg/dL (ref 65–99)
Potassium: 4.5 mmol/L (ref 3.5–5.3)
Sodium: 140 mmol/L (ref 135–146)
TOTAL PROTEIN: 6.7 g/dL (ref 6.1–8.1)
Total Bilirubin: 0.8 mg/dL (ref 0.2–1.2)

## 2018-01-15 LAB — LIPID PANEL
CHOL/HDL RATIO: 3.2 (calc) (ref <5.0)
Cholesterol: 111 mg/dL (ref <200)
HDL: 35 mg/dL — AB (ref >50)
LDL CHOLESTEROL (CALC): 55 mg/dL
NON-HDL CHOLESTEROL (CALC): 76 mg/dL (ref <130)
TRIGLYCERIDES: 119 mg/dL (ref <150)

## 2018-02-26 DIAGNOSIS — D320 Benign neoplasm of cerebral meninges: Secondary | ICD-10-CM | POA: Diagnosis not present

## 2018-03-01 DIAGNOSIS — Z86011 Personal history of benign neoplasm of the brain: Secondary | ICD-10-CM | POA: Diagnosis not present

## 2018-03-01 DIAGNOSIS — G43109 Migraine with aura, not intractable, without status migrainosus: Secondary | ICD-10-CM | POA: Diagnosis not present

## 2018-03-01 DIAGNOSIS — Z48811 Encounter for surgical aftercare following surgery on the nervous system: Secondary | ICD-10-CM | POA: Diagnosis not present

## 2018-03-01 DIAGNOSIS — D320 Benign neoplasm of cerebral meninges: Secondary | ICD-10-CM | POA: Diagnosis not present

## 2018-03-01 DIAGNOSIS — Z9889 Other specified postprocedural states: Secondary | ICD-10-CM | POA: Diagnosis not present

## 2018-03-04 ENCOUNTER — Encounter: Payer: Self-pay | Admitting: Family Medicine

## 2018-05-11 ENCOUNTER — Other Ambulatory Visit: Payer: Self-pay | Admitting: Family Medicine

## 2018-05-27 ENCOUNTER — Encounter: Payer: Self-pay | Admitting: Family Medicine

## 2018-07-15 ENCOUNTER — Ambulatory Visit: Payer: Medicare Other | Admitting: Family Medicine

## 2018-07-22 ENCOUNTER — Other Ambulatory Visit: Payer: Self-pay

## 2018-07-22 ENCOUNTER — Ambulatory Visit (INDEPENDENT_AMBULATORY_CARE_PROVIDER_SITE_OTHER): Payer: Medicare Other | Admitting: Family Medicine

## 2018-07-22 ENCOUNTER — Ambulatory Visit: Payer: Medicare Other | Admitting: Family Medicine

## 2018-07-22 ENCOUNTER — Encounter: Payer: Self-pay | Admitting: Family Medicine

## 2018-07-22 VITALS — BP 128/64 | HR 60 | Temp 97.4°F | Resp 16 | Ht 67.5 in | Wt 183.0 lb

## 2018-07-22 DIAGNOSIS — E7800 Pure hypercholesterolemia, unspecified: Secondary | ICD-10-CM | POA: Diagnosis not present

## 2018-07-22 DIAGNOSIS — K754 Autoimmune hepatitis: Secondary | ICD-10-CM

## 2018-07-22 DIAGNOSIS — I421 Obstructive hypertrophic cardiomyopathy: Secondary | ICD-10-CM

## 2018-07-22 DIAGNOSIS — G459 Transient cerebral ischemic attack, unspecified: Secondary | ICD-10-CM

## 2018-07-22 MED ORDER — ROSUVASTATIN CALCIUM 20 MG PO TABS: 20.0000 mg | ORAL_TABLET | Freq: Every day | ORAL | 3 refills | Status: DC

## 2018-07-22 NOTE — Progress Notes (Signed)
Subjective:    Patient ID: Veronica Flores, female    DOB: Jun 27, 1945, 73 y.o.   MRN: [id number]   07/13/17 From the standpoint of her depression, she feels much better.  She is only taking 5 mg of Lexapro every day however she is sleeping better and she feels calmer.  She is hesitant to make any changes in the medication although she feels that it may be a placebo effect.  She is taking an aspirin every day with no history of heart disease.  I recommended that she discontinue that.  She does have a history of hypertrophic cardiomyopathy but no history of coronary artery disease.  She denies any chest pain shortness of breath syncope or near syncope.  She denies any tachycardia or irregular heartbeats.  She denies any myalgias or right upper quadrant pain.  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  She is due today to recheck a CMP and monitor her fasting lipid panel.  Regarding her osteopenia, she is scheduled for a bone density test later this week.  She is compliant with her calcium and her vitamin D.  At that time, my plan was: Check CMP and fasting lipid panel.  Goal LDL cholesterol is less than 130.  Monitor liver function test.  Patient's blood pressure is well controlled and her heart rate is well regulated and she is asymptomatic from a standpoint of her hypertrophic cardiomyopathy.  Recommended discontinuation of aspirin.  Recommended continuing Lexapro 5 mg a day although I am not sure that the medication is was causing her to feel better or if it could simply be the passage of time.  However the patient is comfortable on the medicine and she would like to continue it.  01/14/18 Patient is here today for a checkup.  She stopped taking Lexapro a few months ago because it made her feel "apathetic".  However she continues to have occasional anxiety.  1-2 times a week, she will feel overwhelmed with stress.  She will feel her "nerves act  up".  This includes feeling generally anxious, panicked, or having trouble sleeping.  She would like a medication she could take sparingly just as needed just to take the edge off but not something that she has to take every day the leaves her feeling overmedicated.  She denies any chest pain shortness of breath or dyspnea on exertion.  She is due today for a booster on Pneumovax 23 along with her flu shot.  At that time, my plan was: I will gladly recheck a CMP and a fasting lipid panel today.  She received her flu shot along with a booster of Pneumovax 23.  I believe the patient is dealing more with situational anxiety and does not require daily medication.  Therefore I will give her Xanax 0.25 mg tablets.  She can take 1 to 2 tablets sparingly as needed for breakthrough anxiety.  We discussed the risk of habituation and dependency but the patient expects only to use this 1 or 2 times a month.  Her blood pressure is elevated today.  Normally is well controlled.  She seems extremely anxious this morning and she is under stress caring for her husband.  I recommended that we recheck that in the future but I would not change her medication at the present time unless persistently greater than 140/90  07/22/18 Patient believes she may have had a TIA this morning.  She has a longstanding  history of ocular migraines.  She describes ocular migraines as white and black crystals in her field of vision.  They occur usually with a pressure-like sensation in her head and typically lasts less than 10 minutes.  It involves her entire field of vision.  She has had these off and on for years.  This morning she had an ocular migraine.  However this was followed by word finding aphasia that also lasted for approximately 10 minutes.  She knew the words she was trying to say however she could not get the words to come out.  She was talking with her daughter over the telephone and she states that she was slurring her words.  Her  speech was not garbled or incomprehensible however it was slurred.  She denies any right facial weakness.  She denies any right-sided hemiparesis.  Cranial nerves II through XII are grossly intact today.  Muscle strength is 5/5 equal and symmetric in the upper and lower extremities.  There is no evidence of any neurologic deficit other than her fixed right pupil which is a result of her remote surgery to remove a meningioma.  There is no evidence of papilledema on funduscopic examination.  She denies any headache.  She is completely back to normal.  Symptoms lasted a total of 5 to 10 minutes and resolve spontaneously.  She is not taking an aspirin.  She denies any chest pain shortness of breath or dyspnea on exertion.  She denies any palpitations or irregular heartbeats. Past Medical History:  Diagnosis Date  . Allergy   . Asthma   . Autoimmune hepatitis ([place])   . Fatigue   . Hypertension   . Hypertrophic cardiomyopathy ([place])   . Meningioma ([facility])    s/p craniotomy and removal from right medial sphenoid wing extending into cavernous sinus  . Migraine, ophthalmoplegic   . Osteopenia    Past Surgical History:  Procedure Laterality Date  . ANKLE SURGERY    . BRAIN SURGERY    . CHOLECYSTECTOMY    . IR RADIOLOGIST EVAL & MGMT  08/26/2016  . IR VERTEBROPLASTY LUMBAR BX INC UNI/BIL INC/INJECT/IMAGING  07/30/2016  . WRIST SURGERY     Current Outpatient Medications on File Prior to Visit  Medication Sig Dispense Refill  . ALPRAZolam (XANAX) 0.25 MG tablet Take 2 tablets (0.5 mg total) by mouth 3 (three) times daily as needed for anxiety. 20 tablet 0  . azaTHIOprine (IMURAN) 50 MG tablet Take 100 mg by mouth daily. 2 pills once a day    . Calcium Carbonate-Vitamin D (CALTRATE 600+D) 600-400 MG-UNIT per chew tablet Chew 1 tablet by mouth 2 (two) times daily.      Marland Kitchen docusate sodium (COLACE) 100 MG capsule Take 100 mg by mouth daily.    . Fluticasone-Salmeterol (ADVAIR DISKUS) 100-50 MCG/DOSE AEPB USE 1  INHALATION BY MOUTH INTO THE LUNGS EVERY 12 HOURS 180 each 3  . ipratropium (ATROVENT) 0.06 % nasal spray USE ONE SPRAY NASALLY THREE TIMES DAILY 45 mL 3  . levocetirizine (XYZAL) 5 MG tablet Take 1 tablet (5 mg total) by mouth every evening. 90 tablet 3  . levocetirizine (XYZAL) 5 MG tablet Take 1 tablet (5 mg total) by mouth every evening. 30 tablet 11  . meloxicam (MOBIC) 15 MG tablet TAKE 1 TABLET(15 MG) BY MOUTH DAILY 90 tablet 2  . mometasone (ELOCON) 0.1 % cream Apply 1 application topically daily. (Patient taking differently: Apply 1 application topically daily as needed (skin irritations). ) 45 g  0  . montelukast (SINGULAIR) 10 MG tablet TAKE 1 TABLET BY MOUTH AT BEDTIME 90 tablet 3  . pravastatin (PRAVACHOL) 40 MG tablet TAKE 1 TABLET BY MOUTH DAILY 90 tablet 3  . verapamil (CALAN-SR) 240 MG CR tablet TAKE 1 TABLET BY MOUTH DAILY 90 tablet 3   No current facility-administered medications on file prior to visit.    Allergies  Allergen Reactions  . Penicillins Anaphylaxis  . Allegra [Fexofenadine Hydrochloride]     palpitations  . Desloratadine     Palpitations   . Cephalexin Hives and Rash   Social History   Socioeconomic History  . Marital status: Married    Spouse name: Not on file  . Number of children: Not on file  . Years of education: Not on file  . Highest education level: Not on file  Occupational History  . Not on file  Social Needs  . Financial resource strain: Not on file  . Food insecurity:    Worry: Not on file    Inability: Not on file  . Transportation needs:    Medical: Not on file    Non-medical: Not on file  Tobacco Use  . Smoking status: Never Smoker  . Smokeless tobacco: Never Used  Substance and Sexual Activity  . Alcohol use: No  . Drug use: No  . Sexual activity: Not on file  Lifestyle  . Physical activity:    Days per week: Not on file    Minutes per session: Not on file  . Stress: Not on file  Relationships  . Social connections:     Talks on phone: Not on file    Gets together: Not on file    Attends religious service: Not on file    Active member of club or organization: Not on file    Attends meetings of clubs or organizations: Not on file    Relationship status: Not on file  . Intimate partner violence:    Fear of current or ex partner: Not on file    Emotionally abused: Not on file    Physically abused: Not on file    Forced sexual activity: Not on file  Other Topics Concern  . Not on file  Social History Narrative  . Not on file     Review of Systems  All other systems reviewed and are negative.      Objective:   Physical Exam  Constitutional: She is oriented to person, place, and time. She appears well-developed and well-nourished. No distress.  HENT:  Head: Normocephalic and atraumatic.  Nose: Nose normal.  Mouth/Throat: Oropharynx is clear and moist. No oropharyngeal exudate.  Eyes: Conjunctivae and EOM are normal.  Neck: Neck supple.  Cardiovascular: Normal rate, regular rhythm and normal heart sounds.  No murmur heard. Pulmonary/Chest: Effort normal and breath sounds normal. No respiratory distress. She has no wheezes. She has no rales.  Abdominal: Soft. Bowel sounds are normal. She exhibits no distension. There is no abdominal tenderness. There is no rebound and no guarding.  Musculoskeletal:        General: No edema.  Lymphadenopathy:    She has no cervical adenopathy.  Neurological: She is alert and oriented to person, place, and time. She has normal reflexes. She displays normal reflexes. No cranial nerve deficit. She exhibits normal muscle tone. Coordination normal.  Skin: She is not diaphoretic.  Psychiatric: She has a normal mood and affect. Her behavior is normal. Judgment and thought content normal.  Vitals reviewed.  Assessment & Plan:  Pure hypercholesterolemia - Plan: CBC with Differential/Platelet, COMPLETE METABOLIC PANEL WITH GFR, Lipid panel  Autoimmune  hepatitis ([place]) - Plan: CBC with Differential/Platelet, COMPLETE METABOLIC PANEL WITH GFR, Lipid panel  HOCM (hypertrophic obstructive cardiomyopathy) ([place]) - Plan: CBC with Differential/Platelet, COMPLETE METABOLIC PANEL WITH GFR, Lipid panel  TIA (transient ischemic attack) - Plan: US Carotid Duplex Bilateral  Differential diagnosis includes a complicated atypical migraine versus a TIA.  I will treat the patient as though this was a TIA.  I recommended she start aspirin 325 mg daily for the first month and then switch to 81 mg a day thereafter.  I will obtain carotid Dopplers to evaluate for any evidence of internal carotid artery stenosis however I do not appreciate any bruit today on examination.  I will check a CBC, CMP, fasting lipid panel and try to drive her LDL cholesterol below 70.  I recommended discontinuing pravastatin and switching to Crestor 20 mg a day.  Await the results of the studies.  Than 25 minutes was spent today performing history and physical examination and discussing treatment strategies

## 2018-07-23 LAB — COMPLETE METABOLIC PANEL WITH GFR
AG Ratio: 1.7 (calc) (ref 1.0–2.5)
ALT: 14 U/L (ref 6–29)
AST: 21 U/L (ref 10–35)
Albumin: 4.4 g/dL (ref 3.6–5.1)
Alkaline phosphatase (APISO): 48 U/L (ref 37–153)
BUN: 16 mg/dL (ref 7–25)
CO2: 19 mmol/L — ABNORMAL LOW (ref 20–32)
Calcium: 9.9 mg/dL (ref 8.6–10.4)
Chloride: 111 mmol/L — ABNORMAL HIGH (ref 98–110)
Creat: 0.82 mg/dL (ref 0.60–0.93)
GFR, Est African American: 83 mL/min/{1.73_m2} (ref >=60)
GFR, Est Non African American: 71 mL/min/{1.73_m2} (ref >=60)
Globulin: 2.6 g/dL (calc) (ref 1.9–3.7)
Glucose, Bld: 89 mg/dL (ref 65–99)
Potassium: 4.5 mmol/L (ref 3.5–5.3)
Sodium: 142 mmol/L (ref 135–146)
Total Bilirubin: 0.6 mg/dL (ref 0.2–1.2)
Total Protein: 7 g/dL (ref 6.1–8.1)

## 2018-07-23 LAB — CBC WITH DIFFERENTIAL/PLATELET
Absolute Monocytes: 302 cells/uL (ref 200–950)
Basophils Absolute: 19 cells/uL (ref 0–200)
Basophils Relative: 0.4 %
Eosinophils Absolute: 0 cells/uL — ABNORMAL LOW (ref 15–500)
Eosinophils Relative: 0 %
HCT: 44.3 % (ref 35.0–45.0)
Hemoglobin: 15 g/dL (ref 11.7–15.5)
Lymphs Abs: 1330 cells/uL (ref 850–3900)
MCH: 29.4 pg (ref 27.0–33.0)
MCHC: 33.9 g/dL (ref 32.0–36.0)
MCV: 86.7 fL (ref 80.0–100.0)
MPV: 10.1 fL (ref 7.5–12.5)
Monocytes Relative: 6.3 %
Neutro Abs: 3149 cells/uL (ref 1500–7800)
Neutrophils Relative %: 65.6 %
Platelets: 314 10*3/uL (ref 140–400)
RBC: 5.11 10*6/uL — ABNORMAL HIGH (ref 3.80–5.10)
RDW: 14 % (ref 11.0–15.0)
Total Lymphocyte: 27.7 %
WBC: 4.8 10*3/uL (ref 3.8–10.8)

## 2018-07-23 LAB — LIPID PANEL
Cholesterol: 123 mg/dL (ref <200)
HDL: 36 mg/dL — ABNORMAL LOW (ref >=50)
LDL Cholesterol (Calc): 68 mg/dL (calc)
Non-HDL Cholesterol (Calc): 87 mg/dL (calc) (ref <130)
Total CHOL/HDL Ratio: 3.4 (calc) (ref <5.0)
Triglycerides: 107 mg/dL (ref <150)

## 2018-08-02 ENCOUNTER — Other Ambulatory Visit: Payer: Self-pay | Admitting: Family Medicine

## 2018-08-02 DIAGNOSIS — G459 Transient cerebral ischemic attack, unspecified: Secondary | ICD-10-CM

## 2018-08-27 ENCOUNTER — Other Ambulatory Visit: Payer: Self-pay | Admitting: Family Medicine

## 2018-09-21 ENCOUNTER — Other Ambulatory Visit: Payer: Medicare Other

## 2018-09-29 ENCOUNTER — Ambulatory Visit
Admission: RE | Admit: 2018-09-29 | Discharge: 2018-09-29 | Disposition: A | Discharge status: Home / Self Care | Payer: Medicare Other | Source: Ambulatory Visit | Attending: Family Medicine | Admitting: Family Medicine

## 2018-09-29 DIAGNOSIS — I6523 Occlusion and stenosis of bilateral carotid arteries: Secondary | ICD-10-CM | POA: Diagnosis not present

## 2018-09-29 DIAGNOSIS — G459 Transient cerebral ischemic attack, unspecified: Secondary | ICD-10-CM

## 2018-11-17 DIAGNOSIS — K754 Autoimmune hepatitis: Secondary | ICD-10-CM | POA: Diagnosis not present

## 2018-11-19 ENCOUNTER — Other Ambulatory Visit: Payer: Self-pay

## 2018-11-23 ENCOUNTER — Encounter: Payer: Self-pay | Admitting: Family Medicine

## 2018-11-23 DIAGNOSIS — Z803 Family history of malignant neoplasm of breast: Secondary | ICD-10-CM | POA: Diagnosis not present

## 2018-11-23 DIAGNOSIS — Z1231 Encounter for screening mammogram for malignant neoplasm of breast: Secondary | ICD-10-CM | POA: Diagnosis not present

## 2018-11-26 DIAGNOSIS — K754 Autoimmune hepatitis: Secondary | ICD-10-CM | POA: Diagnosis not present

## 2018-11-30 ENCOUNTER — Other Ambulatory Visit: Payer: Self-pay | Admitting: Family Medicine

## 2018-12-01 DIAGNOSIS — H5202 Hypermetropia, left eye: Secondary | ICD-10-CM | POA: Diagnosis not present

## 2018-12-01 DIAGNOSIS — H2513 Age-related nuclear cataract, bilateral: Secondary | ICD-10-CM | POA: Diagnosis not present

## 2018-12-01 DIAGNOSIS — H5340 Unspecified visual field defects: Secondary | ICD-10-CM | POA: Diagnosis not present

## 2018-12-01 DIAGNOSIS — D320 Benign neoplasm of cerebral meninges: Secondary | ICD-10-CM | POA: Diagnosis not present

## 2019-01-11 ENCOUNTER — Other Ambulatory Visit: Payer: Self-pay

## 2019-01-11 ENCOUNTER — Ambulatory Visit (INDEPENDENT_AMBULATORY_CARE_PROVIDER_SITE_OTHER): Payer: Medicare Other | Admitting: Family Medicine

## 2019-01-11 DIAGNOSIS — Z23 Encounter for immunization: Secondary | ICD-10-CM

## 2019-05-19 ENCOUNTER — Ambulatory Visit: Payer: Medicare Other

## 2019-05-23 ENCOUNTER — Ambulatory Visit: Payer: Medicare Other

## 2019-05-28 ENCOUNTER — Ambulatory Visit: Payer: Medicare Other

## 2019-05-28 ENCOUNTER — Ambulatory Visit: Payer: Medicare Other | Attending: Internal Medicine

## 2019-05-28 DIAGNOSIS — Z23 Encounter for immunization: Secondary | ICD-10-CM | POA: Insufficient documentation

## 2019-05-28 NOTE — Progress Notes (Signed)
   Covid-19 Vaccination Clinic  Name:  Veronica Flores    MRN: [id number] DOB: 1945/11/03  05/28/2019  [name] was observed post Covid-19 immunization for 75 minutes. She began feeling dizzy and flushed at 1738. BP=174/68, HR=72. She had a slight redness of her skin. Patient given 25 mg Benadryl. Patient monitored for an additional 30 minutes. At 1746, BP=170/86, HR=67. Patient's symptoms resolved. Patient was escorted home by her daughter.     She was provided with Vaccine Information Sheet and instruction to access the V-Safe system.   [name] was instructed to call 911 with any severe reactions post vaccine: Marland Kitchen Difficulty breathing  . Swelling of your face and throat  . A fast heartbeat  . A bad rash all over your body  . Dizziness and weakness    Immunizations Administered    Name Date Dose VIS Date Route   Pfizer COVID-19 Vaccine 05/28/2019  5:00 PM 0.3 mL 04/01/2019 Intramuscular   Manufacturer: [place]   Lot: [id number]   [place]: [id number]

## 2019-05-30 ENCOUNTER — Ambulatory Visit: Payer: Medicare Other

## 2019-05-31 ENCOUNTER — Other Ambulatory Visit: Payer: Self-pay | Admitting: *Deleted

## 2019-05-31 MED ORDER — ROSUVASTATIN CALCIUM 20 MG PO TABS: 20.0000 mg | ORAL_TABLET | Freq: Every day | ORAL | 3 refills | Status: DC

## 2019-06-16 ENCOUNTER — Encounter: Payer: Self-pay | Admitting: Family Medicine

## 2019-06-21 ENCOUNTER — Ambulatory Visit: Payer: Medicare Other | Attending: Internal Medicine

## 2019-06-21 DIAGNOSIS — Z23 Encounter for immunization: Secondary | ICD-10-CM | POA: Insufficient documentation

## 2019-06-21 NOTE — Progress Notes (Signed)
   Covid-19 Vaccination Clinic  Name:  Veronica Flores    MRN: [id number] DOB: 11-28-1945  06/21/2019  Veronica Flores was observed post Covid-19 immunization for 30 minutes based on pre-vaccination screening without incident. She was provided with Vaccine Information Sheet and instruction to access the V-Safe system.   Veronica Flores was instructed to call 911 with any severe reactions post vaccine: Marland Kitchen Difficulty breathing  . Swelling of face and throat  . A fast heartbeat  . A bad rash all over body  . Dizziness and weakness   Immunizations Administered    Name Date Dose VIS Date Route   Pfizer COVID-19 Vaccine 06/21/2019  3:35 PM 0.3 mL 04/01/2019 Intramuscular   Manufacturer: [place]   Lot: [id number]   [place]: [id number]

## 2019-07-20 DIAGNOSIS — M8589 Other specified disorders of bone density and structure, multiple sites: Secondary | ICD-10-CM | POA: Diagnosis not present

## 2019-07-28 ENCOUNTER — Encounter: Payer: Self-pay | Admitting: Family Medicine

## 2019-08-15 ENCOUNTER — Other Ambulatory Visit: Payer: Self-pay | Admitting: Family Medicine

## 2019-11-22 ENCOUNTER — Other Ambulatory Visit: Payer: Self-pay

## 2019-11-22 MED ORDER — VERAPAMIL HCL ER 240 MG PO TBCR: 240.0000 mg | EXTENDED_RELEASE_TABLET | Freq: Every day | ORAL | 3 refills | Status: DC

## 2019-11-28 ENCOUNTER — Telehealth: Payer: Self-pay | Admitting: Nurse Practitioner

## 2019-11-28 NOTE — Telephone Encounter (Signed)
On call pager telephone note

## 2019-12-05 ENCOUNTER — Other Ambulatory Visit: Payer: Self-pay

## 2019-12-05 ENCOUNTER — Ambulatory Visit (INDEPENDENT_AMBULATORY_CARE_PROVIDER_SITE_OTHER): Payer: Medicare Other | Admitting: Nurse Practitioner

## 2019-12-05 VITALS — BP 120/72 | HR 58 | Temp 97.6°F | Resp 18 | Wt 183.2 lb

## 2019-12-05 DIAGNOSIS — N3001 Acute cystitis with hematuria: Secondary | ICD-10-CM | POA: Diagnosis not present

## 2019-12-05 DIAGNOSIS — E7800 Pure hypercholesterolemia, unspecified: Secondary | ICD-10-CM | POA: Diagnosis not present

## 2019-12-05 DIAGNOSIS — R399 Unspecified symptoms and signs involving the genitourinary system: Secondary | ICD-10-CM | POA: Diagnosis not present

## 2019-12-05 LAB — URINALYSIS, ROUTINE W REFLEX MICROSCOPIC
Bacteria, UA: NONE SEEN /HPF
Bilirubin Urine: NEGATIVE
Glucose, UA: NEGATIVE
Hyaline Cast: NONE SEEN /LPF
Ketones, ur: NEGATIVE
Nitrite: NEGATIVE
Protein, ur: NEGATIVE
Specific Gravity, Urine: 1.01 (ref 1.001–1.03)
pH: 7 (ref 5.0–8.0)

## 2019-12-05 LAB — MICROSCOPIC MESSAGE

## 2019-12-05 MED ORDER — NITROFURANTOIN MONOHYD MACRO 100 MG PO CAPS: 100.0000 mg | ORAL_CAPSULE | Freq: Two times a day (BID) | ORAL | 0 refills | Status: DC

## 2019-12-05 NOTE — Progress Notes (Signed)
Established Patient Office Visit  Subjective:  Patient ID: Veronica Flores, female    DOB: May 18, 1945  Age: 74 y.o. MRN: [id number]  CC:  Chief Complaint  Patient presents with  . Cystitis    abd pressure, finished 2 anbiotics and still is no better, pt describes symptoms uncomfortable, low back pain, started in June 2021, azo was taken also    HPI Veronica Flores is a 74 year old presenting to the clinic with sxs of pelvic pressure, urinary urgency that started 2 days after she completed Macrobid for a recent UTI, that started yesterday about 1 AM. She has a h/o autoimmune hepatitis and medicated with autoimmune suppressive medication Imuran that increases her risk of infection.   No CVA tenderness, fever, chills, other pain, sob, edema, hematuria, cp, ct, other gu/gi sxs.   No other txs tried.   Time spent discussing autoimmune disease, COVID third vaccination, pt compelled after education will plan to make her appt.   Time spent discussing good urinary hygiene, increasing H2o.   Pt inquired about her next follow up appt with her PCP: discussed per records due for f/u and lab appt in 2 months.   Past Medical History:  Diagnosis Date  . Allergy   . Asthma   . Autoimmune hepatitis ([place])   . Fatigue   . Hypertension   . Hypertrophic cardiomyopathy ([place])   . Meningioma ([facility])    s/p craniotomy and removal from right medial sphenoid wing extending into cavernous sinus  . Migraine, ophthalmoplegic   . Osteopenia     Past Surgical History:  Procedure Laterality Date  . ANKLE SURGERY    . BRAIN SURGERY    . CHOLECYSTECTOMY    . IR RADIOLOGIST EVAL & MGMT  08/26/2016  . IR VERTEBROPLASTY LUMBAR BX INC UNI/BIL INC/INJECT/IMAGING  07/30/2016  . WRIST SURGERY      No family history on file.  Social History   Socioeconomic History  . Marital status: Married    Spouse name: Not on file  . Number of children: Not on file  . Years of education: Not on file  . Highest education  level: Not on file  Occupational History  . Not on file  Tobacco Use  . Smoking status: Never Smoker  . Smokeless tobacco: Never Used  Substance and Sexual Activity  . Alcohol use: No  . Drug use: No  . Sexual activity: Not on file  Other Topics Concern  . Not on file  Social History Narrative  . Not on file   Social Determinants of Health   Financial Resource Strain:   . Difficulty of Paying Living Expenses:   Food Insecurity:   . Worried About Charity fundraiser in the Last Year:   . Arboriculturist in the Last Year:   Transportation Needs:   . Film/video editor (Medical):   Marland Kitchen Lack of Transportation (Non-Medical):   Physical Activity:   . Days of Exercise per Week:   . Minutes of Exercise per Session:   Stress:   . Feeling of Stress :   Social Connections:   . Frequency of Communication with Friends and Family:   . Frequency of Social Gatherings with Friends and Family:   . Attends Religious Services:   . Active Member of Clubs or Organizations:   . Attends Archivist Meetings:   Marland Kitchen Marital Status:   Intimate Partner Violence:   . Fear of Current or Ex-Partner:   .  Emotionally Abused:   Marland Kitchen Physically Abused:   . Sexually Abused:     Outpatient Medications Prior to Visit  Medication Sig Dispense Refill  . ALPRAZolam (XANAX) 0.25 MG tablet Take 2 tablets (0.5 mg total) by mouth 3 (three) times daily as needed for anxiety. 20 tablet 0  . azaTHIOprine (IMURAN) 50 MG tablet Take 100 mg by mouth daily. 2 pills once a day    . Calcium Carbonate-Vitamin D (CALTRATE 600+D) 600-400 MG-UNIT per chew tablet Chew 1 tablet by mouth 2 (two) times daily.      Marland Kitchen docusate sodium (COLACE) 100 MG capsule Take 100 mg by mouth daily.    . Fluticasone-Salmeterol (ADVAIR DISKUS) 100-50 MCG/DOSE AEPB USE 1 INHALATION BY MOUTH INTO THE LUNGS EVERY 12 HOURS 180 each 3  . ipratropium (ATROVENT) 0.06 % nasal spray USE 1 SPRAY NASALLY THREE TIMES DAILY 45 mL 3  . levocetirizine  (XYZAL) 5 MG tablet Take 1 tablet (5 mg total) by mouth every evening. 30 tablet 11  . meloxicam (MOBIC) 15 MG tablet TAKE 1 TABLET(15 MG) BY MOUTH DAILY 90 tablet 2  . mometasone (ELOCON) 0.1 % cream Apply 1 application topically daily. (Patient taking differently: Apply 1 application topically daily as needed (skin irritations). ) 45 g 0  . montelukast (SINGULAIR) 10 MG tablet TAKE 1 TABLET BY MOUTH AT BEDTIME 90 tablet 3  . rosuvastatin (CRESTOR) 20 MG tablet Take 20 mg by mouth daily.    . verapamil (CALAN-SR) 240 MG CR tablet Take 1 tablet (240 mg total) by mouth daily. 90 tablet 3  . nitrofurantoin, macrocrystal-monohydrate, (MACROBID) 100 MG capsule Take 100 mg by mouth 2 (two) times daily.    . pravastatin (PRAVACHOL) 40 MG tablet TAKE 1 TABLET BY MOUTH DAILY 90 tablet 3  . rosuvastatin (CRESTOR) 20 MG tablet Take 1 tablet (20 mg total) by mouth daily. 90 tablet 3   No facility-administered medications prior to visit.    Allergies  Allergen Reactions  . Penicillins Anaphylaxis  . Allegra [Fexofenadine Hydrochloride]     palpitations  . Desloratadine     Palpitations   . Cephalexin Hives and Rash    ROS Review of Systems  All other systems reviewed and are negative.     Objective:    Physical Exam Vitals and nursing note reviewed.  Constitutional:      Appearance: Normal appearance.  HENT:     Head: Normocephalic.  Eyes:     Extraocular Movements: Extraocular movements intact.     Conjunctiva/sclera: Conjunctivae normal.     Pupils: Pupils are equal, round, and reactive to light.  Cardiovascular:     Rate and Rhythm: Normal rate.  Pulmonary:     Effort: Pulmonary effort is normal.  Abdominal:     Tenderness: There is no right CVA tenderness or left CVA tenderness.  Musculoskeletal:     Cervical back: Normal range of motion and neck supple.     Right lower leg: No edema.     Left lower leg: No edema.  Skin:    Coloration: Skin is not jaundiced or pale.    Neurological:     General: No focal deficit present.     Mental Status: She is alert and oriented to person, place, and time.  Psychiatric:        Mood and Affect: Mood normal.        Behavior: Behavior normal.        Thought Content: Thought content normal.  Judgment: Judgment normal.     BP 120/72 (BP Location: Left Arm, Patient Position: Sitting, Cuff Size: Normal)   Pulse (!) 58   Temp 97.6 F (36.4 C) (Temporal)   Resp 18   Wt 183 lb 3.2 oz (83.1 kg)   SpO2 97%   BMI 28.27 kg/m  Wt Readings from Last 3 Encounters:  12/05/19 183 lb 3.2 oz (83.1 kg)  07/22/18 183 lb (83 kg)  01/14/18 179 lb (81.2 kg)     Health Maintenance Due  Topic Date Due  . Hepatitis C Screening  Never done  . INFLUENZA VACCINE  11/20/2019    There are no preventive care reminders to display for this patient.  Lab Results  Component Value Date   TSH 1.25 05/29/2015   Lab Results  Component Value Date   WBC 4.8 07/22/2018   HGB 15.0 07/22/2018   HCT 44.3 07/22/2018   MCV 86.7 07/22/2018   PLT 314 07/22/2018   Lab Results  Component Value Date   NA 142 07/22/2018   K 4.5 07/22/2018   CO2 19 (L) 07/22/2018   GLUCOSE 89 07/22/2018   BUN 16 07/22/2018   CREATININE 0.82 07/22/2018   BILITOT 0.6 07/22/2018   ALKPHOS 53 12/01/2016   AST 21 07/22/2018   ALT 14 07/22/2018   PROT 7.0 07/22/2018   ALBUMIN 4.1 12/01/2016   CALCIUM 9.9 07/22/2018   ANIONGAP 9 07/30/2016   Lab Results  Component Value Date   CHOL 123 07/22/2018   Lab Results  Component Value Date   HDL 36 (L) 07/22/2018   Lab Results  Component Value Date   LDLCALC 68 07/22/2018   Lab Results  Component Value Date   TRIG 107 07/22/2018   Lab Results  Component Value Date   CHOLHDL 3.4 07/22/2018   Lab Results  Component Value Date   HGBA1C 5.4 05/27/2013      Assessment & Plan:   Problem List Items Addressed This Visit    None    Visit Diagnoses    UTI symptoms    -  Primary   Relevant  Orders   Urinalysis, Routine w reflex microscopic (Completed)   Acute cystitis with hematuria       Relevant Medications   nitrofurantoin, macrocrystal-monohydrate, (MACROBID) 100 MG capsule    with auto immune disorder, return of sxs and positive UA today, will send urine culture and restart Macrobid, pt will make apt after completing medication for UA. good urinary hygiene- cleanliness, front to back wiping, not holding urine, if incontinent cleaning immediately, increasing H2o.   Get third booster COVID vaccination as you are on immune suppressor medication  Due for labs with PCP apt in 2 months  Meds ordered this encounter  Medications  . nitrofurantoin, macrocrystal-monohydrate, (MACROBID) 100 MG capsule    Sig: Take 1 capsule (100 mg total) by mouth 2 (two) times daily.    Dispense:  14 capsule    Refill:  0    Follow-up: Return if symptoms worsen or fail to improve, for and once completed antibiotic nsg for repeat UA and in 2 months for lab and fu with pcp.    Annie Main, FNP

## 2019-12-07 DIAGNOSIS — H52203 Unspecified astigmatism, bilateral: Secondary | ICD-10-CM | POA: Diagnosis not present

## 2019-12-07 DIAGNOSIS — D496 Neoplasm of unspecified behavior of brain: Secondary | ICD-10-CM | POA: Diagnosis not present

## 2019-12-07 DIAGNOSIS — H2513 Age-related nuclear cataract, bilateral: Secondary | ICD-10-CM | POA: Diagnosis not present

## 2019-12-15 ENCOUNTER — Encounter: Payer: Self-pay | Admitting: Nurse Practitioner

## 2019-12-15 ENCOUNTER — Telehealth: Payer: Self-pay | Admitting: *Deleted

## 2019-12-15 ENCOUNTER — Other Ambulatory Visit: Payer: Self-pay | Admitting: Nurse Practitioner

## 2019-12-15 ENCOUNTER — Other Ambulatory Visit: Payer: Self-pay

## 2019-12-15 ENCOUNTER — Other Ambulatory Visit: Payer: Medicare Other

## 2019-12-15 DIAGNOSIS — Z87440 Personal history of urinary (tract) infections: Secondary | ICD-10-CM

## 2019-12-15 DIAGNOSIS — N3001 Acute cystitis with hematuria: Secondary | ICD-10-CM

## 2019-12-15 DIAGNOSIS — R300 Dysuria: Secondary | ICD-10-CM

## 2019-12-15 DIAGNOSIS — N952 Postmenopausal atrophic vaginitis: Secondary | ICD-10-CM

## 2019-12-15 LAB — URINALYSIS, ROUTINE W REFLEX MICROSCOPIC
Bilirubin Urine: NEGATIVE
Glucose, UA: NEGATIVE
Hyaline Cast: NONE SEEN /LPF
Ketones, ur: NEGATIVE
Nitrite: NEGATIVE
Protein, ur: NEGATIVE
Specific Gravity, Urine: 1.025 (ref 1.001–1.03)
pH: 5.5 (ref 5.0–8.0)

## 2019-12-15 LAB — MICROSCOPIC MESSAGE

## 2019-12-15 MED ORDER — SULFAMETHOXAZOLE-TRIMETHOPRIM 800-160 MG PO TABS: 1.0000 | ORAL_TABLET | Freq: Two times a day (BID) | ORAL | 0 refills | Status: DC

## 2019-12-15 MED ORDER — SULFAMETHOXAZOLE-TRIMETHOPRIM 800-160 MG PO TABS: 1.0000 | ORAL_TABLET | Freq: Two times a day (BID) | ORAL | 0 refills | Status: AC

## 2019-12-15 MED ORDER — PREMARIN 0.625 MG/GM VA CREA: TOPICAL_CREAM | VAGINAL | 12 refills | Status: DC

## 2019-12-15 NOTE — Telephone Encounter (Signed)
Call placed to patient and patient made aware.  

## 2019-12-15 NOTE — Telephone Encounter (Signed)
-----   Message from Annie Main, [place] sent at 12/15/2019 11:11 AM EDT ----- Please call pt to let her know urine she dropped off shows UTI  Please instruct/encourage target 2 to 3 liters of fluid intake daily, I have RX for Premarin intravaginal r/t recurrent UTI, suggest vaginal estrogen to hopefully reduce occurrence RX for antibiotic Sent urine for culture to hopefully get growth and return with sensitivity to different antibiotics.     Sent to [facility]

## 2019-12-16 ENCOUNTER — Other Ambulatory Visit: Payer: Self-pay | Admitting: Family Medicine

## 2019-12-16 ENCOUNTER — Telehealth: Payer: Self-pay

## 2019-12-16 DIAGNOSIS — N952 Postmenopausal atrophic vaginitis: Secondary | ICD-10-CM

## 2019-12-16 LAB — URINE CULTURE
MICRO NUMBER:: 10876131
Result:: NO GROWTH
SPECIMEN QUALITY:: ADEQUATE

## 2019-12-16 MED ORDER — PREMARIN 0.625 MG/GM VA CREA: TOPICAL_CREAM | VAGINAL | 12 refills | Status: DC

## 2019-12-16 NOTE — Telephone Encounter (Deleted)
Mrs. Rane had 2 precsription sent to wrong pharmacy yesterday, I've called 1 yesterday

## 2019-12-16 NOTE — Telephone Encounter (Signed)
Pt needed vag cream sent to correct pharmacy, prescription sent to correct pharmacy.

## 2019-12-19 NOTE — Telephone Encounter (Signed)
erroneous

## 2019-12-28 ENCOUNTER — Other Ambulatory Visit: Payer: Self-pay

## 2019-12-28 ENCOUNTER — Other Ambulatory Visit: Payer: Medicare Other

## 2019-12-28 DIAGNOSIS — K754 Autoimmune hepatitis: Secondary | ICD-10-CM | POA: Diagnosis not present

## 2019-12-28 DIAGNOSIS — N3001 Acute cystitis with hematuria: Secondary | ICD-10-CM | POA: Diagnosis not present

## 2019-12-28 DIAGNOSIS — Z1231 Encounter for screening mammogram for malignant neoplasm of breast: Secondary | ICD-10-CM | POA: Diagnosis not present

## 2019-12-29 LAB — URINE CULTURE
MICRO NUMBER:: 10923992
SPECIMEN QUALITY:: ADEQUATE

## 2020-01-03 DIAGNOSIS — Z23 Encounter for immunization: Secondary | ICD-10-CM | POA: Diagnosis not present

## 2020-01-04 DIAGNOSIS — K754 Autoimmune hepatitis: Secondary | ICD-10-CM | POA: Diagnosis not present

## 2020-01-04 DIAGNOSIS — Z86010 Personal history of colonic polyps: Secondary | ICD-10-CM | POA: Diagnosis not present

## 2020-02-03 DIAGNOSIS — K754 Autoimmune hepatitis: Secondary | ICD-10-CM | POA: Diagnosis not present

## 2020-02-06 ENCOUNTER — Other Ambulatory Visit: Payer: Self-pay

## 2020-02-06 ENCOUNTER — Ambulatory Visit (INDEPENDENT_AMBULATORY_CARE_PROVIDER_SITE_OTHER): Payer: Medicare Other | Admitting: Family Medicine

## 2020-02-06 VITALS — BP 156/76 | HR 62 | Temp 97.7°F | Ht 67.0 in | Wt 183.2 lb

## 2020-02-06 DIAGNOSIS — I421 Obstructive hypertrophic cardiomyopathy: Secondary | ICD-10-CM

## 2020-02-06 DIAGNOSIS — Z23 Encounter for immunization: Secondary | ICD-10-CM | POA: Diagnosis not present

## 2020-02-06 DIAGNOSIS — E7800 Pure hypercholesterolemia, unspecified: Secondary | ICD-10-CM

## 2020-02-06 DIAGNOSIS — I10 Essential (primary) hypertension: Secondary | ICD-10-CM

## 2020-02-06 DIAGNOSIS — K754 Autoimmune hepatitis: Secondary | ICD-10-CM

## 2020-02-06 NOTE — Patient Instructions (Signed)
° ° ° °  If you have lab work done today you will be contacted with your lab results within the next 2 weeks.  If you have not heard from us then please contact us. The fastest way to get your results is to register for My Chart. ° ° °IF you received an x-ray today, you will receive an invoice from [facility]. Please contact  Radiology at [phone] with questions or concerns regarding your invoice.  ° °IF you received labwork today, you will receive an invoice from [facility]. Please contact [facility] at [phone] with questions or concerns regarding your invoice.  ° °Our billing staff will not be able to assist you with questions regarding bills from these companies. ° °You will be contacted with the lab results as soon as they are available. The fastest way to get your results is to activate your My Chart account. Instructions are located on the last page of this paperwork. If you have not heard from us regarding the results in 2 weeks, please contact this office. °  ° ° ° °

## 2020-02-06 NOTE — Progress Notes (Signed)
Subjective:    Patient ID: Veronica Flores, female    DOB: 04-23-1945, 74 y.o.   MRN: [id number]   07/22/18 Patient believes she may have had a TIA this morning.  She has a longstanding history of ocular migraines.  She describes ocular migraines as white and black crystals in her field of vision.  They occur usually with a pressure-like sensation in her head and typically lasts less than 10 minutes.  It involves her entire field of vision.  She has had these off and on for years.  This morning she had an ocular migraine.  However this was followed by word finding aphasia that also lasted for approximately 10 minutes.  She knew the words she was trying to say however she could not get the words to come out.  She was talking with her daughter over the telephone and she states that she was slurring her words.  Her speech was not garbled or incomprehensible however it was slurred.  She denies any right facial weakness.  She denies any right-sided hemiparesis.  Cranial nerves II through XII are grossly intact today.  Muscle strength is 5/5 equal and symmetric in the upper and lower extremities.  There is no evidence of any neurologic deficit other than her fixed right pupil which is a result of her remote surgery to remove a meningioma.  There is no evidence of papilledema on funduscopic examination.  She denies any headache.  She is completely back to normal.  Symptoms lasted a total of 5 to 10 minutes and resolve spontaneously.  She is not taking an aspirin.  She denies any chest pain shortness of breath or dyspnea on exertion.  She denies any palpitations or irregular heartbeats.  At that time, my plan was: Differential diagnosis includes a complicated atypical migraine versus a TIA.  I will treat the patient as though this was a TIA.  I recommended she start aspirin 325 mg daily for the first month and then switch to 81 mg a day thereafter.  I will obtain carotid Dopplers to evaluate for any evidence of  internal carotid artery stenosis however I do not appreciate any bruit today on examination.  I will check a CBC, CMP, fasting lipid panel and try to drive her LDL cholesterol below 70.  I recommended discontinuing pravastatin and switching to Crestor 20 mg a day.  Await the results of the studies.  Than 25 minutes was spent today performing history and physical examination and discussing treatment strategies  02/06/20 Blood pressure today is elevated.  She denies any chest pain or shortness of breath or dyspnea on exertion.  She has had no further TIA like symptoms.  She does have occasional ocular migraines which she will see flashing lights or floaters or zigzagging lines in her field of vision.  These usually occur in her right eye.  They resolve after a few minutes.  She only gets a headache approximately every 2 to 3 months.  These occur sporadically.  She is still taking aspirin 81 mg daily.  She denies any bleeding or bruising.  Mammogram was performed earlier this spring and is up-to-date.  Bone density test was performed earlier this spring and showed osteopenia.  She is scheduled a colonoscopy for later this year.  She does not require a Pap smear.  Her immunizations are up-to-date.  Overall she seems to be doing well other than being under tremendous stress caring for her husband who has end-stage Parkinson's disease.  She  is not sleeping well.  He is apparently sundowning at night causing her to have more stress particularly in the evenings and not resting well Past Medical History:  Diagnosis Date   Allergy    Asthma    Autoimmune hepatitis ([place])    Fatigue    Hypertension    Hypertrophic cardiomyopathy ([place])    Meningioma ([place])    s/p craniotomy and removal from right medial sphenoid wing extending into cavernous sinus   Migraine, ophthalmoplegic    Osteopenia    Past Surgical History:  Procedure Laterality Date   ANKLE SURGERY     BRAIN SURGERY     CHOLECYSTECTOMY      IR RADIOLOGIST EVAL & MGMT  08/26/2016   IR VERTEBROPLASTY LUMBAR BX INC UNI/BIL INC/INJECT/IMAGING  07/30/2016   WRIST SURGERY     Current Outpatient Medications on File Prior to Visit  Medication Sig Dispense Refill   ALPRAZolam (XANAX) 0.25 MG tablet Take 2 tablets (0.5 mg total) by mouth 3 (three) times daily as needed for anxiety. 20 tablet 0   azaTHIOprine (IMURAN) 50 MG tablet Take 100 mg by mouth daily. 2 pills once a day     Calcium Carbonate-Vitamin D (CALTRATE 600+D) 600-400 MG-UNIT per chew tablet Chew 1 tablet by mouth 2 (two) times daily.       conjugated estrogens (PREMARIN) vaginal cream 0.5 g of cream intravaginally administered daily for 2 weeks, then reduce to twice weekly dose. 42.5 g 12   docusate sodium (COLACE) 100 MG capsule Take 100 mg by mouth daily.     Fluticasone-Salmeterol (ADVAIR DISKUS) 100-50 MCG/DOSE AEPB USE 1 INHALATION BY MOUTH INTO THE LUNGS EVERY 12 HOURS 180 each 3   ipratropium (ATROVENT) 0.06 % nasal spray USE 1 SPRAY NASALLY THREE TIMES DAILY 45 mL 3   levocetirizine (XYZAL) 5 MG tablet Take 1 tablet (5 mg total) by mouth every evening. 30 tablet 11   meloxicam (MOBIC) 15 MG tablet TAKE 1 TABLET(15 MG) BY MOUTH DAILY 90 tablet 2   mometasone (ELOCON) 0.1 % cream Apply 1 application topically daily. (Patient taking differently: Apply 1 application topically daily as needed (skin irritations). ) 45 g 0   montelukast (SINGULAIR) 10 MG tablet TAKE 1 TABLET BY MOUTH AT BEDTIME 90 tablet 3   nitrofurantoin, macrocrystal-monohydrate, (MACROBID) 100 MG capsule Take 1 capsule (100 mg total) by mouth 2 (two) times daily. 14 capsule 0   rosuvastatin (CRESTOR) 20 MG tablet Take 20 mg by mouth daily.     verapamil (CALAN-SR) 240 MG CR tablet Take 1 tablet (240 mg total) by mouth daily. 90 tablet 3   No current facility-administered medications on file prior to visit.   Allergies  Allergen Reactions   Penicillins Anaphylaxis   Allegra  [Fexofenadine Hydrochloride]     palpitations   Desloratadine     Palpitations    Cephalexin Hives and Rash   Social History   Socioeconomic History   Marital status: Married    Spouse name: Not on file   Number of children: Not on file   Years of education: Not on file   Highest education level: Not on file  Occupational History   Not on file  Tobacco Use   Smoking status: Never Smoker   Smokeless tobacco: Never Used  Substance and Sexual Activity   Alcohol use: No   Drug use: No   Sexual activity: Not on file  Other Topics Concern   Not on file  Social History Narrative  Not on file   Social Determinants of Health   Financial Resource Strain:    Difficulty of Paying Living Expenses: Not on file  Food Insecurity:    Worried About [place] in the Last Year: Not on file   Ran Out of Food in the Last Year: Not on file  Transportation Needs:    Lack of Transportation (Medical): Not on file   Lack of Transportation (Non-Medical): Not on file  Physical Activity:    Days of Exercise per Week: Not on file   Minutes of Exercise per Session: Not on file  Stress:    Feeling of Stress : Not on file  Social Connections:    Frequency of Communication with Friends and Family: Not on file   Frequency of Social Gatherings with Friends and Family: Not on file   Attends Religious Services: Not on file   Active Member of Clubs or Organizations: Not on file   Attends Archivist Meetings: Not on file   Marital Status: Not on file  Intimate Partner Violence:    Fear of Current or Ex-Partner: Not on file   Emotionally Abused: Not on file   Physically Abused: Not on file   Sexually Abused: Not on file     Review of Systems  All other systems reviewed and are negative.      Objective:   Physical Exam Vitals reviewed.  Constitutional:      General: She is not in acute distress.    Appearance: She is well-developed. She  is not diaphoretic.  HENT:     Head: Normocephalic and atraumatic.     Nose: Nose normal.     Mouth/Throat:     Pharynx: No oropharyngeal exudate.  Eyes:     Conjunctiva/sclera: Conjunctivae normal.  Cardiovascular:     Rate and Rhythm: Normal rate and regular rhythm.     Heart sounds: Normal heart sounds. No murmur heard.   Pulmonary:     Effort: Pulmonary effort is normal. No respiratory distress.     Breath sounds: Normal breath sounds. No wheezing or rales.  Abdominal:     General: Bowel sounds are normal. There is no distension.     Palpations: Abdomen is soft.     Tenderness: There is no abdominal tenderness. There is no guarding or rebound.  Musculoskeletal:     Cervical back: Neck supple.  Lymphadenopathy:     Cervical: No cervical adenopathy.  Neurological:     Mental Status: She is alert and oriented to person, place, and time.     Cranial Nerves: No cranial nerve deficit.     Motor: No abnormal muscle tone.     Coordination: Coordination normal.     Deep Tendon Reflexes: Reflexes are normal and symmetric.  Psychiatric:        Behavior: Behavior normal.        Thought Content: Thought content normal.        Judgment: Judgment normal.           Assessment & Plan:  Pure hypercholesterolemia - Plan: Lipid panel  Need for immunization against influenza - Plan: Flu Vaccine QUAD High Dose(Fluad)  Autoimmune hepatitis ([place])  HOCM (hypertrophic obstructive cardiomyopathy) ([place])  Primary hypertension  I do not like her blood pressure today.  I have recommended that she check her blood pressure daily for the next 7 to 10 days and report the values to [place].  If consistently greater than 140/90, I would recommend starting  the patient on Diovan 80 mg daily and uptitrating until we achieve a goal blood pressure less than 140/90 particular given her history of hypertrophic obstructive cardiomyopathy.  I will check a fasting lipid panel and given her history of a TIA I  would like to keep her LDL cholesterol less than 70.  Her carotid Dopplers last year were completely clear showing no significant carotid artery stenosis.  She received her flu shot today.  The remainder of her medical care is up-to-date.  Her CBC and CMP were recently checked at her gastroenterologist.

## 2020-02-07 LAB — LIPID PANEL
Cholesterol: 97 mg/dL (ref <200)
HDL: 38 mg/dL — ABNORMAL LOW (ref >=50)
LDL Cholesterol (Calc): 42 mg/dL (calc)
Non-HDL Cholesterol (Calc): 59 mg/dL (calc) (ref <130)
Total CHOL/HDL Ratio: 2.6 (calc) (ref <5.0)
Triglycerides: 90 mg/dL (ref <150)

## 2020-02-16 ENCOUNTER — Encounter: Payer: Self-pay | Admitting: Family Medicine

## 2020-02-17 ENCOUNTER — Other Ambulatory Visit: Payer: Self-pay | Admitting: Family Medicine

## 2020-02-17 MED ORDER — VALSARTAN 80 MG PO TABS: 80.0000 mg | ORAL_TABLET | Freq: Every day | ORAL | 3 refills | Status: DC

## 2020-02-22 ENCOUNTER — Telehealth: Payer: Self-pay | Admitting: Family Medicine

## 2020-02-22 NOTE — Progress Notes (Signed)
°  Chronic Care Management   Note  02/22/2020 Name: Veronica Flores MRN: [id number] DOB: [birth year]  Veronica Flores is a 74 y.o. year old female who is a primary care patient of Dennard Schaumann, Cammie Mcgee, MD. I reached out to Gibson Ramp by phone today in response to a referral sent by [name]'s PCP, Susy Frizzle, MD.   [name] was given information about Chronic Care Management services today including:  1. CCM service includes personalized support from designated clinical staff supervised by her physician, including individualized plan of care and coordination with other care providers 2. 24/7 contact phone numbers for assistance for urgent and routine care needs. 3. Service will only be billed when office clinical staff spend 20 minutes or more in a month to coordinate care. 4. Only one practitioner may furnish and bill the service in a calendar month. 5. The patient may stop CCM services at any time (effective at the end of the month) by phone call to the office staff.   Patient agreed to services and verbal consent obtained.   Follow up plan:   Carley Perdue [employer] Scheduler

## 2020-02-22 NOTE — Chronic Care Management (AMB) (Addendum)
Chronic Care Management Pharmacy  Name: Veronica Flores  MRN: [id number] DOB: 03-May-1945   Chief Complaint/ HPI  Gibson Ramp,  74 y.o. , female presents for their Initial CCM visit with the clinical pharmacist In office.  PCP : Susy Frizzle, MD  Their chronic conditions include: HTN, Asthma, Osteopenia.  Office Visits: 02/06/2020 ([place]) - Blood pressure today is elevated, she is dealing with stress over her husband's health which involves end-stage Parkinson's disease.  Given history of TIA recommended to keep her LDL < 70.  12/05/2019 [place] [facility]) - signs/symptoms of UTI.  Given Macrobid while urine culture is pending.   Consult Visit: none recent  Medications: Outpatient Encounter Medications as of 02/28/2020  Medication Sig   ALPRAZolam (XANAX) 0.25 MG tablet Take 2 tablets (0.5 mg total) by mouth 3 (three) times daily as needed for anxiety.   azaTHIOprine (IMURAN) 50 MG tablet Take 100 mg by mouth daily. 2 pills once a day   Calcium Carbonate-Vitamin D (CALTRATE 600+D) 600-400 MG-UNIT per chew tablet Chew 1 tablet by mouth 2 (two) times daily.     docusate sodium (COLACE) 100 MG capsule Take 100 mg by mouth daily.   Fluticasone-Salmeterol (ADVAIR DISKUS) 100-50 MCG/DOSE AEPB USE 1 INHALATION BY MOUTH INTO THE LUNGS EVERY 12 HOURS   ipratropium (ATROVENT) 0.06 % nasal spray USE 1 SPRAY NASALLY THREE TIMES DAILY   levocetirizine (XYZAL) 5 MG tablet Take 1 tablet (5 mg total) by mouth every evening.   meloxicam (MOBIC) 15 MG tablet TAKE 1 TABLET(15 MG) BY MOUTH DAILY   mometasone (ELOCON) 0.1 % cream Apply 1 application topically daily. (Patient taking differently: Apply 1 application topically daily as needed (skin irritations). )   montelukast (SINGULAIR) 10 MG tablet TAKE 1 TABLET BY MOUTH AT BEDTIME   rosuvastatin (CRESTOR) 20 MG tablet Take 20 mg by mouth daily.   valsartan (DIOVAN) 80 MG tablet Take 1 tablet (80 mg total) by mouth daily.   verapamil (CALAN-SR) 240 MG  CR tablet Take 1 tablet (240 mg total) by mouth daily.   conjugated estrogens (PREMARIN) vaginal cream 0.5 g of cream intravaginally administered daily for 2 weeks, then reduce to twice weekly dose.   nitrofurantoin, macrocrystal-monohydrate, (MACROBID) 100 MG capsule Take 1 capsule (100 mg total) by mouth 2 (two) times daily. (Patient not taking: Reported on 02/28/2020)   No facility-administered encounter medications on file as of 02/28/2020.     Current Diagnosis/Assessment:   [employer]: Low Risk    Difficulty of Paying Living Expenses: Not very hard    Goals Addressed             This Visit's Progress    Pharmacy Care Plan:       CARE PLAN ENTRY (see longitudinal plan of care for additional care plan information)  Current Barriers:  Chronic Disease Management support, education, and care coordination needs related to Hypertension, Asthma, and Osteopenia   Hypertension BP Readings from Last 3 Encounters:  02/06/20 (!) 156/76  12/05/19 120/72  07/22/18 128/64   Pharmacist Clinical Goal(s): Over the next 180 days, patient will work with PharmD and providers to achieve BP goal <140/90 Current regimen:  Verapamil 240mg  CR Valsartan 80mg  daily Interventions: Reviewed home blood pressure monitoring Recommended morning dosing of new medication valsartan 80mg  starting after colonoscopy Patient self care activities - Over the next 180 days, patient will: Check BP daily, document, and provide at future appointments Ensure daily salt intake < 2300 mg/day Begin valsartan 80  mg every morning, monitor BP and contact providers with any consistent readings > 140/90  Asthma Pharmacist Clinical Goal(s) Over the next 180 days, patient will work with PharmD and providers to optimize medication and minimize symptoms of asthma. Current regimen:  Advair Diskus 100-35mcg Singulair 10mg  Interventions: Reviewed proper inhaler use Counseled on rinsing mouth after  use Patient self care activities - Over the next 180 days, patient will: Continue to take meds as directed Contact providers with any new or worsening symptoms  Osteopenia Pharmacist Clinical Goal(s) Over the next 180 days, patient will work with PharmD and providers to optimize medication and minimize symptoms of osteopenia. Current regimen:  Calcium 600mg  + vitamin D3 Interventions:  Recommend weight-bearing and muscle strengthening exercises for building and maintaining bone density.  Patient self care activities - Over the next 180 days, patient will: Continue Calcium + Vitamin D3 as recommended. Initial goal documentation         Asthma    Eosinophil count:   Lab Results  Component Value Date/Time   EOSPCT 0.0 07/22/2018 09:31 AM  %                               Eos (Absolute):  Lab Results  Component Value Date/Time   EOSABS 0 (L) 07/22/2018 09:31 AM    Tobacco Status:  Social History   Tobacco Use  Smoking Status Never Smoker  Smokeless Tobacco Never Used    Patient has failed these meds in past: none noted Patient is currently controlled on the following medications:  Advair Diskus 100-56mcg Singulair 10mg  Using maintenance inhaler regularly? Yes Frequency of rescue inhaler use:  N/A  We discussed:   Asthma currently controlled, only using Advair once daily mostly, will occasionally have to use two doses per day Reports cost is affordable currently does not ever enter donut hole Counseled on rinsing mouth after use and proper inhaler usage. Denies SOB currently, no limitations on ADLs  Plan  Continue current medications  Hypertension   BP goal is:  <140/90  Office blood pressures are  BP Readings from Last 3 Encounters:  02/06/20 (!) 156/76  12/05/19 120/72  07/22/18 128/64   Patient checks BP at home daily Patient home BP readings are ranging: 130s/70s  Patient has failed these meds in the past: none noted Patient is currently  controlled on the following medications:  Verapamil 240mg  CR Valsartan 80mg  daily  We discussed  She has not started taking her valsartan yet, due to an upcoming colonoscopy and confusion on what time of day she was supposed to take it. Recommended AM dosing since she takes her Verapamil in the evening, she will hold until after her colonoscopy and begin taking this Friday. Counseled to monitor her blood pressure daily 1-2 hours after taking Valsartan in the am, record numbers and report back to [place] if BP is too low or > 140/90.  Plan  Continue current medications  Begin am valsartan Friday.     Osteopenia   Last DEXA Scan: 07/20/2019  T-Score femoral neck:-1.3   No results found for: VD25OH   Patient is not a candidate for pharmacologic treatment  Patient has failed these meds in past: none noted Patient is currently controlled on the following medications:  Calcium 600mg  + vitamin D3 We discussed:   Recommend weight-bearing and muscle strengthening exercises for building and maintaining bone density.  DEXA is up to date, repeat in two years  Plan  Continue current medications   Vaccines   Reviewed and discussed patient's vaccination history.    Immunization History  Administered Date(s) Administered   Fluad Quad(high Dose 65+) 01/11/2019, 02/06/2020   Hepatitis A 12/19/2003, 10/13/2006   Hepatitis B 12/19/2003, 10/13/2006   Influenza Whole 01/02/2010   Influenza, High Dose Seasonal PF 02/05/2017, 01/14/2018   Influenza,inj,Quad PF,6+ Mos 01/27/2013, 02/09/2014, 02/08/2015, 02/07/2016   PFIZER SARS-COV-2 Vaccination 05/28/2019, 06/21/2019   Pneumococcal Conjugate-13 05/27/2013   Pneumococcal Polysaccharide-23 01/20/2004, 09/02/2010, 01/14/2018   Td 04/22/2003   Tdap 03/18/2011    Plan  Recommended patient receive Shingrix vaccine in pharmacy  Medication Management   Miscellaneous medications:  Azathioprine 50mg  2 tablets daily Ipratropium 0.06% nasal  spray Meloxicam 15mg  prn Rosuvastatin 20mg  OTC's:  ASA 81mg  Docusate 100mg  [facility].  Patient reports using pill organizer jar method to organize medications and promote adherence. Patient denies missed doses of medication.   Beverly Milch, PharmD Clinical Pharmacist [place] [phone] I have collaborated with the care management provider regarding care management and care coordination activities outlined in this encounter and have reviewed this encounter including documentation in the note and care plan. I am certifying that I agree with the content of this note and encounter as supervising physician.

## 2020-02-27 ENCOUNTER — Telehealth: Payer: Self-pay | Admitting: Pharmacist

## 2020-02-27 NOTE — Progress Notes (Signed)
Chronic Care Management Pharmacy Assistant   Name: Veronica Flores  MRN: [id number] DOB: [birth year]  Reason for Encounter: Initial Questions   PCP : Susy Frizzle, MD  Allergies:   Allergies  Allergen Reactions  . Penicillins Anaphylaxis  . Allegra [Fexofenadine Hydrochloride]     palpitations  . Desloratadine     Palpitations   . Cephalexin Hives and Rash    Medications: Outpatient Encounter Medications as of 02/27/2020  Medication Sig  . ALPRAZolam (XANAX) 0.25 MG tablet Take 2 tablets (0.5 mg total) by mouth 3 (three) times daily as needed for anxiety.  Marland Kitchen azaTHIOprine (IMURAN) 50 MG tablet Take 100 mg by mouth daily. 2 pills once a day  . Calcium Carbonate-Vitamin D (CALTRATE 600+D) 600-400 MG-UNIT per chew tablet Chew 1 tablet by mouth 2 (two) times daily.    Marland Kitchen conjugated estrogens (PREMARIN) vaginal cream 0.5 g of cream intravaginally administered daily for 2 weeks, then reduce to twice weekly dose.  . docusate sodium (COLACE) 100 MG capsule Take 100 mg by mouth daily.  . Fluticasone-Salmeterol (ADVAIR DISKUS) 100-50 MCG/DOSE AEPB USE 1 INHALATION BY MOUTH INTO THE LUNGS EVERY 12 HOURS  . ipratropium (ATROVENT) 0.06 % nasal spray USE 1 SPRAY NASALLY THREE TIMES DAILY  . levocetirizine (XYZAL) 5 MG tablet Take 1 tablet (5 mg total) by mouth every evening.  . meloxicam (MOBIC) 15 MG tablet TAKE 1 TABLET(15 MG) BY MOUTH DAILY  . mometasone (ELOCON) 0.1 % cream Apply 1 application topically daily. (Patient taking differently: Apply 1 application topically daily as needed (skin irritations). )  . montelukast (SINGULAIR) 10 MG tablet TAKE 1 TABLET BY MOUTH AT BEDTIME  . nitrofurantoin, macrocrystal-monohydrate, (MACROBID) 100 MG capsule Take 1 capsule (100 mg total) by mouth 2 (two) times daily.  . rosuvastatin (CRESTOR) 20 MG tablet Take 20 mg by mouth daily.  . valsartan (DIOVAN) 80 MG tablet Take 1 tablet (80 mg total) by mouth daily.  . verapamil (CALAN-SR) 240 MG CR  tablet Take 1 tablet (240 mg total) by mouth daily.   No facility-administered encounter medications on file as of 02/27/2020.    Current Diagnosis: Patient Active Problem List   Diagnosis Date Noted  . Hypertension 07/06/2010  . Seasonal allergies 07/06/2010  . Fatigue 07/06/2010  . Asthma 07/06/2010  . Autoimmune hepatitis ([place]) 07/06/2010  . 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 Meningioma ([place]) 07/06/2010  . S/P cholecystectomy 07/06/2010  . Osteopenia 07/06/2010  . 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 No   Any changes in your medications or health? Recently prescribed valsartan by Dr. Dennard Schaumann, but has not starting taking it yet.   Any side effects from any medications? Not that she is aware of.   Do you have an symptoms or problems not managed by your medications? Not at this time   Any concerns about your health right now? Patient reports feeling tired frequently but has an autoimmune disease and tiredness is a side effect.   Has your provider asked that you check blood pressure, blood sugar, or follow special diet at home? PCP asked patient to check her BP at home prior to changing her medication to valsartan   Do you get any type of exercise on a regular basis? Very little.Patient states that she keeps busy taking care of her husband who has Parkinson's    Can you think of a  goal you would like to reach for your health? To maintain her health as much as possible   Do you have any problems getting your medications? No, patient receives her medication through a mail service with [facility]   Is there anything that you would like to discuss during the appointment? Would like clarity on the appropriateness of new medication valsartan.  Reminded the patient to please bring medications and supplements to appointment on Tuesday November 9th at [place], [place] Pharmacist Assistant [phone]

## 2020-02-28 ENCOUNTER — Ambulatory Visit: Payer: Medicare Other | Admitting: Pharmacist

## 2020-02-28 ENCOUNTER — Other Ambulatory Visit: Payer: Self-pay

## 2020-02-28 DIAGNOSIS — J45909 Unspecified asthma, uncomplicated: Secondary | ICD-10-CM

## 2020-02-28 DIAGNOSIS — I10 Essential (primary) hypertension: Secondary | ICD-10-CM

## 2020-02-28 DIAGNOSIS — M8580 Other specified disorders of bone density and structure, unspecified site: Secondary | ICD-10-CM

## 2020-02-28 NOTE — Patient Instructions (Addendum)
Visit Information Thank you for meeting with me today!  I look forward to working with you to help you meet all of your healthcare goals and answer any questions you may have.  Feel free to contact me anytime!  Goals Addressed            This Visit's Progress   . Pharmacy Care Plan:       CARE PLAN ENTRY (see longitudinal plan of care for additional care plan information)  Current Barriers:  . Chronic Disease Management support, education, and care coordination needs related to Hypertension, Asthma, and Osteopenia   Hypertension BP Readings from Last 3 Encounters:  02/06/20 (!) 156/76  12/05/19 120/72  07/22/18 128/64   . Pharmacist Clinical Goal(s): o Over the next 180 days, patient will work with PharmD and providers to achieve BP goal <140/90 . Current regimen:  Marland Kitchen Verapamil 240mg  CR . Valsartan 80mg  daily . Interventions: o Reviewed home blood pressure monitoring o Recommended morning dosing of new medication valsartan 80mg  starting after colonoscopy . Patient self care activities - Over the next 180 days, patient will: o Check BP daily, document, and provide at future appointments o Ensure daily salt intake < 2300 mg/day o Begin valsartan 80 mg every morning, monitor BP and contact providers with any consistent readings > 140/90  Asthma . Pharmacist Clinical Goal(s) o Over the next 180 days, patient will work with PharmD and providers to optimize medication and minimize symptoms of asthma. . Current regimen:  o Advair Diskus 100-13mcg o Singulair 10mg  . Interventions: o Reviewed proper inhaler use o Counseled on rinsing mouth after use . Patient self care activities - Over the next 180 days, patient will: o Continue to take meds as directed o Contact providers with any new or worsening symptoms  Osteopenia . Pharmacist Clinical Goal(s) o Over the next 180 days, patient will work with PharmD and providers to optimize medication and minimize symptoms of  osteopenia. . Current regimen:  o Calcium 600mg  + vitamin D3 . Interventions:   Recommend weight-bearing and muscle strengthening exercises for building and maintaining bone density.  . Patient self care activities - Over the next 180 days, patient will: o Continue Calcium + Vitamin D3 as recommended. Initial goal documentation        Veronica Flores was given information about Chronic Care Management services today including:  1. CCM service includes personalized support from designated clinical staff supervised by her physician, including individualized plan of care and coordination with other care providers 2. 24/7 contact phone numbers for assistance for urgent and routine care needs. 3. Standard insurance, coinsurance, copays and deductibles apply for chronic care management only during months in which we provide at least 20 minutes of these services. Most insurances cover these services at 100%, however patients may be responsible for any copay, coinsurance and/or deductible if applicable. This service may help you avoid the need for more expensive face-to-face services. 4. Only one practitioner may furnish and bill the service in a calendar month. 5. The patient may stop CCM services at any time (effective at the end of the month) by phone call to the office staff.  Patient agreed to services and verbal consent obtained.   The patient verbalized understanding of instructions provided today and agreed to receive a mailed copy of patient instruction and/or educational materials. Telephone follow up appointment with pharmacy team member scheduled for: 6 months  Beverly Milch, PharmD Clinical Pharmacist [place] [phone]   Osteopenia  Osteopenia is a loss of thickness (density) inside of the bones. Another name for osteopenia is low bone mass. Mild osteopenia is a normal part of aging. It is not a disease, and it does not cause symptoms. However, if you  have osteopenia and continue to lose bone mass, you could develop a condition that causes the bones to become thin and break more easily (osteoporosis). You may also lose some height, have back pain, and have a stooped posture. Although osteopenia is not a disease, making changes to your lifestyle and diet can help to prevent osteopenia from developing into osteoporosis. What are the causes? Osteopenia is caused by loss of calcium in the bones.  Bones are constantly changing. Old bone cells are continually being replaced with new bone cells. This process builds new bone. The mineral calcium is needed to build new bone and maintain bone density. Bone density is usually highest around age 54. After that, most people's bodies cannot replace all the bone they have lost with new bone. What increases the risk? You are more likely to develop this condition if:  You are older than age 75.  You are a woman who went through menopause early.  You have a long illness that keeps you in bed.  You do not get enough exercise.  You lack certain nutrients (malnutrition).  You have an overactive thyroid gland (hyperthyroidism).  You smoke.  You drink a lot of alcohol.  You are taking medicines that weaken the bones, such as steroids. What are the signs or symptoms? This condition does not cause any symptoms. You may have a slightly higher risk for bone breaks (fractures), so getting fractures more easily than normal may be an indication of osteopenia. How is this diagnosed? Your health care provider can diagnose this condition with a special type of X-ray exam that measures bone density (dual-energy X-ray absorptiometry, DEXA). This test can measure bone density in your hips, spine, and wrists. Osteopenia has no symptoms, so this condition is usually diagnosed after a routine bone density screening test is done for osteoporosis. This routine screening is usually done for:  Women who are age 83 or  older.  Men who are age 24 or older. If you have risk factors for osteopenia, you may have the screening test at an earlier age. How is this treated? Making dietary and lifestyle changes can lower your risk for osteoporosis. If you have severe osteopenia that is close to becoming osteoporosis, your health care provider may prescribe medicines and dietary supplements such as calcium and vitamin D. These supplements help to rebuild bone density. Follow these instructions at home:   Take over-the-counter and prescription medicines only as told by your health care provider. These include vitamins and supplements.  Eat a diet that is high in calcium and vitamin D. ? Calcium is found in dairy products, beans, salmon, and leafy green vegetables like spinach and broccoli. ? Look for foods that have vitamin D and calcium added to them (fortified foods), such as orange juice, cereal, and bread.  Do 30 or more minutes of a weight-bearing exercise every day, such as walking, jogging, or playing a sport. These types of exercises strengthen the bones.  Take precautions at home to lower your risk of falling, such as: ? Keeping rooms well-lit and free of clutter, such as cords. ? Installing safety rails on stairs. ? Using rubber mats in the bathroom or other areas that are often wet or slippery.  Do not use any  products that contain nicotine or tobacco, such as cigarettes and e-cigarettes. If you need help quitting, ask your health care provider.  Avoid alcohol or limit alcohol intake to no more than 1 drink a day for nonpregnant women and 2 drinks a day for men. One drink equals 12 oz of beer, 5 oz of wine, or 1 oz of hard liquor.  Keep all follow-up visits as told by your health care provider. This is important. Contact a health care provider if:  You have not had a bone density screening for osteoporosis and you are: ? A woman, age 43 or older. ? A man, age 13 or older.  You are a postmenopausal  woman who has not had a bone density screening for osteoporosis.  You are older than age 8 and you want to know if you should have bone density screening for osteoporosis. Summary  Osteopenia is a loss of thickness (density) inside of the bones. Another name for osteopenia is low bone mass.  Osteopenia is not a disease, but it may increase your risk for a condition that causes the bones to become thin and break more easily (osteoporosis).  You may be at risk for osteopenia if you are older than age 58 or if you are a woman who went through early menopause.  Osteopenia does not cause any symptoms, but it can be diagnosed with a bone density screening test.  Dietary and lifestyle changes are the first treatment for osteopenia. These may lower your risk for osteoporosis. This information is not intended to replace advice given to you by your health care provider. Make sure you discuss any questions you have with your health care provider. Document Revised: 03/20/2017 Document Reviewed: 01/14/2017 Elsevier Patient Education  2020 [employer].

## 2020-03-01 DIAGNOSIS — K635 Polyp of colon: Secondary | ICD-10-CM | POA: Diagnosis not present

## 2020-03-01 DIAGNOSIS — Z1211 Encounter for screening for malignant neoplasm of colon: Secondary | ICD-10-CM | POA: Diagnosis not present

## 2020-03-01 DIAGNOSIS — D126 Benign neoplasm of colon, unspecified: Secondary | ICD-10-CM | POA: Diagnosis not present

## 2020-03-01 DIAGNOSIS — D122 Benign neoplasm of ascending colon: Secondary | ICD-10-CM | POA: Diagnosis not present

## 2020-03-01 DIAGNOSIS — Z8371 Family history of colonic polyps: Secondary | ICD-10-CM | POA: Diagnosis not present

## 2020-03-01 DIAGNOSIS — K5730 Diverticulosis of large intestine without perforation or abscess without bleeding: Secondary | ICD-10-CM | POA: Diagnosis not present

## 2020-03-01 DIAGNOSIS — Z86010 Personal history of colonic polyps: Secondary | ICD-10-CM | POA: Diagnosis not present

## 2020-03-01 DIAGNOSIS — Z800 Family history of malignant neoplasm of digestive organs: Secondary | ICD-10-CM | POA: Diagnosis not present

## 2020-03-20 ENCOUNTER — Ambulatory Visit: Payer: Self-pay | Admitting: Pharmacist

## 2020-03-20 NOTE — Chronic Care Management (AMB) (Addendum)
Chronic Care Management   Follow Up Note   03/20/2020 Name: Veronica Flores MRN: [id number] DOB: August 06, 1945  Referred by: Susy Frizzle, MD Reason for referral : No chief complaint on file.   Veronica Flores is a 74 y.o. year old female who is a primary care patient of Pickard, Cammie Mcgee, MD. The CCM team was consulted for assistance with chronic disease management and care coordination needs.    Review of patient status, including review of consultants reports, relevant laboratory and other test results, and collaboration with appropriate care team members and the patient's provider was performed as part of comprehensive patient evaluation and provision of chronic care management services.    SDOH (Social Determinants of Health) assessments performed: No See Care Plan activities for detailed interventions related to [facility])      Outpatient Encounter Medications as of 03/20/2020  Medication Sig   ALPRAZolam (XANAX) 0.25 MG tablet Take 2 tablets (0.5 mg total) by mouth 3 (three) times daily as needed for anxiety.   azaTHIOprine (IMURAN) 50 MG tablet Take 100 mg by mouth daily. 2 pills once a day   Calcium Carbonate-Vitamin D (CALTRATE 600+D) 600-400 MG-UNIT per chew tablet Chew 1 tablet by mouth 2 (two) times daily.     conjugated estrogens (PREMARIN) vaginal cream 0.5 g of cream intravaginally administered daily for 2 weeks, then reduce to twice weekly dose.   docusate sodium (COLACE) 100 MG capsule Take 100 mg by mouth daily.   Fluticasone-Salmeterol (ADVAIR DISKUS) 100-50 MCG/DOSE AEPB USE 1 INHALATION BY MOUTH INTO THE LUNGS EVERY 12 HOURS   ipratropium (ATROVENT) 0.06 % nasal spray USE 1 SPRAY NASALLY THREE TIMES DAILY   levocetirizine (XYZAL) 5 MG tablet Take 1 tablet (5 mg total) by mouth every evening.   meloxicam (MOBIC) 15 MG tablet TAKE 1 TABLET(15 MG) BY MOUTH DAILY   mometasone (ELOCON) 0.1 % cream Apply 1 application topically daily. (Patient taking differently: Apply 1  application topically daily as needed (skin irritations). )   montelukast (SINGULAIR) 10 MG tablet TAKE 1 TABLET BY MOUTH AT BEDTIME   nitrofurantoin, macrocrystal-monohydrate, (MACROBID) 100 MG capsule Take 1 capsule (100 mg total) by mouth 2 (two) times daily. (Patient not taking: Reported on 02/28/2020)   rosuvastatin (CRESTOR) 20 MG tablet Take 20 mg by mouth daily.   valsartan (DIOVAN) 80 MG tablet Take 1 tablet (80 mg total) by mouth daily.   verapamil (CALAN-SR) 240 MG CR tablet Take 1 tablet (240 mg total) by mouth daily.   No facility-administered encounter medications on file as of 03/20/2020.     Objective:  Chart review to analyze care gaps for medicare and review adherence.  Goals Addressed   None     There are no care plans to display for this patient.  Care Gaps: BP > 140 No depression screen on file AWV not performed  MET: Non tobacco user Breast cancer screen normal Colorectal screening current  Adherence Reviewed fill dates for medications, patient appears to be 100% adherence with 0 days missed Patient gets 90 days supplies to promote adherence  Plan:   The patient has been provided with contact information for the care management team and has been advised to call with any health related questions or concerns.   Beverly Milch, PharmD Clinical Pharmacist [place] [phone]  I have collaborated with the care management provider regarding care management and care coordination activities outlined in this encounter and have reviewed this encounter including documentation in  the note and care plan. I am certifying that I agree with the content of this note and encounter as supervising physician.

## 2020-03-27 ENCOUNTER — Other Ambulatory Visit: Payer: Self-pay | Admitting: Family Medicine

## 2020-04-17 ENCOUNTER — Encounter: Payer: Self-pay | Admitting: Family Medicine

## 2020-04-17 ENCOUNTER — Other Ambulatory Visit: Payer: Self-pay | Admitting: Family Medicine

## 2020-04-17 MED ORDER — DOXYCYCLINE HYCLATE 100 MG PO TABS: 100.0000 mg | ORAL_TABLET | Freq: Two times a day (BID) | ORAL | 0 refills | Status: DC

## 2020-04-24 ENCOUNTER — Telehealth: Payer: Self-pay | Admitting: Pharmacist

## 2020-04-24 NOTE — Chronic Care Management (AMB) (Addendum)
Chronic Care Management Pharmacy Assistant   Name: Veronica Flores  MRN: [id number] DOB: 1945/09/09  Reason for Encounter: Disease State for HTN.   Patient Questions:  1.  Have you seen any other providers since your last visit? No.   2.  Any changes in your medicines or health? Yes.    PCP : Donita Brooks, MD   Their chronic conditions include: HTN, Asthma, Osteopenia.  Office Visits: None since 03/20/20  Consults: None since 03/20/20  Allergies:   Allergies  Allergen Reactions   Penicillins Anaphylaxis   Allegra [Fexofenadine Hydrochloride]     palpitations   Desloratadine     Palpitations    Cephalexin Hives and Rash    Medications: Outpatient Encounter Medications as of 04/24/2020  Medication Sig   ALPRAZolam (XANAX) 0.25 MG tablet Take 2 tablets (0.5 mg total) by mouth 3 (three) times daily as needed for anxiety.   azaTHIOprine (IMURAN) 50 MG tablet Take 100 mg by mouth daily. 2 pills once a day   Calcium Carbonate-Vitamin D (CALTRATE 600+D) 600-400 MG-UNIT per chew tablet Chew 1 tablet by mouth 2 (two) times daily.     conjugated estrogens (PREMARIN) vaginal cream 0.5 g of cream intravaginally administered daily for 2 weeks, then reduce to twice weekly dose.   docusate sodium (COLACE) 100 MG capsule Take 100 mg by mouth daily.   doxycycline (VIBRA-TABS) 100 MG tablet Take 1 tablet (100 mg total) by mouth 2 (two) times daily.   Fluticasone-Salmeterol (ADVAIR DISKUS) 100-50 MCG/DOSE AEPB USE 1 INHALATION BY MOUTH INTO THE LUNGS EVERY 12 HOURS   ipratropium (ATROVENT) 0.06 % nasal spray USE ONE SPRAY NASALLY THREE TIMES DAILY   levocetirizine (XYZAL) 5 MG tablet Take 1 tablet (5 mg total) by mouth every evening.   meloxicam (MOBIC) 15 MG tablet TAKE 1 TABLET(15 MG) BY MOUTH DAILY   mometasone (ELOCON) 0.1 % cream Apply 1 application topically daily. (Patient taking differently: Apply 1 application topically daily as needed (skin irritations). )   montelukast  (SINGULAIR) 10 MG tablet TAKE 1 TABLET BY MOUTH AT BEDTIME   nitrofurantoin, macrocrystal-monohydrate, (MACROBID) 100 MG capsule Take 1 capsule (100 mg total) by mouth 2 (two) times daily. (Patient not taking: Reported on 02/28/2020)   rosuvastatin (CRESTOR) 20 MG tablet Take 20 mg by mouth daily.   valsartan (DIOVAN) 80 MG tablet Take 1 tablet (80 mg total) by mouth daily.   verapamil (CALAN-SR) 240 MG CR tablet Take 1 tablet (240 mg total) by mouth daily.   No facility-administered encounter medications on file as of 04/24/2020.    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 Spoke with patient regarding BP  Recent Office Vitals: BP Readings from Last 3 Encounters:  02/06/20 (!) 156/76  12/05/19 120/72  07/22/18 128/64   Pulse Readings from Last 3 Encounters:  02/06/20 62  12/05/19 (!) 58  07/22/18 60    Wt Readings from Last 3 Encounters:  02/06/20 183 lb 3.2 oz (83.1 kg)  12/05/19 183 lb 3.2 oz (83.1 kg)  07/22/18 183 lb (83 kg)  Kidney Function Lab Results  Component Value Date/Time   CREATININE 0.82 07/22/2018 09:31 AM   CREATININE 0.82 01/14/2018 08:22 AM   GFRNONAA 71 07/22/2018 09:31 AM   GFRAA 83 07/22/2018 09:31 AM    BMP Latest Ref Rng & Units 07/22/2018 01/14/2018 07/13/2017  Glucose 65 - 99 mg/dL 89 78 967(E)  BUN 7 - 25 mg/dL 16 13 16   Creatinine 0.60 - 0.93 mg/dL 9.38 1.01  BUN/Creat Ratio 6 - 22 (calc) NOT APPLICABLE NOT APPLICABLE NOT APPLICABLE  Sodium 135 - 146 mmol/L 142 140 140  Potassium 3.5 - 5.3 mmol/L 4.5 4.5 4.5  Chloride 98 -  110 mmol/L 111(H) 110 109  CO2 20 - 32 mmol/L 19(L) 21 23  Calcium 8.6 - 10.4 mg/dL 9.9 9.4 9.7    Current antihypertensive regimen:  Verapamil 240 mg CR Valsartan 80 mg daily  How often are you checking your Blood Pressure? Patient stated usually she checks it about twice daily but hasn't lately because she was on antibiotics and steroids and it was causing her blood pressure to rise.  Current home BP readings: Patient did check her blood pressure this morning and it was 120/69 this morning.  What recent interventions/DTPs have been made by any provider to improve Blood Pressure control since last CPP Visit: None  Any recent hospitalizations or ED visits since last visit with CPP? Patient stated no.  What diet changes have been made to improve Blood Pressure Control?  Patient stated she hasn't changed the way she eats that much but she does cook most of her foods she doesn't buy fast food, box or canned food. Patient stated she does use less salt.   What exercise is being done to improve your Blood Pressure Control?  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  Adherence Review: Is the patient currently on ACE/ARB medication? No.  Does the patient have >5 day gap between last estimated fill dates? No, CPP Please Check.   Patient stated she doesn't get much sleep from a result of her husbands nightmares/dreams.    Follow-Up:  Pharmacist Review   7.51, RMA Clinical Pharmacist Assistant [phone] 5 minutes spent in review, coordination, and documentation.  Reviewed by: [phone], PharmD Clinical Pharmacist [facility] [phone]

## 2020-05-07 ENCOUNTER — Telehealth: Payer: Self-pay | Admitting: Pharmacist

## 2020-05-07 NOTE — Progress Notes (Addendum)
Chronic Care Management Pharmacy Assistant   Name: Veronica Flores  MRN: [id number] DOB: Dec 19, 1945  Reason for Encounter: Text Message From The Patient   PCP : Susy Frizzle, MD  Their chronic conditions include: HTN, Asthma, Osteopenia.  Office Visits: None since 04/24/20  Consults: None since 04/24/20  Allergies:   Allergies  Allergen Reactions   Penicillins Anaphylaxis   Allegra [Fexofenadine Hydrochloride]     palpitations   Desloratadine     Palpitations    Cephalexin Hives and Rash    Medications: Outpatient Encounter Medications as of 05/07/2020  Medication Sig   ALPRAZolam (XANAX) 0.25 MG tablet Take 2 tablets (0.5 mg total) by mouth 3 (three) times daily as needed for anxiety.   azaTHIOprine (IMURAN) 50 MG tablet Take 100 mg by mouth daily. 2 pills once a day   Calcium Carbonate-Vitamin D (CALTRATE 600+D) 600-400 MG-UNIT per chew tablet Chew 1 tablet by mouth 2 (two) times daily.     conjugated estrogens (PREMARIN) vaginal cream 0.5 g of cream intravaginally administered daily for 2 weeks, then reduce to twice weekly dose.   docusate sodium (COLACE) 100 MG capsule Take 100 mg by mouth daily.   doxycycline (VIBRA-TABS) 100 MG tablet Take 1 tablet (100 mg total) by mouth 2 (two) times daily.   Fluticasone-Salmeterol (ADVAIR DISKUS) 100-50 MCG/DOSE AEPB USE 1 INHALATION BY MOUTH INTO THE LUNGS EVERY 12 HOURS   ipratropium (ATROVENT) 0.06 % nasal spray USE ONE SPRAY NASALLY THREE TIMES DAILY   levocetirizine (XYZAL) 5 MG tablet Take 1 tablet (5 mg total) by mouth every evening.   meloxicam (MOBIC) 15 MG tablet TAKE 1 TABLET(15 MG) BY MOUTH DAILY   mometasone (ELOCON) 0.1 % cream Apply 1 application topically daily. (Patient taking differently: Apply 1 application topically daily as needed (skin irritations). )   montelukast (SINGULAIR) 10 MG tablet TAKE 1 TABLET BY MOUTH AT BEDTIME   nitrofurantoin, macrocrystal-monohydrate, (MACROBID) 100 MG capsule Take 1  capsule (100 mg total) by mouth 2 (two) times daily. (Patient not taking: Reported on 02/28/2020)   rosuvastatin (CRESTOR) 20 MG tablet Take 20 mg by mouth daily.   valsartan (DIOVAN) 80 MG tablet Take 1 tablet (80 mg total) by mouth daily.   verapamil (CALAN-SR) 240 MG CR tablet Take 1 tablet (240 mg total) by mouth daily.   No facility-administered encounter medications on file as of 05/07/2020.    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 10am 139/71 1/7 830am 135/76 1/8 830am 132/78 This was for the 5 days following our conversation. The following week were about the same. 1/9 830am 142/75 1230pm 136/72 1/10 11am 123/67 1/12 10am 135/72 12pm 126/74 1/16 11am 133/62 1/17 10am 134/71 I am taking my verapamil at bedtime and the added one..Valsartan in the morning with breakfast. When I take it in the morning it does not seem to lower my pressures if you look at the early am and then the later day  readings. Thanks.Veronica Flores  Follow-Up:  Pharmacist Review   Charlann Lange, RMA Clinical Pharmacist Assistant [phone]   6 minutes spent in review, coordination, and documentation.  Reviewed by: Beverly Milch, PharmD Clinical Pharmacist [facility] [phone]

## 2020-05-09 ENCOUNTER — Encounter: Payer: Self-pay | Admitting: Family Medicine

## 2020-05-09 ENCOUNTER — Other Ambulatory Visit: Payer: Self-pay

## 2020-05-09 DIAGNOSIS — E7800 Pure hypercholesterolemia, unspecified: Secondary | ICD-10-CM

## 2020-05-09 MED ORDER — ROSUVASTATIN CALCIUM 20 MG PO TABS: 20.0000 mg | ORAL_TABLET | Freq: Every day | ORAL | 1 refills | Status: DC

## 2020-05-21 ENCOUNTER — Telehealth: Payer: Self-pay | Admitting: Pharmacist

## 2020-05-21 NOTE — Progress Notes (Addendum)
Chronic Care Management Pharmacy Assistant   Name: Veronica Flores  MRN: [id number] DOB: Nov 29, 1945  Reason for Encounter: Blood pressure Readings  PCP : Susy Frizzle, MD  Allergies:   Allergies  Allergen Reactions   Penicillins Anaphylaxis   Allegra [Fexofenadine Hydrochloride]     palpitations   Desloratadine     Palpitations    Cephalexin Hives and Rash    Medications: Outpatient Encounter Medications as of 05/21/2020  Medication Sig   ALPRAZolam (XANAX) 0.25 MG tablet Take 2 tablets (0.5 mg total) by mouth 3 (three) times daily as needed for anxiety.   azaTHIOprine (IMURAN) 50 MG tablet Take 100 mg by mouth daily. 2 pills once a day   Calcium Carbonate-Vitamin D (CALTRATE 600+D) 600-400 MG-UNIT per chew tablet Chew 1 tablet by mouth 2 (two) times daily.     conjugated estrogens (PREMARIN) vaginal cream 0.5 g of cream intravaginally administered daily for 2 weeks, then reduce to twice weekly dose.   docusate sodium (COLACE) 100 MG capsule Take 100 mg by mouth daily.   doxycycline (VIBRA-TABS) 100 MG tablet Take 1 tablet (100 mg total) by mouth 2 (two) times daily.   Fluticasone-Salmeterol (ADVAIR DISKUS) 100-50 MCG/DOSE AEPB USE 1 INHALATION BY MOUTH INTO THE LUNGS EVERY 12 HOURS   ipratropium (ATROVENT) 0.06 % nasal spray USE ONE SPRAY NASALLY THREE TIMES DAILY   levocetirizine (XYZAL) 5 MG tablet Take 1 tablet (5 mg total) by mouth every evening.   meloxicam (MOBIC) 15 MG tablet TAKE 1 TABLET(15 MG) BY MOUTH DAILY   mometasone (ELOCON) 0.1 % cream Apply 1 application topically daily. (Patient taking differently: Apply 1 application topically daily as needed (skin irritations). )   montelukast (SINGULAIR) 10 MG tablet TAKE 1 TABLET BY MOUTH AT BEDTIME   nitrofurantoin, macrocrystal-monohydrate, (MACROBID) 100 MG capsule Take 1 capsule (100 mg total) by mouth 2 (two) times daily. (Patient not taking: Reported on 02/28/2020)   rosuvastatin (CRESTOR) 20 MG tablet Take 1  tablet (20 mg total) by mouth daily.   valsartan (DIOVAN) 80 MG tablet Take 1 tablet (80 mg total) by mouth daily.   verapamil (CALAN-SR) 240 MG CR tablet Take 1 tablet (240 mg total) by mouth daily.   No facility-administered encounter medications on file as of 05/21/2020.    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 She as appointment with you tomorrow.  The first bp for each day is before my I took my Valsartan ..the second is afterward... 1/18 8:30am 136/75 930am 132/65 1/19 745am 131/75 905am 126/57 1/20 800am 140/78 930am 124/71 1/21 730am 135/73 830am 128/76 1/22 830am 138/73 1030am140/65 1/23 1030am154/75 11am 133/74 1/24 7am 133/72 730am 131/66 1/25 8am 138/72 9am 132/69 1/26 730am 136/72 820am 143/78 1/28 830am 132/74 930am 134/73 11am 141/76 1/29 730am 133/67 830am 127/70 1/30 730am 133/67 830am 123/73 I /31 7am 135/76 8am 124/72 The meds have lowered my bp a little a few days but some days it has actually went up after the rx. I am confused about Dr. Antony Contras expectations for the rx.  Usually my BP is lower than the mid 130s. I am sure lower would be helpful. Thanks.Veronica Flores   Follow-Up:  Pharmacist Review   Charlann Lange, RMA Clinical Pharmacist Assistant [phone]  8 minutes spent in review, coordination, and documentation.  Reviewed by: Beverly Milch, PharmD Clinical Pharmacist [facility] [phone]

## 2020-05-22 ENCOUNTER — Ambulatory Visit: Payer: Medicare Other | Admitting: Pharmacist

## 2020-05-22 DIAGNOSIS — I10 Essential (primary) hypertension: Secondary | ICD-10-CM

## 2020-05-22 DIAGNOSIS — M8580 Other specified disorders of bone density and structure, unspecified site: Secondary | ICD-10-CM

## 2020-05-22 DIAGNOSIS — J45909 Unspecified asthma, uncomplicated: Secondary | ICD-10-CM

## 2020-05-22 NOTE — Patient Instructions (Addendum)
Visit Information  Goals Addressed            This Visit's Progress   . Pharmacy Care Plan:       CARE PLAN ENTRY (see longitudinal plan of care for additional care plan information)  Current Barriers:  . Chronic Disease Management support, education, and care coordination needs related to Hypertension, Asthma, and Osteopenia   Hypertension BP Readings from Last 3 Encounters:  02/06/20 (!) 156/76  12/05/19 120/72  07/22/18 128/64   . Pharmacist Clinical Goal(s): o Over the next 120 days, patient will work with PharmD and providers to achieve BP goal <140/90 . Current regimen:  Marland Kitchen Verapamil 240mg  CR . Valsartan 80mg  daily . Interventions: o Reviewed home blood pressure monitoring o Discussed BP goals . Patient self care activities - Over the next 120 days, patient will: o Check BP 2-3 times per week, document, and provide at future appointments o Ensure daily salt intake < 2300 mg/day  Asthma . Pharmacist Clinical Goal(s) o Over the next 120 days, patient will work with PharmD and providers to optimize medication and minimize symptoms of asthma. . Current regimen:  o Advair Diskus 100-44mcg o Singulair 10mg  . Interventions: o Reviewed proper inhaler use o Counseled on rinsing mouth after use . Patient self care activities - Over the next 120 days, patient will: o Continue to take meds as directed o Contact providers with any new or worsening symptoms  Osteopenia . Pharmacist Clinical Goal(s) o Over the next 180 days, patient will work with PharmD and providers to optimize medication and minimize symptoms of osteopenia. . Current regimen:  o Calcium 600mg  + vitamin D3 . Interventions:   Recommend weight-bearing and muscle strengthening exercises for building and maintaining bone density.  . Patient self care activities - Over the next 180 days, patient will: o Continue Calcium + Vitamin D3 as recommended. Please see past updates related to this goal by clicking on  the "Past Updates" button in the selected goal         The patient verbalized understanding of instructions, educational materials, and care plan provided today and agreed to receive a mailed copy of patient instructions, educational materials, and care plan.   Telephone follow up appointment with pharmacy team member scheduled for: 4 months  Edythe Clarity, [facility]  Blood Pressure Record Sheet To take your blood pressure, you will need a blood pressure machine. You can buy a blood pressure machine (blood pressure monitor) at your clinic, drug store, or online. When choosing one, consider:  An automatic monitor that has an arm cuff.  A cuff that wraps snugly around your upper arm. You should be able to fit only one finger between your arm and the cuff.  A device that stores blood pressure reading results.  Do not choose a monitor that measures your blood pressure from your wrist or finger. Follow your health care provider's instructions for how to take your blood pressure. To use this form:  Get one reading in the morning (a.m.) before you take any medicines.  Get one reading in the evening (p.m.) before supper.  Take at least 2 readings with each blood pressure check. This makes sure the results are correct. Wait 1-2 minutes between measurements.  Write down the results in the spaces on this form.  Repeat this once a week, or as told by your health care provider.  Make a follow-up appointment with your health care provider to discuss the results. Blood pressure log Date: _______________________  a.m. _____________________(1st reading) _____________________(2nd reading)  p.m. _____________________(1st reading) _____________________(2nd reading) Date: _______________________  a.m. _____________________(1st reading) _____________________(2nd reading)  p.m. _____________________(1st reading) _____________________(2nd reading) Date: _______________________  a.m.  _____________________(1st reading) _____________________(2nd reading)  p.m. _____________________(1st reading) _____________________(2nd reading) Date: _______________________  a.m. _____________________(1st reading) _____________________(2nd reading)  p.m. _____________________(1st reading) _____________________(2nd reading) Date: _______________________  a.m. _____________________(1st reading) _____________________(2nd reading)  p.m. _____________________(1st reading) _____________________(2nd reading) This information is not intended to replace advice given to you by your health care provider. Make sure you discuss any questions you have with your health care provider. Document Revised: 07/27/2019 Document Reviewed: 07/27/2019 Elsevier Patient Education  2021 [employer].

## 2020-05-22 NOTE — Chronic Care Management (AMB) (Signed)
Chronic Care Management Pharmacy  Name: Veronica Flores  MRN: [id number] DOB: 1945-10-29   Chief Complaint/ HPI  Gibson Ramp,  75 y.o. , female presents for their Follow-Up CCM visit with the clinical pharmacist In office.  PCP : Susy Frizzle, MD  Their chronic conditions include: HTN, Asthma, Osteopenia.  Office Visits: 02/06/2020 ([place]) - Blood pressure today is elevated, she is dealing with stress over her husband's health which involves end-stage Parkinson's disease.  Given history of TIA recommended to keep her LDL < 70.  12/05/2019 [place] [facility]) - signs/symptoms of UTI.  Given Macrobid while urine culture is pending.   Consult Visit: none recent  Medications: Outpatient Encounter Medications as of 05/22/2020  Medication Sig  . ALPRAZolam (XANAX) 0.25 MG tablet Take 2 tablets (0.5 mg total) by mouth 3 (three) times daily as needed for anxiety.  Marland Kitchen azaTHIOprine (IMURAN) 50 MG tablet Take 100 mg by mouth daily. 2 pills once a day  . Calcium Carbonate-Vitamin D 600-400 MG-UNIT chew tablet Chew 1 tablet by mouth 2 (two) times daily.  Marland Kitchen docusate sodium (COLACE) 100 MG capsule Take 100 mg by mouth daily.  Marland Kitchen doxycycline (VIBRA-TABS) 100 MG tablet Take 1 tablet (100 mg total) by mouth 2 (two) times daily.  . Fluticasone-Salmeterol (ADVAIR DISKUS) 100-50 MCG/DOSE AEPB USE 1 INHALATION BY MOUTH INTO THE LUNGS EVERY 12 HOURS  . ipratropium (ATROVENT) 0.06 % nasal spray USE ONE SPRAY NASALLY THREE TIMES DAILY  . levocetirizine (XYZAL) 5 MG tablet Take 1 tablet (5 mg total) by mouth every evening.  . meloxicam (MOBIC) 15 MG tablet TAKE 1 TABLET(15 MG) BY MOUTH DAILY  . mometasone (ELOCON) 0.1 % cream Apply 1 application topically daily. (Patient taking differently: Apply 1 application topically daily as needed (skin irritations).)  . montelukast (SINGULAIR) 10 MG tablet TAKE 1 TABLET BY MOUTH AT BEDTIME  . nitrofurantoin, macrocrystal-monohydrate, (MACROBID) 100 MG capsule Take 1  capsule (100 mg total) by mouth 2 (two) times daily.  . rosuvastatin (CRESTOR) 20 MG tablet Take 1 tablet (20 mg total) by mouth daily.  . valsartan (DIOVAN) 80 MG tablet Take 1 tablet (80 mg total) by mouth daily.  . verapamil (CALAN-SR) 240 MG CR tablet Take 1 tablet (240 mg total) by mouth daily.  Marland Kitchen conjugated estrogens (PREMARIN) vaginal cream 0.5 g of cream intravaginally administered daily for 2 weeks, then reduce to twice weekly dose.   No facility-administered encounter medications on file as of 05/22/2020.     Current Diagnosis/Assessment: Emergency planning/management officer Strain: Low Risk   . Difficulty of Paying Living Expenses: Not very hard    Goals Addressed            This Visit's Progress   . Pharmacy Care Plan:       CARE PLAN ENTRY (see longitudinal plan of care for additional care plan information)  Current Barriers:  . Chronic Disease Management support, education, and care coordination needs related to Hypertension, Asthma, and Osteopenia   Hypertension BP Readings from Last 3 Encounters:  02/06/20 (!) 156/76  12/05/19 120/72  07/22/18 128/64   . Pharmacist Clinical Goal(s): o Over the next 120 days, patient will work with PharmD and providers to achieve BP goal <140/90 . Current regimen:  Marland Kitchen Verapamil 240mg  CR . Valsartan 80mg  daily . Interventions: o Reviewed home blood pressure monitoring o Discussed BP goals . Patient self care activities - Over the next 120 days, patient will: o Check BP 2-3 times per week, document, and provide  at future appointments o Ensure daily salt intake < 2300 mg/day  Asthma . Pharmacist Clinical Goal(s) o Over the next 120 days, patient will work with PharmD and providers to optimize medication and minimize symptoms of asthma. . Current regimen:  o Advair Diskus 100-33mcg o Singulair 10mg  . Interventions: o Reviewed proper inhaler use o Counseled on rinsing mouth after use . Patient self care activities - Over the next 120 days,  patient will: o Continue to take meds as directed o Contact providers with any new or worsening symptoms  Osteopenia . Pharmacist Clinical Goal(s) o Over the next 180 days, patient will work with PharmD and providers to optimize medication and minimize symptoms of osteopenia. . Current regimen:  o Calcium 600mg  + vitamin D3 . Interventions:   Recommend weight-bearing and muscle strengthening exercises for building and maintaining bone density.  . Patient self care activities - Over the next 180 days, patient will: o Continue Calcium + Vitamin D3 as recommended. Please see past updates related to this goal by clicking on the "Past Updates" button in the selected goal         Asthma    Eosinophil count:   Lab Results  Component Value Date/Time   EOSPCT 0.0 07/22/2018 09:31 AM  %                               Eos (Absolute):  Lab Results  Component Value Date/Time   EOSABS 0 (L) 07/22/2018 09:31 AM    Tobacco Status:  Social History   Tobacco Use  Smoking Status Never Smoker  Smokeless Tobacco Never Used    Patient has failed these meds in past: none noted Patient is currently controlled on the following medications:   Advair Diskus 100-38mcg  Singulair 10mg  Using maintenance inhaler regularly? Yes Frequency of rescue inhaler use:  N/A  We discussed:    Asthma currently controlled, only using Advair once daily mostly, will occasionally have to use two doses per day  Reports cost is affordable currently does not ever enter donut hole  Counseled on rinsing mouth after use and proper inhaler usage.  Denies SOB currently, no limitations on ADLs  Update 05/22/20 Breathing controlled Using inhalers properly and copay affordable  Plan  Continue current medications  Hypertension   BP goal is:  <140/90  Office blood pressures are  BP Readings from Last 3 Encounters:  02/06/20 (!) 156/76  12/05/19 120/72  07/22/18 128/64   Patient checks BP at home  daily Patient home BP readings are ranging: 130s/70s  Patient has failed these meds in the past: none noted Patient is currently controlled on the following medications:  Marland Kitchen Verapamil 240mg  CR . Valsartan 80mg  daily  We discussed   She has not started taking her valsartan yet, due to an upcoming colonoscopy and confusion on what time of day she was supposed to take it.  Recommended AM dosing since she takes her Verapamil in the evening, she will hold until after her colonoscopy and begin taking this Friday.  Counseled to monitor her blood pressure daily 1-2 hours after taking Valsartan in the am, record numbers and report back to [place] if BP is too low or > 140/90.  Update 05/22/20 Patients home BP from recent check in. 1/18 8:30am 136/75 930am 132/65 1/19 745am 131/75 905am 126/57 1/20 800am 140/78 930am 124/71 1/21 730am 135/73 830am 128/76 1/22 830am 138/73 1030am140/65 1/23 1030am154/75 11am 133/74 1/24 7am  133/72 730am 131/66 1/25 8am 138/72 9am 132/69 1/26 730am 136/72 820am 143/78 1/28 830am 132/74 930am 134/73 11am 141/76 1/29 730am 133/67 830am 127/70 1/30 730am 133/67 830am 123/73 1 /31 7am 135/76 8am 124/72  We discussed: - She was concerned that her BP was still too high - Counseled her on goal < 140/90, majority of her home BP is within this range - She denies dizziness or headaches - Recent increase of BP could be related to stress at home taking care of husband - She wishes for me to pass these BP along to Dr. Dennard Schaumann - Have asked her to continue to monitor at least a few times per week at various times  Plan  Continue current medications  Continue to monitor and report consistent > 140/90 to providers.    Osteopenia   Last DEXA Scan: 07/20/2019  T-Score femoral neck:-1.3   No results found for: VD25OH   Patient is not a candidate for pharmacologic treatment  Patient has failed these meds in past: none noted Patient is currently  controlled on the following medications:   Calcium 600mg  + vitamin D3 We discussed:    Recommend weight-bearing and muscle strengthening exercises for building and maintaining bone density.   DEXA is up to date, repeat in two years   Update 05/22/20 Still taking calcium + vitamin D DEXA due March 2023  Plan  Continue current medications   Vaccines   Reviewed and discussed patient's vaccination history.    Immunization History  Administered Date(s) Administered  . Fluad Quad(high Dose 65+) 01/11/2019, 02/06/2020  . Hepatitis A 12/19/2003, 10/13/2006  . Hepatitis B 12/19/2003, 10/13/2006  . Influenza Whole 01/02/2010  . Influenza, High Dose Seasonal PF 02/05/2017, 01/14/2018  . Influenza,inj,Quad PF,6+ Mos 01/27/2013, 02/09/2014, 02/08/2015, 02/07/2016  . PFIZER(Purple Top)SARS-COV-2 Vaccination 05/28/2019, 06/21/2019  . Pneumococcal Conjugate-13 05/27/2013  . Pneumococcal Polysaccharide-23 01/20/2004, 09/02/2010, 01/14/2018  . Td 04/22/2003  . Tdap 03/18/2011    Plan  Recommended patient receive Shingrix vaccine in pharmacy  Medication Management   . Miscellaneous medications:  o Azathioprine 50mg  2 tablets daily o Ipratropium 0.06% nasal spray o Meloxicam 15mg  prn o Rosuvastatin 20mg  . OTC's:  o ASA 81mg  o Docusate 100mg  . Patient currently uses [facility].  . Patient reports using pill organizer jar method to organize medications and promote adherence. . Patient denies missed doses of medication.   Beverly Milch, PharmD Clinical Pharmacist [place] [phone]

## 2020-07-09 ENCOUNTER — Telehealth: Payer: Self-pay | Admitting: Pharmacist

## 2020-07-09 NOTE — Progress Notes (Addendum)
Chronic Care Management Pharmacy Assistant   Name: Veronica Flores  MRN: [id number] DOB: 1946-03-31  Reason for Encounter: Disease State For HTN.   Conditions to be addressed/monitored: HTN, Asthma, Osteopenia.  Recent office visits:  None since 05/22/20  Recent consult visits:  None since 05/22/20  Hospital visits:  None in previous 6 months  Medications: Outpatient Encounter Medications as of 07/09/2020  Medication Sig   ALPRAZolam (XANAX) 0.25 MG tablet Take 2 tablets (0.5 mg total) by mouth 3 (three) times daily as needed for anxiety.   azaTHIOprine (IMURAN) 50 MG tablet Take 100 mg by mouth daily. 2 pills once a day   Calcium Carbonate-Vitamin D 600-400 MG-UNIT chew tablet Chew 1 tablet by mouth 2 (two) times daily.   conjugated estrogens (PREMARIN) vaginal cream 0.5 g of cream intravaginally administered daily for 2 weeks, then reduce to twice weekly dose.   docusate sodium (COLACE) 100 MG capsule Take 100 mg by mouth daily.   doxycycline (VIBRA-TABS) 100 MG tablet Take 1 tablet (100 mg total) by mouth 2 (two) times daily.   Fluticasone-Salmeterol (ADVAIR DISKUS) 100-50 MCG/DOSE AEPB USE 1 INHALATION BY MOUTH INTO THE LUNGS EVERY 12 HOURS   ipratropium (ATROVENT) 0.06 % nasal spray USE ONE SPRAY NASALLY THREE TIMES DAILY   levocetirizine (XYZAL) 5 MG tablet Take 1 tablet (5 mg total) by mouth every evening.   meloxicam (MOBIC) 15 MG tablet TAKE 1 TABLET(15 MG) BY MOUTH DAILY   mometasone (ELOCON) 0.1 % cream Apply 1 application topically daily. (Patient taking differently: Apply 1 application topically daily as needed (skin irritations).)   montelukast (SINGULAIR) 10 MG tablet TAKE 1 TABLET BY MOUTH AT BEDTIME   nitrofurantoin, macrocrystal-monohydrate, (MACROBID) 100 MG capsule Take 1 capsule (100 mg total) by mouth 2 (two) times daily.   rosuvastatin (CRESTOR) 20 MG tablet Take 1 tablet (20 mg total) by mouth daily.   valsartan (DIOVAN) 80 MG tablet Take 1 tablet (80 mg  total) by mouth daily.   verapamil (CALAN-SR) 240 MG CR tablet Take 1 tablet (240 mg total) by mouth daily.   No facility-administered encounter medications on file as of 07/09/2020.   Reviewed chart prior to disease state call. Spoke with patient regarding BP  Recent Office Vitals: BP Readings from Last 3 Encounters:  02/06/20 (!) 156/76  12/05/19 120/72  07/22/18 128/64   Pulse Readings from Last 3 Encounters:  02/06/20 62  12/05/19 (!) 58  07/22/18 60    Wt Readings from Last 3 Encounters:  02/06/20 183 lb 3.2 oz (83.1 kg)  12/05/19 183 lb 3.2 oz (83.1 kg)  07/22/18 183 lb (83 kg)     Kidney Function Lab Results  Component Value Date/Time   CREATININE 0.82 07/22/2018 09:31 AM   CREATININE 0.82 01/14/2018 08:22 AM   GFRNONAA 71 07/22/2018 09:31 AM   GFRAA 83 07/22/2018 09:31 AM    BMP Latest Ref Rng & Units 07/22/2018 01/14/2018 07/13/2017  Glucose 65 - 99 mg/dL 89 78 102(H)  BUN 7 - 25 mg/dL 16 13 16   Creatinine 0.60 - 0.93 mg/dL 0.82 0.82 0.88  BUN/Creat Ratio 6 - 22 (calc) NOT APPLICABLE NOT APPLICABLE NOT APPLICABLE  Sodium 027 - 146 mmol/L 142 140 140  Potassium 3.5 - 5.3 mmol/L 4.5 4.5 4.5  Chloride 98 - 110 mmol/L 111(H) 110 109  CO2 20 - 32 mmol/L 19(L) 21 23  Calcium 8.6 - 10.4 mg/dL 9.9 9.4 9.7    Current antihypertensive regimen:  Verapamil 240 mg  CR Valsartan 80 mg daily  How often are you checking your Blood Pressure? 1-2x per week    Current home BP readings:  2/18 118/73 06/10/20 137/67 06/12/20 121/76 06/18/20 127/69 06/25/20 129/76 07/05/20 125/72  What recent interventions/DTPs have been made by any provider to improve Blood Pressure control since last CPP Visit: None.  Any recent hospitalizations or ED visits since last visit with CPP? Patient stated no.  What diet changes have been made to improve Blood Pressure Control?  Patient stated she hasn't changed her diet since the last time we spoke in January. She stated she drinks a lot of water  through out the day.   What exercise is being done to improve your Blood Pressure Control?  Patient stated she's been outside doing some yard work.   Adherence Review: Is the patient currently on ACE/ARB medication? Rosuvastatin 20 mg  Does the patient have >5 day gap between last estimated fill dates? Per misc rpts, no.   Star Rating Drugs:Rosuvastatin 20 mg 1 tablet daily. 05/09/20 90 DS.  Follow-Up: Pharmacist Review  Charlann Lange, RMA Clinical Pharmacist Assistant [phone]   7 minutes spent in review, coordination, and documentation.  Reviewed by: Beverly Milch, PharmD Clinical Pharmacist [facility] [phone]

## 2020-08-06 ENCOUNTER — Encounter: Payer: Self-pay | Admitting: Family Medicine

## 2020-08-06 ENCOUNTER — Other Ambulatory Visit: Payer: Self-pay

## 2020-08-06 ENCOUNTER — Ambulatory Visit (INDEPENDENT_AMBULATORY_CARE_PROVIDER_SITE_OTHER): Payer: Medicare Other | Admitting: Family Medicine

## 2020-08-06 VITALS — BP 140/82 | HR 70 | Temp 97.9°F | Resp 16 | Ht 67.0 in | Wt 183.0 lb

## 2020-08-06 DIAGNOSIS — I10 Essential (primary) hypertension: Secondary | ICD-10-CM

## 2020-08-06 DIAGNOSIS — I421 Obstructive hypertrophic cardiomyopathy: Secondary | ICD-10-CM

## 2020-08-06 DIAGNOSIS — E7800 Pure hypercholesterolemia, unspecified: Secondary | ICD-10-CM

## 2020-08-06 LAB — CBC WITH DIFFERENTIAL/PLATELET
Absolute Monocytes: 362 cells/uL (ref 200–950)
Basophils Absolute: 22 cells/uL (ref 0–200)
Basophils Relative: 0.4 %
Eosinophils Absolute: 11 cells/uL — ABNORMAL LOW (ref 15–500)
Eosinophils Relative: 0.2 %
HCT: 47.1 % — ABNORMAL HIGH (ref 35.0–45.0)
Hemoglobin: 15 g/dL (ref 11.7–15.5)
Lymphs Abs: 1339 cells/uL (ref 850–3900)
MCH: 28.6 pg (ref 27.0–33.0)
MCHC: 31.8 g/dL — ABNORMAL LOW (ref 32.0–36.0)
MCV: 89.7 fL (ref 80.0–100.0)
MPV: 9.4 fL (ref 7.5–12.5)
Monocytes Relative: 6.7 %
Neutro Abs: 3667 cells/uL (ref 1500–7800)
Neutrophils Relative %: 67.9 %
Platelets: 320 10*3/uL (ref 140–400)
RBC: 5.25 10*6/uL — ABNORMAL HIGH (ref 3.80–5.10)
RDW: 13.3 % (ref 11.0–15.0)
Total Lymphocyte: 24.8 %
WBC: 5.4 10*3/uL (ref 3.8–10.8)

## 2020-08-06 LAB — LIPID PANEL
Cholesterol: 98 mg/dL (ref <200)
HDL: 34 mg/dL — ABNORMAL LOW (ref >=50)
LDL Cholesterol (Calc): 44 mg/dL (calc)
Non-HDL Cholesterol (Calc): 64 mg/dL (calc) (ref <130)
Total CHOL/HDL Ratio: 2.9 (calc) (ref <5.0)
Triglycerides: 123 mg/dL (ref <150)

## 2020-08-06 LAB — COMPLETE METABOLIC PANEL WITH GFR
AG Ratio: 1.7 (calc) (ref 1.0–2.5)
ALT: 14 U/L (ref 6–29)
AST: 22 U/L (ref 10–35)
Albumin: 4.5 g/dL (ref 3.6–5.1)
Alkaline phosphatase (APISO): 46 U/L (ref 37–153)
BUN: 17 mg/dL (ref 7–25)
CO2: 24 mmol/L (ref 20–32)
Calcium: 9.7 mg/dL (ref 8.6–10.4)
Chloride: 106 mmol/L (ref 98–110)
Creat: 0.88 mg/dL (ref 0.60–0.93)
GFR, Est African American: 75 mL/min/{1.73_m2} (ref >=60)
GFR, Est Non African American: 65 mL/min/{1.73_m2} (ref >=60)
Globulin: 2.6 g/dL (calc) (ref 1.9–3.7)
Glucose, Bld: 104 mg/dL — ABNORMAL HIGH (ref 65–99)
Potassium: 4.5 mmol/L (ref 3.5–5.3)
Sodium: 139 mmol/L (ref 135–146)
Total Bilirubin: 0.6 mg/dL (ref 0.2–1.2)
Total Protein: 7.1 g/dL (ref 6.1–8.1)

## 2020-08-06 NOTE — Progress Notes (Signed)
Subjective:    Patient ID: Veronica Flores, female    DOB: 09/24/1945, 75 y.o.   MRN: [id number]   Patient is a very pleasant 75 year old Caucasian female who presents today for a checkup.  She has a history of hypertrophic cardiomyopathy, hypertension, and hyperlipidemia.  Her mammogram, colonoscopy are all up-to-date.  Her immunizations are up-to-date.  She denies any chest pain shortness of breath or dyspnea on exertion.  Her blood pressure today is well controlled.  She is still dealing with the stress of caring for her husband who has end-stage Parkinson's disease.  This appears to be her biggest issue at the present time.  However from a medical standpoint she seems to be doing well.  She states that she has had 2 ocular migraines in the last 6 months.  Aside from that she feels healthy.  She denies any melena or hematochezia, nausea or vomiting or abdominal pain Past Medical History:  Diagnosis Date  . Allergy   . Asthma   . Asthma    Phreesia 02/27/2020  . Autoimmune hepatitis ([place])   . Fatigue   . Hypertension   . Hypertrophic cardiomyopathy ([place])   . Meningioma ([facility])    s/p craniotomy and removal from right medial sphenoid wing extending into cavernous sinus  . Migraine, ophthalmoplegic   . Osteopenia    Past Surgical History:  Procedure Laterality Date  . ANKLE SURGERY    . BRAIN SURGERY    . CHOLECYSTECTOMY    . FRACTURE SURGERY N/A    Phreesia 02/27/2020  . IR RADIOLOGIST EVAL & MGMT  08/26/2016  . IR VERTEBROPLASTY LUMBAR BX INC UNI/BIL INC/INJECT/IMAGING  07/30/2016  . WRIST SURGERY     Current Outpatient Medications on File Prior to Visit  Medication Sig Dispense Refill  . ALPRAZolam (XANAX) 0.25 MG tablet Take 2 tablets (0.5 mg total) by mouth 3 (three) times daily as needed for anxiety. 20 tablet 0  . azaTHIOprine (IMURAN) 50 MG tablet Take 100 mg by mouth daily. 2 pills once a day    . Calcium Carbonate-Vitamin D 600-400 MG-UNIT chew tablet Chew 1 tablet by mouth  2 (two) times daily.    Marland Kitchen conjugated estrogens (PREMARIN) vaginal cream 0.5 g of cream intravaginally administered daily for 2 weeks, then reduce to twice weekly dose. 42.5 g 12  . docusate sodium (COLACE) 100 MG capsule Take 100 mg by mouth daily.    Marland Kitchen doxycycline (VIBRA-TABS) 100 MG tablet Take 1 tablet (100 mg total) by mouth 2 (two) times daily. 14 tablet 0  . Fluticasone-Salmeterol (ADVAIR DISKUS) 100-50 MCG/DOSE AEPB USE 1 INHALATION BY MOUTH INTO THE LUNGS EVERY 12 HOURS 180 each 3  . ipratropium (ATROVENT) 0.06 % nasal spray USE ONE SPRAY NASALLY THREE TIMES DAILY 45 mL 3  . levocetirizine (XYZAL) 5 MG tablet Take 1 tablet (5 mg total) by mouth every evening. 30 tablet 11  . meloxicam (MOBIC) 15 MG tablet TAKE 1 TABLET(15 MG) BY MOUTH DAILY 90 tablet 2  . mometasone (ELOCON) 0.1 % cream Apply 1 application topically daily. (Patient taking differently: Apply 1 application topically daily as needed (skin irritations).) 45 g 0  . montelukast (SINGULAIR) 10 MG tablet TAKE 1 TABLET BY MOUTH AT BEDTIME 90 tablet 3  . nitrofurantoin, macrocrystal-monohydrate, (MACROBID) 100 MG capsule Take 1 capsule (100 mg total) by mouth 2 (two) times daily. 14 capsule 0  . rosuvastatin (CRESTOR) 20 MG tablet Take 1 tablet (20 mg total) by mouth daily. 90 tablet  1  . valsartan (DIOVAN) 80 MG tablet Take 1 tablet (80 mg total) by mouth daily. 90 tablet 3  . verapamil (CALAN-SR) 240 MG CR tablet Take 1 tablet (240 mg total) by mouth daily. 90 tablet 3   No current facility-administered medications on file prior to visit.   Allergies  Allergen Reactions  . Penicillins Anaphylaxis  . Allegra [Fexofenadine Hydrochloride]     palpitations  . Desloratadine     Palpitations   . Cephalexin Hives and Rash   Social History   Socioeconomic History  . Marital status: Married    Spouse name: Not on file  . Number of children: Not on file  . Years of education: Not on file  . Highest education level: Not on  file  Occupational History  . Not on file  Tobacco Use  . Smoking status: Never Smoker  . Smokeless tobacco: Never Used  Substance and Sexual Activity  . Alcohol use: No  . Drug use: No  . Sexual activity: Not on file  Other Topics Concern  . Not on file  Social History Narrative  . Not on file   Social Determinants of Health   Financial Resource Strain: Low Risk   . Difficulty of Paying Living Expenses: Not very hard  Food Insecurity: Not on file  Transportation Needs: Not on file  Physical Activity: Not on file  Stress: Not on file  Social Connections: Not on file  Intimate Partner Violence: Not on file     Review of Systems  All other systems reviewed and are negative.      Objective:   Physical Exam Vitals reviewed.  Constitutional:      General: She is not in acute distress.    Appearance: She is well-developed. She is not diaphoretic.  HENT:     Head: Normocephalic and atraumatic.     Nose: Nose normal.     Mouth/Throat:     Pharynx: No oropharyngeal exudate.  Eyes:     Conjunctiva/sclera: Conjunctivae normal.  Cardiovascular:     Rate and Rhythm: Normal rate and regular rhythm.     Heart sounds: Normal heart sounds. No murmur heard.   Pulmonary:     Effort: Pulmonary effort is normal. No respiratory distress.     Breath sounds: Normal breath sounds. No wheezing or rales.  Abdominal:     General: Bowel sounds are normal. There is no distension.     Palpations: Abdomen is soft.     Tenderness: There is no abdominal tenderness. There is no guarding or rebound.  Musculoskeletal:     Cervical back: Neck supple.  Lymphadenopathy:     Cervical: No cervical adenopathy.  Neurological:     Mental Status: She is alert and oriented to person, place, and time.     Cranial Nerves: No cranial nerve deficit.     Motor: No abnormal muscle tone.     Coordination: Coordination normal.     Deep Tendon Reflexes: Reflexes are normal and symmetric.  Psychiatric:         Behavior: Behavior normal.        Thought Content: Thought content normal.        Judgment: Judgment normal.           Assessment & Plan:  Primary hypertension - Plan: CBC with Differential/Platelet, COMPLETE METABOLIC PANEL WITH GFR, Lipid panel  Pure hypercholesterolemia - Plan: CBC with Differential/Platelet, COMPLETE METABOLIC PANEL WITH GFR, Lipid panel  HOCM (hypertrophic obstructive cardiomyopathy) ([place])  Blood pressure today is well controlled, check CBC, CMP, lipid panel.  Goal LDL cholesterol is less than 130.  Monitor her liver function test given her history of autoimmune hepatitis.  Patient is otherwise asymptomatic and doing well.

## 2020-08-08 ENCOUNTER — Encounter: Payer: Self-pay | Admitting: *Deleted

## 2020-08-09 ENCOUNTER — Telehealth (INDEPENDENT_AMBULATORY_CARE_PROVIDER_SITE_OTHER): Payer: Medicare Other | Admitting: Nurse Practitioner

## 2020-08-09 ENCOUNTER — Other Ambulatory Visit: Payer: Self-pay

## 2020-08-09 ENCOUNTER — Encounter: Payer: Self-pay | Admitting: Nurse Practitioner

## 2020-08-09 DIAGNOSIS — Z0000 Encounter for general adult medical examination without abnormal findings: Secondary | ICD-10-CM | POA: Diagnosis not present

## 2020-08-09 NOTE — Progress Notes (Signed)
Patient: Veronica Flores, Female    DOB: 05-14-1945, 75 y.o.   MRN: [id number]  Visit Date: 08/09/2020  Today's Provider: Eulogio Bear, NP   Chief Complaint  Patient presents with  . Medicare Wellness    No medical concerns, takes care of husband whom has [place]. Causes some anxiety, however, Does not like to take meds for that due to low tolerance of med. Has been discuss with pcp Screening completed. Will bring copy of living will    Subjective:   KEMA SANTAELLA is a 75 y.o. female who presents today for her Subsequent Annual Wellness Visit.  List of Doctors: PCP - Dr. Jenna Luo GI - Dr. Richmond Campbell Neurosurgeon - Dr. Harrison Mons    HPI   HTN/HLD - Taking verapamil 240 mg, valsartan 80 mg and rosuvastatin 20 mg daily and tolerating well.  Osteopenia - DEXA up to date.  Currently taking Vitamin D/Calcium  Allergies - taking montelukast 10 mg daily and as needed levocetirizine 5 mg daily with Atrovent nasal spray.  Review of Systems  Constitutional: Negative.   Respiratory: Negative.   Cardiovascular: Negative.   Gastrointestinal: Negative.   Skin: Negative.   Neurological: Negative.   Psychiatric/Behavioral: Negative.     Past Medical History:  Diagnosis Date  . Allergy   . Asthma   . Asthma    Phreesia 02/27/2020  . Autoimmune hepatitis ([facility])   . Fatigue   . Hypertension   . Hypertrophic cardiomyopathy ([place])   . Meningioma ([facility])    s/p craniotomy and removal from right medial sphenoid wing extending into cavernous sinus  . Migraine, ophthalmoplegic   . Osteopenia     Past Surgical History:  Procedure Laterality Date  . ANKLE SURGERY    . BRAIN SURGERY    . CHOLECYSTECTOMY    . FRACTURE SURGERY N/A    Phreesia 02/27/2020  . IR RADIOLOGIST EVAL & MGMT  08/26/2016  . IR VERTEBROPLASTY LUMBAR BX INC UNI/BIL INC/INJECT/IMAGING  07/30/2016  . WRIST SURGERY      No family history on file.  Social History   Socioeconomic History  .  Marital status: Married    Spouse name: Not on file  . Number of children: Not on file  . Years of education: Not on file  . Highest education level: Not on file  Occupational History  . Not on file  Tobacco Use  . Smoking status: Never Smoker  . Smokeless tobacco: Never Used  Substance and Sexual Activity  . Alcohol use: No  . Drug use: No  . Sexual activity: Not on file  Other Topics Concern  . Not on file  Social History Narrative  . Not on file   Social Determinants of Health   Financial Resource Strain: Low Risk   . Difficulty of Paying Living Expenses: Not very hard  Food Insecurity: Not on file  Transportation Needs: Not on file  Physical Activity: Not on file  Stress: Not on file  Social Connections: Not on file  Intimate Partner Violence: Not on file    Outpatient Encounter Medications as of 08/09/2020  Medication Sig  . aspirin EC 81 MG tablet Take 81 mg by mouth daily. Swallow whole.  . azaTHIOprine (IMURAN) 50 MG tablet Take 100 mg by mouth daily. 2 pills once a day  . Calcium Carbonate-Vitamin D 600-400 MG-UNIT chew tablet Chew 1 tablet by mouth 2 (two) times daily.  Marland Kitchen docusate sodium (COLACE) 100 MG capsule Take 100 mg by mouth  daily.  . Fluticasone-Salmeterol (ADVAIR DISKUS) 100-50 MCG/DOSE AEPB USE 1 INHALATION BY MOUTH INTO THE LUNGS EVERY 12 HOURS  . ipratropium (ATROVENT) 0.06 % nasal spray USE ONE SPRAY NASALLY THREE TIMES DAILY  . levocetirizine (XYZAL) 5 MG tablet Take 1 tablet (5 mg total) by mouth every evening.  . meloxicam (MOBIC) 15 MG tablet TAKE 1 TABLET(15 MG) BY MOUTH DAILY  . mometasone (ELOCON) 0.1 % cream Apply 1 application topically daily. (Patient taking differently: Apply 1 application topically daily as needed (skin irritations).)  . montelukast (SINGULAIR) 10 MG tablet TAKE 1 TABLET BY MOUTH AT BEDTIME  . rosuvastatin (CRESTOR) 20 MG tablet Take 1 tablet (20 mg total) by mouth daily.  . valsartan (DIOVAN) 80 MG tablet Take 1 tablet  (80 mg total) by mouth daily.  . verapamil (CALAN-SR) 240 MG CR tablet Take 1 tablet (240 mg total) by mouth daily.   No facility-administered encounter medications on file as of 08/09/2020.    Functional Status Survey: Is the patient deaf or have difficulty hearing?: No Does the patient have difficulty seeing, even when wearing glasses/contacts?: No Does the patient have difficulty concentrating, remembering, or making decisions?: No Does the patient have difficulty walking or climbing stairs?: No Does the patient have difficulty dressing or bathing?: No Does the patient have difficulty doing errands alone such as visiting a doctor's office or shopping?: No  Fall Risk Assessment Fall Risk  08/09/2020 08/06/2020 11/19/2018 07/13/2017 12/01/2016  Falls in the past year? 1 0 1 Yes Yes  Comment - - Emmi Telephone Survey: data to providers prior to load - -  Number falls in past yr: 0 - 1 2 or more 1  Comment - - Emmi Telephone Survey Actual Response = 1 - -  Injury with Fall? 0 - 0 Yes Yes  Risk for fall due to : - No Fall Risks - - History of fall(s)  Follow up - Falls evaluation completed - Falls evaluation completed Falls evaluation completed    Depression Screen Depression screen [facility] 2/9 08/09/2020 08/06/2020 07/13/2017 12/01/2016 12/15/2014  Decreased Interest 0 0 0 0 0  Down, Depressed, Hopeless 0 0 0 0 0  PHQ - 2 Score 0 0 0 0 0   6CIT Screen 08/09/2020  What Year? 0 points  What month? 0 points  What time? 0 points  Count back from 20 0 points  Months in reverse 0 points  Repeat phrase 0 points  Total Score 0   Advanced Directives Does patient have a HCPOA?    yes  If yes, name and contact information: Daughter - Amy   Does patient have a living will or MOST form?  yes; will bring copy in to office to have scanned  Objective:   Vitals: There were no vitals taken for this visit. There is no height or weight on file to calculate BMI. No exam data present  Physical  Exam Neurological:     Mental Status: She is alert and oriented to person, place, and time.  Psychiatric:        Mood and Affect: Mood normal.        Behavior: Behavior normal.        Thought Content: Thought content normal.        Judgment: Judgment normal.      Assessment & Plan:     Annual Wellness Visit  Reviewed patient's Family Medical History Reviewed and updated list of patient's medical providers Assessment of cognitive impairment was done Assessed  patient's functional ability Established a written schedule for health screening [place] Completed and Reviewed   Immunization History  Administered Date(s) Administered  . Fluad Quad(high Dose 65+) 01/11/2019, 02/06/2020  . Hepatitis A 12/19/2003, 10/13/2006  . Hepatitis B 12/19/2003, 10/13/2006  . Influenza Whole 01/02/2010  . Influenza, High Dose Seasonal PF 02/05/2017, 01/14/2018  . Influenza,inj,Quad PF,6+ Mos 01/27/2013, 02/09/2014, 02/08/2015, 02/07/2016  . PFIZER(Purple Top)SARS-COV-2 Vaccination 05/28/2019, 06/21/2019, 01/03/2020  . Pneumococcal Conjugate-13 05/27/2013  . Pneumococcal Polysaccharide-23 01/20/2004, 09/02/2010, 01/14/2018  . Td 04/22/2003  . Tdap 03/18/2011    Health Maintenance  Topic Date Due  . Hepatitis C Screening  08/09/2021 (Originally 11/24/1945)  . INFLUENZA VACCINE  11/19/2020  . TETANUS/TDAP  03/17/2021  . MAMMOGRAM  12/27/2021  . COLONOSCOPY (Pts 45-18yrs Insurance coverage will need to be confirmed)  03/01/2030  . DEXA SCAN  Completed  . COVID-19 Vaccine  Completed  . PNA vac Low Risk Adult  Completed  . HPV VACCINES  Aged Out    Discussed health benefits of physical activity, and encouraged her to engage in regular exercise appropriate for her age and condition.   No orders of the defined types were placed in this encounter.   Current Outpatient Medications:  .  aspirin EC 81 MG tablet, Take 81 mg by mouth daily. Swallow whole., Disp: , Rfl:  .   azaTHIOprine (IMURAN) 50 MG tablet, Take 100 mg by mouth daily. 2 pills once a day, Disp: , Rfl:  .  Calcium Carbonate-Vitamin D 600-400 MG-UNIT chew tablet, Chew 1 tablet by mouth 2 (two) times daily., Disp: , Rfl:  .  docusate sodium (COLACE) 100 MG capsule, Take 100 mg by mouth daily., Disp: , Rfl:  .  Fluticasone-Salmeterol (ADVAIR DISKUS) 100-50 MCG/DOSE AEPB, USE 1 INHALATION BY MOUTH INTO THE LUNGS EVERY 12 HOURS, Disp: 180 each, Rfl: 3 .  ipratropium (ATROVENT) 0.06 % nasal spray, USE ONE SPRAY NASALLY THREE TIMES DAILY, Disp: 45 mL, Rfl: 3 .  levocetirizine (XYZAL) 5 MG tablet, Take 1 tablet (5 mg total) by mouth every evening., Disp: 30 tablet, Rfl: 11 .  meloxicam (MOBIC) 15 MG tablet, TAKE 1 TABLET(15 MG) BY MOUTH DAILY, Disp: 90 tablet, Rfl: 2 .  mometasone (ELOCON) 0.1 % cream, Apply 1 application topically daily. (Patient taking differently: Apply 1 application topically daily as needed (skin irritations).), Disp: 45 g, Rfl: 0 .  montelukast (SINGULAIR) 10 MG tablet, TAKE 1 TABLET BY MOUTH AT BEDTIME, Disp: 90 tablet, Rfl: 3 .  rosuvastatin (CRESTOR) 20 MG tablet, Take 1 tablet (20 mg total) by mouth daily., Disp: 90 tablet, Rfl: 1 .  valsartan (DIOVAN) 80 MG tablet, Take 1 tablet (80 mg total) by mouth daily., Disp: 90 tablet, Rfl: 3 .  verapamil (CALAN-SR) 240 MG CR tablet, Take 1 tablet (240 mg total) by mouth daily., Disp: 90 tablet, Rfl: 3 There are no discontinued medications.  Next Medicare Wellness Visit in 12+ months  This visit was completed via telephone due to the restrictions of the COVID-19 pandemic. All issues as above were discussed and addressed but no physical exam was performed. If it was felt that the patient should be evaluated in the office, they were directed there. The patient verbally consented to this visit. Patient was unable to complete an audio/visual visit due to Lack of equipment. . Location of the patient: home . Location of the provider: work . Those  involved with this call:  . Provider: Noemi Chapel, DNP, FNP-C . CMA:  Annabelle Harman, CMA . Front Desk/Registration: Vevelyn Pat  . Time spent on call: 13 minutes on the phone discussing health concerns. 20 minutes total spent in review of patient's record and preparation of their chart.  I verified patient identity using two factors (patient name and date of birth). Patient consents verbally to being seen via telemedicine visit today.

## 2020-08-16 DIAGNOSIS — Z23 Encounter for immunization: Secondary | ICD-10-CM | POA: Diagnosis not present

## 2020-08-22 NOTE — Progress Notes (Signed)
Chronic Care Management Pharmacy Note  08/31/2020 Name:  Veronica Flores MRN:  [id number] DOB:  1945-04-24  Subjective: Veronica Flores is an 75 y.o. year old female who is a primary patient of Pickard, Cammie Mcgee, MD.  The CCM team was consulted for assistance with disease management and care coordination needs.    Engaged with patient by telephone for follow up visit in response to provider referral for pharmacy case management and/or care coordination services.   Consent to Services:  The patient was given the following information about Chronic Care Management services today, agreed to services, and gave verbal consent: 1. CCM service includes personalized support from designated clinical staff supervised by the primary care provider, including individualized plan of care and coordination with other care providers 2. 24/7 contact phone numbers for assistance for urgent and routine care needs. 3. Service will only be billed when office clinical staff spend 20 minutes or more in a month to coordinate care. 4. Only one practitioner may furnish and bill the service in a calendar month. 5.The patient may stop CCM services at any time (effective at the end of the month) by phone call to the office staff. 6. The patient will be responsible for cost sharing (co-pay) of up to 20% of the service fee (after annual deductible is met). Patient agreed to services and consent obtained.  Patient Care Team: Susy Frizzle, MD as PCP - General (Family Medicine) Edythe Clarity, [facility] as Pharmacist (Pharmacist)  Recent office visits: 08/09/20 Edsel Petrin, AWV) - no changes at this visit  08/06/20 (Pickard) - BP controlled no medication changes Recent consult visits: None recent  Hospital visits: None in previous 6 months  Objective:  Lab Results  Component Value Date   CREATININE 0.88 08/06/2020   BUN 17 08/06/2020   GFRNONAA 65 08/06/2020   GFRAA 75 08/06/2020   NA 139 08/06/2020   K 4.5  08/06/2020   CALCIUM 9.7 08/06/2020   CO2 24 08/06/2020   GLUCOSE 104 (H) 08/06/2020    Lab Results  Component Value Date/Time   HGBA1C 5.4 05/27/2013 08:54 AM   HGBA1C 5.5 11/18/2011 06:04 PM    Last diabetic Eye exam: No results found for: HMDIABEYEEXA  Last diabetic Foot exam: No results found for: HMDIABFOOTEX   Lab Results  Component Value Date   CHOL 98 08/06/2020   HDL 34 (L) 08/06/2020   LDLCALC 44 08/06/2020   TRIG 123 08/06/2020   CHOLHDL 2.9 08/06/2020    Hepatic Function Latest Ref Rng & Units 08/06/2020 07/22/2018 01/14/2018  Total Protein 6.1 - 8.1 g/dL 7.1 7.0 6.7  Albumin 3.6 - 5.1 g/dL - - -  AST 10 - 35 U/L _0 ALT 6 - 29 U/L _1 Alk Phosphatase 33 - 130 U/L - - -  Total Bilirubin 0.2 - 1.2 mg/dL 0.6 0.6 0.8    Lab Results  Component Value Date/Time   TSH 1.25 05/29/2015 08:16 AM   TSH 3.344 11/17/2010 11:03 PM    CBC Latest Ref Rng & Units 08/06/2020 07/22/2018 01/14/2018  WBC 3.8 - 10.8 Thousand/uL 5.4 4.8 4.2  Hemoglobin 11.7 - 15.5 g/dL 15.0 15.0 15.3  Hematocrit 35.0 - 45.0 % 47.1(H) 44.3 46.4(H)  Platelets 140 - 400 Thousand/uL 320 314 332    No results found for: VD25OH  Clinical ASCVD: No  The ASCVD Risk score Mikey Bussing DC Jr., et al., 2013) failed to calculate for the following reasons:   The  valid total cholesterol range is 130 to 320 mg/dL    Depression screen [facility] 2/9 08/09/2020 08/06/2020 07/13/2017  Decreased Interest 0 0 0  Down, Depressed, Hopeless 0 0 0  PHQ - 2 Score 0 0 0       Social History   Tobacco Use  Smoking Status Never Smoker  Smokeless Tobacco Never Used   BP Readings from Last 3 Encounters:  08/06/20 140/82  02/06/20 (!) 156/76  12/05/19 120/72   Pulse Readings from Last 3 Encounters:  08/06/20 70  02/06/20 62  12/05/19 (!) 58   Wt Readings from Last 3 Encounters:  08/06/20 183 lb (83 kg)  02/06/20 183 lb 3.2 oz (83.1 kg)  12/05/19 183 lb 3.2 oz (83.1 kg)   BMI Readings from Last 3 Encounters:   08/06/20 28.66 kg/m  02/06/20 28.69 kg/m  12/05/19 28.27 kg/m    Assessment/Interventions: Review of patient past medical history, allergies, medications, health status, including review of consultants reports, laboratory and other test data, was performed as part of comprehensive evaluation and provision of chronic care management services.   SDOH:  (Social Determinants of Health) assessments and interventions performed: Yes   Financial Resource Strain: Low Risk   . Difficulty of Paying Living Expenses: Not very hard    SDOH Screenings   Alcohol Screen: Low Risk   . Last Alcohol Screening Score (AUDIT): 0  Depression (PHQ2-9): Low Risk   . PHQ-2 Score: 0  Financial Resource Strain: Low Risk   . Difficulty of Paying Living Expenses: Not very hard  Food Insecurity: Not on file  Housing: Not on file  Physical Activity: Not on file  Social Connections: Not on file  Stress: Not on file  Tobacco Use: Low Risk   . Smoking Tobacco Use: Never Smoker  . Smokeless Tobacco Use: Never Used  Transportation Needs: Not on file    CCM Care Plan  Allergies  Allergen Reactions  . Penicillins Anaphylaxis  . Allegra [Fexofenadine Hydrochloride]     palpitations  . Desloratadine     Palpitations   . Cephalexin Hives and Rash    Medications Reviewed Today    Reviewed by Edythe Clarity, [facility] (Pharmacist) on 08/31/20 at 1027  Med List Status: <None>  Medication Order Taking? Sig Documenting Provider Last Dose Status Informant  aspirin EC 81 MG tablet [id number] Yes Take 81 mg by mouth daily. Swallow whole. [provider] Taking Active   azaTHIOprine (IMURAN) 50 MG tablet [id number] Yes Take 100 mg by mouth daily. 2 pills once a day [provider] Taking Active Self  Calcium Carbonate-Vitamin D 600-400 MG-UNIT chew tablet [id number] Yes Chew 1 tablet by mouth 2 (two) times daily. [provider] Taking Active Self  docusate sodium (COLACE) 100 MG capsule  [id number] Yes Take 100 mg by mouth daily. [provider] Taking Active Self  Fluticasone-Salmeterol (ADVAIR DISKUS) 100-50 MCG/DOSE AEPB [id number] Yes USE 1 INHALATION BY MOUTH INTO THE LUNGS EVERY 12 HOURS Pickard, Cammie Mcgee, MD Taking Active   ipratropium (ATROVENT) 0.06 % nasal spray [id number] Yes USE ONE SPRAY NASALLY THREE TIMES DAILY Susy Frizzle, MD Taking Active   levocetirizine (XYZAL) 5 MG tablet [id number] Yes Take 1 tablet (5 mg total) by mouth every evening. Susy Frizzle, MD Taking Active   meloxicam (MOBIC) 15 MG tablet [id number] Yes TAKE 1 TABLET(15 MG) BY MOUTH DAILY Susy Frizzle, MD Taking Active   mometasone (ELOCON) 0.1 % cream [id number] Yes Apply 1 application topically daily.  Patient taking differently: Apply 1 application topically daily as needed (skin irritations).   Susy Frizzle, MD Taking Active   montelukast (SINGULAIR) 10 MG tablet [id number] Yes TAKE 1 TABLET BY MOUTH AT BEDTIME Susy Frizzle, MD Taking Active   rosuvastatin (CRESTOR) 20 MG tablet [id number] Yes Take 1 tablet (20 mg total) by mouth daily. Susy Frizzle, MD Taking Active   valsartan (DIOVAN) 80 MG tablet [id number] Yes Take 1 tablet (80 mg total) by mouth daily. Susy Frizzle, MD Taking Active   verapamil (CALAN-SR) 240 MG CR tablet [id number] Yes Take 1 tablet (240 mg total) by mouth daily. Susy Frizzle, MD Taking Active           Patient Active Problem List   Diagnosis Date Noted  . Hypertension 07/06/2010  . Seasonal allergies 07/06/2010  . Fatigue 07/06/2010  . Asthma 07/06/2010  . Autoimmune hepatitis ([place]) 07/06/2010  . 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 Meningioma ([place]) 07/06/2010  . S/P cholecystectomy 07/06/2010  . Osteopenia 07/06/2010  . 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 Fluad Quad(high Dose 65+) 01/11/2019, 02/06/2020  . Hepatitis A 12/19/2003, 10/13/2006  .  Hepatitis B 12/19/2003, 10/13/2006  . Influenza Whole 01/02/2010  . Influenza, High Dose Seasonal PF 02/05/2017, 01/14/2018  . Influenza,inj,Quad PF,6+ Mos 01/27/2013, 02/09/2014, 02/08/2015, 02/07/2016  . PFIZER(Purple Top)SARS-COV-2 Vaccination 05/28/2019, 06/21/2019, 01/03/2020  . Pneumococcal Conjugate-13 05/27/2013  . Pneumococcal Polysaccharide-23 01/20/2004, 09/02/2010, 01/14/2018  . Td 04/22/2003  . Tdap 03/18/2011    Conditions to be addressed/monitored:  HTN, Asthma, Osteopenia  Care Plan : General Pharmacy (Adult)  Updates made by Edythe Clarity, RPH since 08/31/2020 12:00 AM    Problem: HTN, Asthma, Osteopenia   Priority: High  Onset Date: 03/03/2021    Long-Range Goal: Patient-Specific Goal   Start Date: 08/29/2020  Expected End Date: 03/03/2021  This Visit's Progress: On track  Priority: High  Note:   Current Barriers:  . none identified at this time  Pharmacist Clinical Goal(s):  Marland Kitchen Patient will maintain control of blood pressure as evidenced by home monitoring  . adhere to prescribed medication regimen as evidenced by filld ates . contact provider office for questions/concerns as evidenced notation of same in electronic health record through collaboration with PharmD and provider.   Interventions: . 1:1 collaboration with Susy Frizzle, MD regarding development and update of comprehensive plan of care as evidenced by provider attestation and co-signature . Inter-disciplinary care team collaboration (see longitudinal plan of care) . Comprehensive medication review performed; medication list updated in electronic medical record  Hypertension (BP goal <140/90) -Controlled -Current treatment: . Valsartan 64m daily . Verapamil 240 mg CR -Medications previously tried: none noted  -Current home readings: 122/73, 138/63, 132/76, 141/72, 116/64  -Denies hypotensive/hypertensive symptoms -Educated on BP goals and benefits of medications for prevention of  heart attack, stroke and kidney damage; Daily salt intake goal < 2300 mg; Exercise goal of 150 minutes per week; Importance of home blood pressure monitoring; -Counseled to monitor BP at home daily, document, and provide log at future appointments -Recommended to continue current medication, home BP excellent   Osteopenia (Goal Prevent fractures) -Controlled -Last DEXA Scan: 07/20/19  T-Score femoral neck: -1.3  -Patient is not a candidate for pharmacologic treatment -Current treatment  . Calcium 6069m+ vitamin D3 -Medications previously tried: none noted  -Recommend [phone] units of vitamin D daily. Recommend 1200 mg of calcium daily from dietary and supplemental sources.  Recommend weight-bearing and muscle strengthening exercises for building and maintaining bone density. -Recommended to continue current medication Recommended repeat DEXA 07/2021  Asthma (Goal: Control symptoms) -Controlled -Current treatment  . Advair 100-64mg q12h -Medications previously tried: none noted -Only using once daily still, reports breathing controlled -Denies having rescue inhaler or needing one  -Recommended to continue current medication   Patient Goals/Self-Care Activities . Patient will:  - take medications as prescribed focus on medication adherence by pill count check blood pressure daily, document, and provide at future appointments  Follow Up Plan: The care management team will reach out to the patient again over the next 120 days.         Medication Assistance: None required.  Patient affirms current coverage meets needs.  Patient's preferred pharmacy is:  [facility] [place]Phone: [phone]: [phone] [facility]([place] [address]Phone: [phone] Fax: [phone] [id number] [place] [address] [place] [place]Phone: [phone]: [phone] Uses pill box? No - organizes in jar Pt endorses 100% compliance  We discussed: Benefits of medication synchronization, packaging and delivery as well as enhanced pharmacist oversight with [employer]. Patient decided to: Continue current medication management strategy  Care Plan and Follow Up Patient Decision:  Patient agrees to Care Plan and Follow-up.  Plan: The care management team will reach out to the patient again over the next 120 days.  CBeverly Milch PharmD Clinical Pharmacist BDari[place]([phone]

## 2020-08-29 ENCOUNTER — Ambulatory Visit (INDEPENDENT_AMBULATORY_CARE_PROVIDER_SITE_OTHER): Payer: Medicare Other | Admitting: Pharmacist

## 2020-08-29 DIAGNOSIS — I10 Essential (primary) hypertension: Secondary | ICD-10-CM | POA: Diagnosis not present

## 2020-08-29 DIAGNOSIS — M8580 Other specified disorders of bone density and structure, unspecified site: Secondary | ICD-10-CM

## 2020-08-29 DIAGNOSIS — J45909 Unspecified asthma, uncomplicated: Secondary | ICD-10-CM

## 2020-08-31 ENCOUNTER — Encounter: Payer: Self-pay | Admitting: Family Medicine

## 2020-08-31 MED ORDER — FLUTICASONE-SALMETEROL 100-50 MCG/ACT IN AEPB: 1.0000 | INHALATION_SPRAY | Freq: Two times a day (BID) | RESPIRATORY_TRACT | 3 refills | Status: DC

## 2020-08-31 NOTE — Patient Instructions (Addendum)
Visit Information  Goals Addressed            This Visit's Progress   . Track and Manage My Blood Pressure-Hypertension       Timeframe:  Long-Range Goal Priority:  High Start Date:   08/29/20                          Expected End Date: 03/01/21                      Follow Up Date 11/29/20    - check blood pressure daily - choose a place to take my blood pressure (home, clinic or office, retail store) - write blood pressure results in a log or diary    Why is this important?    You won't feel high blood pressure, but it can still hurt your blood vessels.   High blood pressure can cause heart or kidney problems. It can also cause a stroke.   Making lifestyle changes like losing a little weight or eating less salt will help.   Checking your blood pressure at home and at different times of the day can help to control blood pressure.   If the doctor prescribes medicine remember to take it the way the doctor ordered.   Call the office if you cannot afford the medicine or if there are questions about it.     Notes:       Patient Care Plan: General Pharmacy (Adult)    Problem Identified: HTN, Asthma, Osteopenia   Priority: High  Onset Date: 03/03/2021    Long-Range Goal: Patient-Specific Goal   Start Date: 08/29/2020  Expected End Date: 03/03/2021  This Visit's Progress: On track  Priority: High  Note:   Current Barriers:  . none identified at this time  Pharmacist Clinical Goal(s):  Marland Kitchen Patient will maintain control of blood pressure as evidenced by home monitoring  . adhere to prescribed medication regimen as evidenced by filld ates . contact provider office for questions/concerns as evidenced notation of same in electronic health record through collaboration with PharmD and provider.   Interventions: . 1:1 collaboration with Susy Frizzle, MD regarding development and update of comprehensive plan of care as evidenced by provider attestation and  co-signature . Inter-disciplinary care team collaboration (see longitudinal plan of care) . Comprehensive medication review performed; medication list updated in electronic medical record  Hypertension (BP goal <140/90) -Controlled -Current treatment: . Valsartan 80mg  daily . Verapamil 240 mg CR -Medications previously tried: none noted  -Current home readings: 122/73, 138/63, 132/76, 141/72, 116/64  -Denies hypotensive/hypertensive symptoms -Educated on BP goals and benefits of medications for prevention of heart attack, stroke and kidney damage; Daily salt intake goal < 2300 mg; Exercise goal of 150 minutes per week; Importance of home blood pressure monitoring; -Counseled to monitor BP at home daily, document, and provide log at future appointments -Recommended to continue current medication, home BP excellent   Osteopenia (Goal Prevent fractures) -Controlled -Last DEXA Scan: 07/20/19  T-Score femoral neck: -1.3  -Patient is not a candidate for pharmacologic treatment -Current treatment  . Calcium 600mg  + vitamin D3 -Medications previously tried: none noted  -Recommend [phone] units of vitamin D daily. Recommend 1200 mg of calcium daily from dietary and supplemental sources. Recommend weight-bearing and muscle strengthening exercises for building and maintaining bone density. -Recommended to continue current medication Recommended repeat DEXA 07/2021  Asthma (Goal: Control symptoms) -Controlled -Current treatment  .  Advair 100-10mcg q12h -Medications previously tried: none noted -Only using once daily still, reports breathing controlled -Denies having rescue inhaler or needing one  -Recommended to continue current medication   Patient Goals/Self-Care Activities . Patient will:  - take medications as prescribed focus on medication adherence by pill count check blood pressure daily, document, and provide at future appointments  Follow Up Plan: The care management  team will reach out to the patient again over the next 120 days.         The patient verbalized understanding of instructions, educational materials, and care plan provided today and agreed to receive a mailed copy of patient instructions, educational materials, and care plan.  Telephone follow up appointment with pharmacy team member scheduled for: 6 months  Veronica Flores, [facility]  Blood Pressure Record Sheet To take your blood pressure, you will need a blood pressure machine. You can buy a blood pressure machine (blood pressure monitor) at your clinic, drug store, or online. When choosing one, consider:  An automatic monitor that has an arm cuff.  A cuff that wraps snugly around your upper arm. You should be able to fit only one finger between your arm and the cuff.  A device that stores blood pressure reading results.  Do not choose a monitor that measures your blood pressure from your wrist or finger. Follow your health care provider's instructions for how to take your blood pressure. To use this form:  Get one reading in the morning (a.m.) before you take any medicines.  Get one reading in the evening (p.m.) before supper.  Take at least 2 readings with each blood pressure check. This makes sure the results are correct. Wait 1-2 minutes between measurements.  Write down the results in the spaces on this form.  Repeat this once a week, or as told by your health care provider.  Make a follow-up appointment with your health care provider to discuss the results. Blood pressure log Date: _______________________  a.m. _____________________(1st reading) _____________________(2nd reading)  p.m. _____________________(1st reading) _____________________(2nd reading) Date: _______________________  a.m. _____________________(1st reading) _____________________(2nd reading)  p.m. _____________________(1st reading) _____________________(2nd reading) Date:  _______________________  a.m. _____________________(1st reading) _____________________(2nd reading)  p.m. _____________________(1st reading) _____________________(2nd reading) Date: _______________________  a.m. _____________________(1st reading) _____________________(2nd reading)  p.m. _____________________(1st reading) _____________________(2nd reading) Date: _______________________  a.m. _____________________(1st reading) _____________________(2nd reading)  p.m. _____________________(1st reading) _____________________(2nd reading) This information is not intended to replace advice given to you by your health care provider. Make sure you discuss any questions you have with your health care provider. Document Revised: 07/27/2019 Document Reviewed: 07/27/2019 Elsevier Patient Education  2021 [employer].

## 2020-09-19 NOTE — Progress Notes (Deleted)
Chronic Care Management Pharmacy Note  09/19/2020 Name:  Veronica Flores MRN:  [id number] DOB:  [birth year]  Summary: ***  Recommendations/Changes made from today's visit: ***  Plan: ***  Subjective: Veronica Flores is an 75 y.o. year old female who is a primary patient of Pickard, Cammie Mcgee, MD.  The CCM team was consulted for assistance with disease management and care coordination needs.    Engaged with patient by telephone for follow up visit in response to provider referral for pharmacy case management and/or care coordination services.   Consent to Services:  The patient was given the following information about Chronic Care Management services today, agreed to services, and gave verbal consent: 1. CCM service includes personalized support from designated clinical staff supervised by the primary care provider, including individualized plan of care and coordination with other care providers 2. 24/7 contact phone numbers for assistance for urgent and routine care needs. 3. Service will only be billed when office clinical staff spend 20 minutes or more in a month to coordinate care. 4. Only one practitioner may furnish and bill the service in a calendar month. 5.The patient may stop CCM services at any time (effective at the end of the month) by phone call to the office staff. 6. The patient will be responsible for cost sharing (co-pay) of up to 20% of the service fee (after annual deductible is met). Patient agreed to services and consent obtained.  Patient Care Team: Susy Frizzle, MD as PCP - General (Family Medicine) Edythe Clarity, [facility] as Pharmacist (Pharmacist)  Recent office visits: ***  Recent consult visits: [facility] visits: {Hospital DC Yes/No:25215}   Objective:  Lab Results  Component Value Date   CREATININE 0.88 08/06/2020   BUN 17 08/06/2020   GFRNONAA 65 08/06/2020   GFRAA 75 08/06/2020   NA 139 08/06/2020   K 4.5 08/06/2020   CALCIUM 9.7  08/06/2020   CO2 24 08/06/2020   GLUCOSE 104 (H) 08/06/2020    Lab Results  Component Value Date/Time   HGBA1C 5.4 05/27/2013 08:54 AM   HGBA1C 5.5 11/18/2011 06:04 PM    Last diabetic Eye exam: No results found for: HMDIABEYEEXA  Last diabetic Foot exam: No results found for: HMDIABFOOTEX   Lab Results  Component Value Date   CHOL 98 08/06/2020   HDL 34 (L) 08/06/2020   LDLCALC 44 08/06/2020   TRIG 123 08/06/2020   CHOLHDL 2.9 08/06/2020    Hepatic Function Latest Ref Rng & Units 08/06/2020 07/22/2018 01/14/2018  Total Protein 6.1 - 8.1 g/dL 7.1 7.0 6.7  Albumin 3.6 - 5.1 g/dL - - -  AST 10 - 35 U/L _0 ALT 6 - 29 U/L _1 Alk Phosphatase 33 - 130 U/L - - -  Total Bilirubin 0.2 - 1.2 mg/dL 0.6 0.6 0.8    Lab Results  Component Value Date/Time   TSH 1.25 05/29/2015 08:16 AM   TSH 3.344 11/17/2010 11:03 PM    CBC Latest Ref Rng & Units 08/06/2020 07/22/2018 01/14/2018  WBC 3.8 - 10.8 Thousand/uL 5.4 4.8 4.2  Hemoglobin 11.7 - 15.5 g/dL 15.0 15.0 15.3  Hematocrit 35.0 - 45.0 % 47.1(H) 44.3 46.4(H)  Platelets 140 - 400 Thousand/uL 320 314 332    No results found for: VD25OH  Clinical ASCVD: {YES/NO:21197} The ASCVD Risk score Mikey Bussing DC Jr., et al., 2013) failed to calculate for the following reasons:   The valid total cholesterol range is 130  to 320 mg/dL    Depression screen [facility] 2/9 08/09/2020 08/06/2020 07/13/2017  Decreased Interest 0 0 0  Down, Depressed, Hopeless 0 0 0  PHQ - 2 Score 0 0 0     ***Other: (CHADS2VASc if Afib, MMRC or CAT for COPD, ACT, DEXA)  Social History   Tobacco Use  Smoking Status Never Smoker  Smokeless Tobacco Never Used   BP Readings from Last 3 Encounters:  08/06/20 140/82  02/06/20 (!) 156/76  12/05/19 120/72   Pulse Readings from Last 3 Encounters:  08/06/20 70  02/06/20 62  12/05/19 (!) 58   Wt Readings from Last 3 Encounters:  08/06/20 183 lb (83 kg)  02/06/20 183 lb 3.2 oz (83.1 kg)  12/05/19 183 lb 3.2 oz  (83.1 kg)   BMI Readings from Last 3 Encounters:  08/06/20 28.66 kg/m  02/06/20 28.69 kg/m  12/05/19 28.27 kg/m    Assessment/Interventions: Review of patient past medical history, allergies, medications, health status, including review of consultants reports, laboratory and other test data, was performed as part of comprehensive evaluation and provision of chronic care management services.   SDOH:  (Social Determinants of Health) assessments and interventions performed: {yes/no:20286}  SDOH Screenings   Alcohol Screen: Low Risk   . Last Alcohol Screening Score (AUDIT): 0  Depression (PHQ2-9): Low Risk   . PHQ-2 Score: 0  Financial Resource Strain: Low Risk   . Difficulty of Paying Living Expenses: Not very hard  Food Insecurity: Not on file  Housing: Not on file  Physical Activity: Not on file  Social Connections: Not on file  Stress: Not on file  Tobacco Use: Low Risk   . Smoking Tobacco Use: Never Smoker  . Smokeless Tobacco Use: Never Used  Transportation Needs: Not on file    CCM Care Plan  Allergies  Allergen Reactions  . Penicillins Anaphylaxis  . Allegra [Fexofenadine Hydrochloride]     palpitations  . Desloratadine     Palpitations   . Cephalexin Hives and Rash    Medications Reviewed Today    Reviewed by Edythe Clarity, [facility] (Pharmacist) on 08/31/20 at 1027  Med List Status: <None>  Medication Order Taking? Sig Documenting Provider Last Dose Status Informant  aspirin EC 81 MG tablet [id number] Yes Take 81 mg by mouth daily. Swallow whole. [provider] Taking Active   azaTHIOprine (IMURAN) 50 MG tablet [id number] Yes Take 100 mg by mouth daily. 2 pills once a day [provider] Taking Active Self  Calcium Carbonate-Vitamin D 600-400 MG-UNIT chew tablet [id number] Yes Chew 1 tablet by mouth 2 (two) times daily. [provider] Taking Active Self  docusate sodium (COLACE) 100 MG capsule [id number] Yes Take 100 mg by mouth daily.  [provider] Taking Active Self  Fluticasone-Salmeterol (ADVAIR DISKUS) 100-50 MCG/DOSE AEPB [id number] Yes USE 1 INHALATION BY MOUTH INTO THE LUNGS EVERY 12 HOURS Pickard, Cammie Mcgee, MD Taking Active   ipratropium (ATROVENT) 0.06 % nasal spray [id number] Yes USE ONE SPRAY NASALLY THREE TIMES DAILY Susy Frizzle, MD Taking Active   levocetirizine (XYZAL) 5 MG tablet [id number] Yes Take 1 tablet (5 mg total) by mouth every evening. Susy Frizzle, MD Taking Active   meloxicam (MOBIC) 15 MG tablet [id number] Yes TAKE 1 TABLET(15 MG) BY MOUTH DAILY Susy Frizzle, MD Taking Active   mometasone (ELOCON) 0.1 % cream [id number] Yes Apply 1 application topically daily.  Patient taking differently: Apply 1 application topically daily as needed (skin irritations).   Pickard,  Cammie Mcgee, MD Taking Active   montelukast (SINGULAIR) 10 MG tablet [id number] Yes TAKE 1 TABLET BY MOUTH AT BEDTIME Susy Frizzle, MD Taking Active   rosuvastatin (CRESTOR) 20 MG tablet [id number] Yes Take 1 tablet (20 mg total) by mouth daily. Susy Frizzle, MD Taking Active   valsartan (DIOVAN) 80 MG tablet [id number] Yes Take 1 tablet (80 mg total) by mouth daily. Susy Frizzle, MD Taking Active   verapamil (CALAN-SR) 240 MG CR tablet [id number] Yes Take 1 tablet (240 mg total) by mouth daily. Susy Frizzle, MD Taking Active           Patient Active Problem List   Diagnosis Date Noted  . Hypertension 07/06/2010  . Seasonal allergies 07/06/2010  . Fatigue 07/06/2010  . Asthma 07/06/2010  . Autoimmune hepatitis ([place]) 07/06/2010  . 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 Meningioma ([place]) 07/06/2010  . S/P cholecystectomy 07/06/2010  . Osteopenia 07/06/2010  . 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 Fluad Quad(high Dose 65+) 01/11/2019, 02/06/2020  . Hepatitis A 12/19/2003, 10/13/2006  . Hepatitis B 12/19/2003, 10/13/2006  .  Influenza Whole 01/02/2010  . Influenza, High Dose Seasonal PF 02/05/2017, 01/14/2018  . Influenza,inj,Quad PF,6+ Mos 01/27/2013, 02/09/2014, 02/08/2015, 02/07/2016  . PFIZER(Purple Top)SARS-COV-2 Vaccination 05/28/2019, 06/21/2019, 01/03/2020  . Pneumococcal Conjugate-13 05/27/2013  . Pneumococcal Polysaccharide-23 01/20/2004, 09/02/2010, 01/14/2018  . Td 04/22/2003  . Tdap 03/18/2011    Conditions to be addressed/monitored:  {USCCMDZASSESSMENTOPTIONS:23563}  There are no care plans that you recently modified to display for this patient.    Medication Assistance: {MEDASSISTANCEINFO:25044}  Compliance/Adherence/Medication fill history: Care Gaps: ***  Star-Rating Drugs: ***  Patient's preferred pharmacy is:  [facility] ([facility]) [place], [place] [address] [place] [address] Phone: [phone] Fax: [phone]  [facility] ([place] [place], [place] [address] Phone: [phone] Fax: [phone]  [id number] - [place], [place] - [place] [address] [place] [address] [place] [address] Phone: [phone] Fax: [phone]  Uses pill box? {Yes or If no, why not?:20788} Pt endorses ***% compliance  We discussed: {Pharmacy options:24294} Patient decided to: {US Pharmacy Plan:23885}  Care Plan and Follow Up Patient Decision:  {FOLLOWUP:24991}  Plan: {CM FOLLOW UP OZYY:48250}  *** Current Barriers:  . {pharmacybarriers:24917}  Pharmacist Clinical Goal(s):  Marland Kitchen Patient will {PHARMACYGOALCHOICES:24921} through collaboration with PharmD and provider.   Interventions: . 1:1 collaboration with Susy Frizzle, MD regarding development and update of comprehensive plan of care as evidenced by provider attestation and co-signature . Inter-disciplinary care  team collaboration (see longitudinal plan of care) . Comprehensive medication review performed; medication list updated in electronic medical record  {CCM PHARMD DISEASE STATES:25130}  Patient Goals/Self-Care Activities . Patient will:  - {pharmacypatientgoals:24919}  Follow Up Plan: {CM FOLLOW UP IBBC:48889}

## 2020-09-21 ENCOUNTER — Telehealth: Payer: Self-pay

## 2020-10-03 ENCOUNTER — Ambulatory Visit: Payer: Medicare Other

## 2020-10-03 ENCOUNTER — Other Ambulatory Visit: Payer: Self-pay

## 2020-10-03 ENCOUNTER — Ambulatory Visit (INDEPENDENT_AMBULATORY_CARE_PROVIDER_SITE_OTHER): Payer: Medicare Other

## 2020-10-03 ENCOUNTER — Ambulatory Visit
Admission: RE | Admit: 2020-10-03 | Discharge: 2020-10-03 | Disposition: A | Discharge status: Home / Self Care | Payer: Medicare Other | Source: Ambulatory Visit | Attending: Family Medicine | Admitting: Family Medicine

## 2020-10-03 VITALS — BP 146/78 | HR 77 | Temp 98.0°F | Resp 17

## 2020-10-03 DIAGNOSIS — M545 Low back pain, unspecified: Secondary | ICD-10-CM

## 2020-10-03 DIAGNOSIS — M25561 Pain in right knee: Secondary | ICD-10-CM | POA: Diagnosis not present

## 2020-10-03 MED ORDER — PREDNISONE 20 MG PO TABS: 40.0000 mg | ORAL_TABLET | Freq: Every day | ORAL | 0 refills | Status: DC

## 2020-10-03 NOTE — ED Triage Notes (Signed)
RT knee and LT hip pain after pt tripped and fell on 09/26/20.

## 2020-10-06 NOTE — ED Provider Notes (Signed)
[place]   [id number] 10/03/20 Arrival Time: 3846  ASSESSMENT & PLAN:  1. Acute pain of right knee   2. Acute left-sided low back pain without sciatica     I have personally viewed the imaging studies ordered this visit. No knee fractures appreciated.  Begin trial of: Meds ordered this encounter  Medications   predniSONE (DELTASONE) 20 MG tablet    Sig: Take 2 tablets (40 mg total) by mouth daily.    Dispense:  10 tablet    Refill:  0  WBAT.  Recommend:  Follow-up Information     Schedule an appointment as soon as possible for a visit  with Gaynelle Arabian, MD.   Specialty: Orthopedic Surgery Contact information: [address] [place] [address] [place] [address] [phone]                Reviewed expectations re: course of current medical issues. Questions answered. Outlined signs and symptoms indicating need for more acute intervention. Patient verbalized understanding. After Visit Summary given.  SUBJECTIVE: History from: patient. Veronica Flores is a 75 y.o. female who reports fairly persistent mild to moderate pain of her right knee; described as aching; without radiation. Onset: abrupt. First noted:  09/26/2020 after fall onto knee . Symptoms have progressed to a point and plateaued since beginning. Aggravating factors: certain movements and weight bearing. Alleviating factors: have not been identified. Associated symptoms: none reported. Extremity sensation changes or weakness: none. Self treatment: has not tried OTC therapies.  History of similar: no. Some associated L lower back pain.  Past Surgical History:  Procedure Laterality Date   ANKLE SURGERY     BRAIN SURGERY     CHOLECYSTECTOMY     FRACTURE SURGERY N/A    Phreesia 02/27/2020   IR RADIOLOGIST EVAL & MGMT  08/26/2016   IR VERTEBROPLASTY LUMBAR BX INC UNI/BIL INC/INJECT/IMAGING  07/30/2016   WRIST SURGERY        OBJECTIVE:  Vitals:   10/03/20 1713  BP: (!) 146/78   Pulse: 77  Resp: 17  Temp: 98 F (36.7 C)  TempSrc: Oral  SpO2: 97%    General appearance: alert; no distress HEENT: [place]; AT Neck: supple with FROM Resp: unlabored respirations Back: some TTP over L lumbar paraspinal musculature; no midline TTP Extremities: RLE: warm with well perfused appearance; poorly localized mild to moderate tenderness over right lateral knee mainly; without gross deformities; swelling: moderate; bruising: none; knee ROM: normal, with discomfort CV: brisk extremity capillary refill of RLE; 2+ DP pulse of RLE. Skin: warm and dry; no visible rashes Neurologic: normal sensation and strength of RLE Psychological: alert and cooperative; normal mood and affect  Imaging: DG Knee Complete 4 Views Right  Result Date: 10/03/2020 CLINICAL DATA:  Right knee pain EXAM: RIGHT KNEE - COMPLETE 4+ VIEW COMPARISON:  None. FINDINGS: No acute fracture or dislocation. No aggressive osseous lesion. Normal alignment. Mild medial and lateral femorotibial compartment joint space narrowing. Soft tissue are unremarkable. No radiopaque foreign body or soft tissue emphysema. IMPRESSION: No acute osseous injury of the right knee. Electronically Signed   By: Kathreen Devoid   On: 10/03/2020 18:45     Allergies  Allergen Reactions   Penicillins Anaphylaxis   Allegra [Fexofenadine Hydrochloride]     palpitations   Desloratadine     Palpitations    Cephalexin Hives and Rash    Past Medical History:  Diagnosis Date   Allergy    Asthma    Asthma  Phreesia 02/27/2020   Autoimmune hepatitis ([place])    Fatigue    Hypertension    Hypertrophic cardiomyopathy (HCC)    Meningioma (HCC)    s/p craniotomy and removal from right medial sphenoid wing extending into cavernous sinus   Migraine, ophthalmoplegic    Osteopenia    Social History   Socioeconomic History   Marital status: Married    Spouse name: Not on file   Number of children: Not on file   Years of education: Not on file    Highest education level: Not on file  Occupational History   Not on file  Tobacco Use   Smoking status: Never   Smokeless tobacco: Never  Substance and Sexual Activity   Alcohol use: No   Drug use: No   Sexual activity: Not on file  Other Topics Concern   Not on file  Social History Narrative   Not on file   Social Determinants of Health   Financial Resource Strain: Low Risk    Difficulty of Paying Living Expenses: Not very hard  Food Insecurity: Not on file  Transportation Needs: Not on file  Physical Activity: Not on file  Stress: Not on file  Social Connections: Not on file   No family history on file. Past Surgical History:  Procedure Laterality Date   ANKLE SURGERY     BRAIN SURGERY     CHOLECYSTECTOMY     FRACTURE SURGERY N/A    Phreesia 02/27/2020   IR RADIOLOGIST EVAL & MGMT  08/26/2016   IR VERTEBROPLASTY LUMBAR BX INC UNI/BIL INC/INJECT/IMAGING  07/30/2016   WRIST SURGERY         Vanessa Kick, MD 10/06/20 0930

## 2020-10-08 DIAGNOSIS — M25561 Pain in right knee: Secondary | ICD-10-CM | POA: Diagnosis not present

## 2020-10-22 ENCOUNTER — Other Ambulatory Visit: Payer: Self-pay | Admitting: Family Medicine

## 2020-10-26 ENCOUNTER — Telehealth: Payer: Self-pay | Admitting: Pharmacist

## 2020-10-26 NOTE — Progress Notes (Addendum)
Chronic Care Management Pharmacy Assistant   Name: Veronica Flores  MRN: [id number] DOB: 05-09-1945  Reason for Encounter: Disease State For HTN.   Conditions to be addressed/monitored: HTN, Asthma, Osteopenia  Recent office visits:  None since 08/29/20  Recent consult visits:  None since 08/29/20  Hospital visits:  10/03/20 Urgent Care Vanessa Kick, MD. (1 Hour) For Acute knee pain. STARTED Prednisone 40 mg daily.   Medications: Outpatient Encounter Medications as of 10/26/2020  Medication Sig   aspirin EC 81 MG tablet Take 81 mg by mouth daily. Swallow whole.   azaTHIOprine (IMURAN) 50 MG tablet Take 100 mg by mouth daily. 2 pills once a day   Calcium Carbonate-Vitamin D 600-400 MG-UNIT chew tablet Chew 1 tablet by mouth 2 (two) times daily.   docusate sodium (COLACE) 100 MG capsule Take 100 mg by mouth daily.   fluticasone-salmeterol (ADVAIR) 100-50 MCG/ACT AEPB Inhale 1 puff into the lungs 2 (two) times daily.   ipratropium (ATROVENT) 0.06 % nasal spray USE ONE SPRAY NASALLY THREE TIMES DAILY   levocetirizine (XYZAL) 5 MG tablet Take 1 tablet (5 mg total) by mouth every evening.   meloxicam (MOBIC) 15 MG tablet TAKE 1 TABLET(15 MG) BY MOUTH DAILY   mometasone (ELOCON) 0.1 % cream Apply 1 application topically daily. (Patient taking differently: Apply 1 application topically daily as needed (skin irritations).)   montelukast (SINGULAIR) 10 MG tablet TAKE 1 TABLET BY MOUTH AT BEDTIME   predniSONE (DELTASONE) 20 MG tablet Take 2 tablets (40 mg total) by mouth daily.   rosuvastatin (CRESTOR) 20 MG tablet Take 1 tablet (20 mg total) by mouth daily.   valsartan (DIOVAN) 80 MG tablet Take 1 tablet (80 mg total) by mouth daily.   verapamil (CALAN-SR) 240 MG CR tablet TAKE 1 TABLET BY MOUTH DAILY   No facility-administered encounter medications on file as of 10/26/2020.    Reviewed chart prior to disease state call. Spoke with patient regarding BP  Recent Office Vitals: BP Readings  from Last 3 Encounters:  10/03/20 (!) 146/78  08/06/20 140/82  02/06/20 (!) 156/76   Pulse Readings from Last 3 Encounters:  10/03/20 77  08/06/20 70  02/06/20 62    Wt Readings from Last 3 Encounters:  08/06/20 183 lb (83 kg)  02/06/20 183 lb 3.2 oz (83.1 kg)  12/05/19 183 lb 3.2 oz (83.1 kg)     Kidney Function Lab Results  Component Value Date/Time   CREATININE 0.88 08/06/2020 08:20 AM   CREATININE 0.82 07/22/2018 09:31 AM   GFRNONAA 65 08/06/2020 08:20 AM   GFRAA 75 08/06/2020 08:20 AM    BMP Latest Ref Rng & Units 08/06/2020 07/22/2018 01/14/2018  Glucose 65 - 99 mg/dL 104(H) 89 78  BUN 7 - 25 mg/dL 17 16 13   Creatinine 0.60 - 0.93 mg/dL 0.88 0.82 0.82  BUN/Creat Ratio 6 - 22 (calc) NOT APPLICABLE NOT APPLICABLE NOT APPLICABLE  Sodium 431 - 146 mmol/L 139 142 140  Potassium 3.5 - 5.3 mmol/L 4.5 4.5 4.5  Chloride 98 - 110 mmol/L 106 111(H) 110  CO2 20 - 32 mmol/L 24 19(L) 21  Calcium 8.6 - 10.4 mg/dL 9.7 9.9 9.4    Current antihypertensive regimen:  Valsartan 80mg  daily Verapamil 240 mg CR  How often are you checking your Blood Pressure? 1-2x per week  Current home BP readings:  10/13/20 121/71 10/15/20 121/64 10/16/20 140/62 6/30 131/66  What recent interventions/DTPs have been made by any provider to improve Blood Pressure control  since last CPP Visit: None.  Any recent hospitalizations or ED visits since last visit with CPP? Patient stated no.  What diet changes have been made to improve Blood Pressure Control?  Patient stated not much has changed within her diet.  What exercise is being done to improve your Blood Pressure Control?  Patient stated she works out in the yard daily but has not been that active out in the yard because she recently fell and hurt a her right knee.  Adherence Review: Is the patient currently on ACE/ARB medication? Valsartan 80 mg  Does the patient have >5 day gap between last estimated fill dates? Per msic rpts, no.  Star  Rating Drugs:  Rosuvastatin 20 mg 90 DS 10/20/20, Valsartan 80 mg 90 DS 10/23/20.   Patient stated she does not have any questions or concerns about her medications at this time  [place], [place] Pharmacist Assistant [phone]  10 minutes spent in review, coordination, and documentation.  Reviewed by: Beverly Milch, PharmD Clinical Pharmacist [facility] [phone]

## 2020-11-13 ENCOUNTER — Encounter: Payer: Self-pay | Admitting: Family Medicine

## 2020-11-13 ENCOUNTER — Other Ambulatory Visit: Payer: Self-pay | Admitting: Family Medicine

## 2020-11-13 MED ORDER — SULFAMETHOXAZOLE-TRIMETHOPRIM 800-160 MG PO TABS: 1.0000 | ORAL_TABLET | Freq: Two times a day (BID) | ORAL | 0 refills | Status: DC

## 2020-12-18 DIAGNOSIS — H2513 Age-related nuclear cataract, bilateral: Secondary | ICD-10-CM | POA: Diagnosis not present

## 2020-12-18 DIAGNOSIS — H52203 Unspecified astigmatism, bilateral: Secondary | ICD-10-CM | POA: Diagnosis not present

## 2020-12-18 DIAGNOSIS — D496 Neoplasm of unspecified behavior of brain: Secondary | ICD-10-CM | POA: Diagnosis not present

## 2020-12-21 ENCOUNTER — Telehealth: Payer: Self-pay | Admitting: Pharmacist

## 2020-12-21 NOTE — Progress Notes (Addendum)
Chronic Care Management Pharmacy Assistant   Name: Veronica Flores  MRN: [id number] DOB: Feb 23, 1946  Reason for Encounter: Disease State For HTN.   Conditions to be addressed/monitored: HTN, Asthma, Osteopenia  Recent office visits:  None since 10/26/20  Recent consult visits:  None since 10/26/20  Hospital visits:  None since 10/26/20  Medications: Outpatient Encounter Medications as of 12/21/2020  Medication Sig   aspirin EC 81 MG tablet Take 81 mg by mouth daily. Swallow whole.   azaTHIOprine (IMURAN) 50 MG tablet Take 100 mg by mouth daily. 2 pills once a day   Calcium Carbonate-Vitamin D 600-400 MG-UNIT chew tablet Chew 1 tablet by mouth 2 (two) times daily.   docusate sodium (COLACE) 100 MG capsule Take 100 mg by mouth daily.   fluticasone-salmeterol (ADVAIR) 100-50 MCG/ACT AEPB Inhale 1 puff into the lungs 2 (two) times daily.   ipratropium (ATROVENT) 0.06 % nasal spray USE ONE SPRAY NASALLY THREE TIMES DAILY   levocetirizine (XYZAL) 5 MG tablet Take 1 tablet (5 mg total) by mouth every evening.   meloxicam (MOBIC) 15 MG tablet TAKE 1 TABLET(15 MG) BY MOUTH DAILY   mometasone (ELOCON) 0.1 % cream Apply 1 application topically daily. (Patient taking differently: Apply 1 application topically daily as needed (skin irritations).)   montelukast (SINGULAIR) 10 MG tablet TAKE 1 TABLET BY MOUTH AT BEDTIME   predniSONE (DELTASONE) 20 MG tablet Take 2 tablets (40 mg total) by mouth daily.   rosuvastatin (CRESTOR) 20 MG tablet Take 1 tablet (20 mg total) by mouth daily.   sulfamethoxazole-trimethoprim (BACTRIM DS) 800-160 MG tablet Take 1 tablet by mouth 2 (two) times daily.   valsartan (DIOVAN) 80 MG tablet Take 1 tablet (80 mg total) by mouth daily.   verapamil (CALAN-SR) 240 MG CR tablet TAKE 1 TABLET BY MOUTH DAILY   No facility-administered encounter medications on file as of 12/21/2020.    Reviewed chart prior to disease state call. Spoke with patient regarding BP  Recent  Office Vitals: BP Readings from Last 3 Encounters:  10/03/20 (!) 146/78  08/06/20 140/82  02/06/20 (!) 156/76   Pulse Readings from Last 3 Encounters:  10/03/20 77  08/06/20 70  02/06/20 62    Wt Readings from Last 3 Encounters:  08/06/20 183 lb (83 kg)  02/06/20 183 lb 3.2 oz (83.1 kg)  12/05/19 183 lb 3.2 oz (83.1 kg)     Kidney Function Lab Results  Component Value Date/Time   CREATININE 0.88 08/06/2020 08:20 AM   CREATININE 0.82 07/22/2018 09:31 AM   GFRNONAA 65 08/06/2020 08:20 AM   GFRAA 75 08/06/2020 08:20 AM    BMP Latest Ref Rng & Units 08/06/2020 07/22/2018 01/14/2018  Glucose 65 - 99 mg/dL 104(H) 89 78  BUN 7 - 25 mg/dL '17 16 13  '$ Creatinine 0.60 - 0.93 mg/dL 0.88 0.82 0.82  BUN/Creat Ratio 6 - 22 (calc) NOT APPLICABLE NOT APPLICABLE NOT APPLICABLE  Sodium A999333 - 146 mmol/L 139 142 140  Potassium 3.5 - 5.3 mmol/L 4.5 4.5 4.5  Chloride 98 - 110 mmol/L 106 111(H) 110  CO2 20 - 32 mmol/L 24 19(L) 21  Calcium 8.6 - 10.4 mg/dL 9.7 9.9 9.4    Current antihypertensive regimen:  Valsartan '80mg'$  daily Verapamil 240 mg CR  How often are you checking your Blood Pressure? Patient stated 1-2x per week.  Current home BP readings: Patient stated his week has not been the easiest but she does check her blood pressure regularly.   What  recent interventions/DTPs have been made by any provider to improve Blood Pressure control since last CPP Visit: None.  Any recent hospitalizations or ED visits since last visit with CPP? Patient stated no.  What diet changes have been made to improve Blood Pressure Control?  Patient stated she is still eating the same foods.   What exercise is being done to improve your Blood Pressure Control?  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   Adherence Review: Is the patient currently on ACE/ARB medication? Valsartan 80 mg  Does the patient have >5 day gap between last estimated fill dates? Per msic rpts, no.  Star Rating  Drugs: Rosuvastatin 20 mg 10/24/20 90 DS, Valsartan 80 mg 10/24/20 90 DS.  Follow-Up:Pharmacist Review  Charlann Lange, RMA Clinical Pharmacist Assistant [phone]  10 minutes spent in review, coordination, and documentation.  Reviewed by: Beverly Milch, PharmD Clinical Pharmacist [phone]

## 2021-01-10 ENCOUNTER — Other Ambulatory Visit: Payer: Self-pay

## 2021-01-10 ENCOUNTER — Ambulatory Visit (INDEPENDENT_AMBULATORY_CARE_PROVIDER_SITE_OTHER): Payer: Medicare Other | Admitting: *Deleted

## 2021-01-10 DIAGNOSIS — Z23 Encounter for immunization: Secondary | ICD-10-CM | POA: Diagnosis not present

## 2021-01-15 DIAGNOSIS — Z1231 Encounter for screening mammogram for malignant neoplasm of breast: Secondary | ICD-10-CM | POA: Diagnosis not present

## 2021-01-15 LAB — HM MAMMOGRAPHY

## 2021-01-16 DIAGNOSIS — K754 Autoimmune hepatitis: Secondary | ICD-10-CM | POA: Diagnosis not present

## 2021-01-31 ENCOUNTER — Other Ambulatory Visit: Payer: Self-pay | Admitting: *Deleted

## 2021-01-31 MED ORDER — VALSARTAN 80 MG PO TABS: 80.0000 mg | ORAL_TABLET | Freq: Every day | ORAL | 3 refills | Status: DC

## 2021-02-01 DIAGNOSIS — K754 Autoimmune hepatitis: Secondary | ICD-10-CM | POA: Diagnosis not present

## 2021-02-04 ENCOUNTER — Other Ambulatory Visit: Payer: Self-pay

## 2021-02-04 ENCOUNTER — Encounter: Payer: Self-pay | Admitting: Family Medicine

## 2021-02-04 ENCOUNTER — Ambulatory Visit (INDEPENDENT_AMBULATORY_CARE_PROVIDER_SITE_OTHER): Payer: Medicare Other | Admitting: Family Medicine

## 2021-02-04 VITALS — BP 120/68 | HR 78 | Temp 98.3°F | Resp 16 | Ht 67.0 in | Wt 181.0 lb

## 2021-02-04 DIAGNOSIS — M8580 Other specified disorders of bone density and structure, unspecified site: Secondary | ICD-10-CM | POA: Diagnosis not present

## 2021-02-04 DIAGNOSIS — I10 Essential (primary) hypertension: Secondary | ICD-10-CM | POA: Diagnosis not present

## 2021-02-04 DIAGNOSIS — K754 Autoimmune hepatitis: Secondary | ICD-10-CM | POA: Diagnosis not present

## 2021-02-04 DIAGNOSIS — I421 Obstructive hypertrophic cardiomyopathy: Secondary | ICD-10-CM | POA: Diagnosis not present

## 2021-02-04 DIAGNOSIS — E7800 Pure hypercholesterolemia, unspecified: Secondary | ICD-10-CM | POA: Diagnosis not present

## 2021-02-04 MED ORDER — VALSARTAN 80 MG PO TABS: 80.0000 mg | ORAL_TABLET | Freq: Every day | ORAL | 3 refills | Status: DC

## 2021-02-04 NOTE — Progress Notes (Signed)
Subjective:    Patient ID: Veronica Flores, female    DOB: 19-Aug-1945, 75 y.o.   MRN: [id number]   Patient is a very pleasant 75 year old Caucasian female who presents today for a checkup.  She has a history of autoimmune hepatitis, hypertrophic cardiomyopathy, hypertension, and hyperlipidemia.  Last colonoscopy was 2021 and recheck in 5 years.  She just had her mammogram performed.  She is scheduled her COVID vaccination but she has not received it yet.  Overall she is doing well.  She does complain of some pain in her right knee after a fall that she suffered this summer.  Is more of a grinding sensation underneath the kneecap whenever she walks.  She declines a cortisone injection today.  She had an x-ray and an orthopedist that did not see any fractures or arthritis.  I suspect likely cartilage damage as the cause of the pain.  She denies any jaundice.  She denies any right upper quadrant pain.  She denies any nausea or vomiting.  She is dealing with a tremendous amount of stress caring for her husband who has end-stage Parkinson's disease. Past Medical History:  Diagnosis Date   Allergy    Asthma    Asthma    [employer] 02/27/2020   Autoimmune hepatitis ([place])    Fatigue    Hypertension    Hypertrophic cardiomyopathy ([place])    Meningioma (HCC)    s/p craniotomy and removal from right medial sphenoid wing extending into cavernous sinus   Migraine, ophthalmoplegic    Osteopenia    Past Surgical History:  Procedure Laterality Date   ANKLE SURGERY     BRAIN SURGERY     CHOLECYSTECTOMY     FRACTURE SURGERY N/A    [employer] 02/27/2020   IR RADIOLOGIST EVAL & MGMT  08/26/2016   IR VERTEBROPLASTY LUMBAR BX INC UNI/BIL INC/INJECT/IMAGING  07/30/2016   WRIST SURGERY     Current Outpatient Medications on File Prior to Visit  Medication Sig Dispense Refill   aspirin EC 81 MG tablet Take 81 mg by mouth daily. Swallow whole.     azaTHIOprine (IMURAN) 50 MG tablet Take 100 mg by mouth daily. 2  pills once a day     Calcium Carbonate-Vitamin D 600-400 MG-UNIT chew tablet Chew 1 tablet by mouth 2 (two) times daily.     docusate sodium (COLACE) 100 MG capsule Take 100 mg by mouth daily.     fluticasone-salmeterol (ADVAIR) 100-50 MCG/ACT AEPB Inhale 1 puff into the lungs 2 (two) times daily. 180 each 3   ipratropium (ATROVENT) 0.06 % nasal spray USE ONE SPRAY NASALLY THREE TIMES DAILY 45 mL 3   levocetirizine (XYZAL) 5 MG tablet Take 1 tablet (5 mg total) by mouth every evening. 30 tablet 11   meloxicam (MOBIC) 15 MG tablet TAKE 1 TABLET(15 MG) BY MOUTH DAILY 90 tablet 2   mometasone (ELOCON) 0.1 % cream Apply 1 application topically daily. (Patient taking differently: Apply 1 application topically daily as needed (skin irritations).) 45 g 0   montelukast (SINGULAIR) 10 MG tablet TAKE 1 TABLET BY MOUTH AT BEDTIME 90 tablet 3   predniSONE (DELTASONE) 20 MG tablet Take 2 tablets (40 mg total) by mouth daily. 10 tablet 0   rosuvastatin (CRESTOR) 20 MG tablet Take 1 tablet (20 mg total) by mouth daily. 90 tablet 1   sulfamethoxazole-trimethoprim (BACTRIM DS) 800-160 MG tablet Take 1 tablet by mouth 2 (two) times daily. 14 tablet 0   valsartan (DIOVAN) 80 MG tablet  Take 1 tablet (80 mg total) by mouth daily. 90 tablet 3   verapamil (CALAN-SR) 240 MG CR tablet TAKE 1 TABLET BY MOUTH DAILY 90 tablet 3   No current facility-administered medications on file prior to visit.   Allergies  Allergen Reactions   Penicillins Anaphylaxis   Allegra [Fexofenadine Hydrochloride]     palpitations   Desloratadine     Palpitations    Cephalexin Hives and Rash   Social History   Socioeconomic History   Marital status: Married    Spouse name: Not on file   Number of children: Not on file   Years of education: Not on file   Highest education level: Not on file  Occupational History   Not on file  Tobacco Use   Smoking status: Never   Smokeless tobacco: Never  Substance and Sexual Activity    Alcohol use: No   Drug use: No   Sexual activity: Not on file  Other Topics Concern   Not on file  Social History Narrative   Not on file   Social Determinants of Health   Financial Resource Strain: Low Risk    Difficulty of Paying Living Expenses: Not very hard  Food Insecurity: Not on file  Transportation Needs: Not on file  Physical Activity: Not on file  Stress: Not on file  Social Connections: Not on file  Intimate Partner Violence: Not on file     Review of Systems  All other systems reviewed and are negative.     Objective:   Physical Exam Vitals reviewed.  Constitutional:      General: She is not in acute distress.    Appearance: She is well-developed. She is not diaphoretic.  HENT:     Head: Normocephalic and atraumatic.     Nose: Nose normal.     Mouth/Throat:     Pharynx: No oropharyngeal exudate.  Eyes:     Conjunctiva/sclera: Conjunctivae normal.  Cardiovascular:     Rate and Rhythm: Normal rate and regular rhythm.     Heart sounds: Normal heart sounds. No murmur heard. Pulmonary:     Effort: Pulmonary effort is normal. No respiratory distress.     Breath sounds: Normal breath sounds. No wheezing or rales.  Abdominal:     General: Bowel sounds are normal. There is no distension.     Palpations: Abdomen is soft.     Tenderness: There is no abdominal tenderness. There is no guarding or rebound.  Musculoskeletal:     Cervical back: Neck supple.  Lymphadenopathy:     Cervical: No cervical adenopathy.  Neurological:     Mental Status: She is alert and oriented to person, place, and time.     Cranial Nerves: No cranial nerve deficit.     Motor: No abnormal muscle tone.     Coordination: Coordination normal.     Deep Tendon Reflexes: Reflexes are normal and symmetric.  Psychiatric:        Behavior: Behavior normal.        Thought Content: Thought content normal.        Judgment: Judgment normal.          Assessment & Plan:  Primary  hypertension - Plan: CBC with Differential/Platelet, COMPLETE METABOLIC PANEL WITH GFR, Lipid panel  Osteopenia, unspecified location  Pure hypercholesterolemia - Plan: CBC with Differential/Platelet, COMPLETE METABOLIC PANEL WITH GFR, Lipid panel  HOCM (hypertrophic obstructive cardiomyopathy) (HCC) - Plan: CBC with Differential/Platelet, COMPLETE METABOLIC PANEL WITH GFR, Lipid panel  Autoimmune  hepatitis ([place]) - Plan: CBC with Differential/Platelet, COMPLETE METABOLIC PANEL WITH GFR, Lipid panel, Sedimentation rate Blood pressure today is outstanding.  Check CBC CMP and lipid panel.  Ideally I like to keep her LDL cholesterol below 130.  Monitor sedimentation rate to evaluate autoimmune hepatitis.  At the present time it seems to be in remission and controlled with her azathioprine.

## 2021-02-05 ENCOUNTER — Other Ambulatory Visit: Payer: Self-pay | Admitting: *Deleted

## 2021-02-05 ENCOUNTER — Other Ambulatory Visit: Payer: Self-pay

## 2021-02-05 DIAGNOSIS — K754 Autoimmune hepatitis: Secondary | ICD-10-CM

## 2021-02-05 LAB — LIPID PANEL
Cholesterol: 97 mg/dL (ref <200)
HDL: 35 mg/dL — ABNORMAL LOW (ref >=50)
LDL Cholesterol (Calc): 43 mg/dL (calc)
Non-HDL Cholesterol (Calc): 62 mg/dL (calc) (ref <130)
Total CHOL/HDL Ratio: 2.8 (calc) (ref <5.0)
Triglycerides: 104 mg/dL (ref <150)

## 2021-02-05 LAB — CBC WITH DIFFERENTIAL/PLATELET
Absolute Monocytes: 367 cells/uL (ref 200–950)
Basophils Absolute: 10 cells/uL (ref 0–200)
Basophils Relative: 0.2 %
Eosinophils Absolute: 10 cells/uL — ABNORMAL LOW (ref 15–500)
Eosinophils Relative: 0.2 %
HCT: 43.9 % (ref 35.0–45.0)
Hemoglobin: 14.3 g/dL (ref 11.7–15.5)
Lymphs Abs: 1443 cells/uL (ref 850–3900)
MCH: 29.5 pg (ref 27.0–33.0)
MCHC: 32.6 g/dL (ref 32.0–36.0)
MCV: 90.7 fL (ref 80.0–100.0)
MPV: 9.5 fL (ref 7.5–12.5)
Monocytes Relative: 7.2 %
Neutro Abs: 3269 cells/uL (ref 1500–7800)
Neutrophils Relative %: 64.1 %
Platelets: 296 10*3/uL (ref 140–400)
RBC: 4.84 10*6/uL (ref 3.80–5.10)
RDW: 13.4 % (ref 11.0–15.0)
Total Lymphocyte: 28.3 %
WBC: 5.1 10*3/uL (ref 3.8–10.8)

## 2021-02-05 LAB — COMPLETE METABOLIC PANEL WITH GFR
AG Ratio: 1.8 (calc) (ref 1.0–2.5)
ALT: 14 U/L (ref 6–29)
AST: 22 U/L (ref 10–35)
Albumin: 4.5 g/dL (ref 3.6–5.1)
Alkaline phosphatase (APISO): 54 U/L (ref 37–153)
BUN: 17 mg/dL (ref 7–25)
CO2: 23 mmol/L (ref 20–32)
Calcium: 9.7 mg/dL (ref 8.6–10.4)
Chloride: 107 mmol/L (ref 98–110)
Creat: 0.87 mg/dL (ref 0.60–1.00)
Globulin: 2.5 g/dL (calc) (ref 1.9–3.7)
Glucose, Bld: 97 mg/dL (ref 65–99)
Potassium: 4.4 mmol/L (ref 3.5–5.3)
Sodium: 140 mmol/L (ref 135–146)
Total Bilirubin: 0.5 mg/dL (ref 0.2–1.2)
Total Protein: 7 g/dL (ref 6.1–8.1)
eGFR: 70 mL/min/{1.73_m2} (ref >=60)

## 2021-02-05 LAB — SEDIMENTATION RATE: Sed Rate: 2 mm/h (ref 0–30)

## 2021-02-06 ENCOUNTER — Telehealth: Payer: Self-pay | Admitting: *Deleted

## 2021-02-06 NOTE — Telephone Encounter (Signed)
Call placed to patient and patient made aware.   Orders placed.

## 2021-02-06 NOTE — Telephone Encounter (Signed)
-----   Message from Veronica Frizzle, MD sent at 02/05/2021  7:15 AM EDT ----- Dr. Earlean Shawl wanted an afp.  We can get this any time she can come by.

## 2021-02-07 ENCOUNTER — Other Ambulatory Visit: Payer: Medicare Other

## 2021-02-07 ENCOUNTER — Encounter: Payer: Self-pay | Admitting: Family Medicine

## 2021-02-07 ENCOUNTER — Other Ambulatory Visit: Payer: Self-pay

## 2021-02-07 DIAGNOSIS — K754 Autoimmune hepatitis: Secondary | ICD-10-CM

## 2021-02-08 LAB — AFP TUMOR MARKER: AFP-Tumor Marker: 2.4 ng/mL

## 2021-02-08 MED ORDER — NIRMATRELVIR/RITONAVIR (PAXLOVID)TABLET: 3.0000 | ORAL_TABLET | Freq: Two times a day (BID) | ORAL | 0 refills | Status: AC

## 2021-02-08 NOTE — Telephone Encounter (Signed)
Patient tested positive for COVID on 02/07/2021.   Sx began 02/07/2021 and include HA, low grade fever/ chills, head and chest congestion with yellow mucus, fatigue, cough.  Advised to continue symptom management with OTC medications: Tylenol/ Ibuprofen for fever/ body aches, Mucinex/ Delsym for cough/ chest congestion, Afrin/Sudafed/nasal saline for sinus pressure/ nasal congestion.  GFR 70. Order for Paxlovid sent to pharmacy. Advised possible SE include nausea, diarrhea, loss of taste and hypertension.  If chest pain, shortness of breath, fever >104 that is unresponsive to antipyretics noted, or if unable to tolerate fluids, advised to go to ER for evaluation.   Advised that the CDC recommends the following criteria prior to ending isolation in vaccinated and unvaccinated persons: at least 5 days since symptoms onset, then an additional 5 days of masking  AND 3 days fever free without antipyretics (Tylenol or Ibuprofen) AND improvement in respiratory symptoms.

## 2021-02-28 NOTE — Progress Notes (Signed)
Chronic Care Management Pharmacy Note  03/08/2021 Name:  Veronica Flores MRN:  [id number] DOB:  06-08-1945  Subjective: Veronica Flores is an 75 y.o. year old female who is a primary patient of Pickard, Cammie Mcgee, MD.  The CCM team was consulted for assistance with disease management and care coordination needs.    Engaged with patient by telephone for follow up visit in response to provider referral for pharmacy case management and/or care coordination services.   Consent to Services:  The patient was given the following information about Chronic Care Management services today, agreed to services, and gave verbal consent: 1. CCM service includes personalized support from designated clinical staff supervised by the primary care provider, including individualized plan of care and coordination with other care providers 2. 24/7 contact phone numbers for assistance for urgent and routine care needs. 3. Service will only be billed when office clinical staff spend 20 minutes or more in a month to coordinate care. 4. Only one practitioner may furnish and bill the service in a calendar month. 5.The patient may stop CCM services at any time (effective at the end of the month) by phone call to the office staff. 6. The patient will be responsible for cost sharing (co-pay) of up to 20% of the service fee (after annual deductible is met). Patient agreed to services and consent obtained.  Patient Care Team: Susy Frizzle, MD as PCP - General (Family Medicine) Edythe Clarity, [facility] as Pharmacist (Pharmacist)  Recent office visits: 08/09/20 Edsel Petrin, AWV) - no changes at this visit  08/06/20 (Pickard) - BP controlled no medication changes Recent consult visits: None recent  Hospital visits: None in previous 6 months  Objective:  Lab Results  Component Value Date   CREATININE 0.87 02/04/2021   BUN 17 02/04/2021   GFRNONAA 65 08/06/2020   GFRAA 75 08/06/2020   NA 140 02/04/2021   K 4.4  02/04/2021   CALCIUM 9.7 02/04/2021   CO2 23 02/04/2021   GLUCOSE 97 02/04/2021    Lab Results  Component Value Date/Time   HGBA1C 5.4 05/27/2013 08:54 AM   HGBA1C 5.5 11/18/2011 06:04 PM    Last diabetic Eye exam: No results found for: HMDIABEYEEXA  Last diabetic Foot exam: No results found for: HMDIABFOOTEX   Lab Results  Component Value Date   CHOL 97 02/04/2021   HDL 35 (L) 02/04/2021   LDLCALC 43 02/04/2021   TRIG 104 02/04/2021   CHOLHDL 2.8 02/04/2021    Hepatic Function Latest Ref Rng & Units 02/04/2021 08/06/2020 07/22/2018  Total Protein 6.1 - 8.1 g/dL 7.0 7.1 7.0  Albumin 3.6 - 5.1 g/dL - - -  AST 10 - 35 U/L 22 22 21   ALT 6 - 29 U/L 14 14 14   Alk Phosphatase 33 - 130 U/L - - -  Total Bilirubin 0.2 - 1.2 mg/dL 0.5 0.6 0.6    Lab Results  Component Value Date/Time   TSH 1.25 05/29/2015 08:16 AM   TSH 3.344 11/17/2010 11:03 PM    CBC Latest Ref Rng & Units 02/04/2021 08/06/2020 07/22/2018  WBC 3.8 - 10.8 Thousand/uL 5.1 5.4 4.8  Hemoglobin 11.7 - 15.5 g/dL 14.3 15.0 15.0  Hematocrit 35.0 - 45.0 % 43.9 47.1(H) 44.3  Platelets 140 - 400 Thousand/uL 296 320 314    No results found for: VD25OH  Clinical ASCVD: No  The ASCVD Risk score (Arnett DK, et al., 2019) failed to calculate for the following reasons:   The valid total  cholesterol range is 130 to 320 mg/dL    Depression screen [facility] 2/9 08/09/2020 08/06/2020 07/13/2017  Decreased Interest 0 0 0  Down, Depressed, Hopeless 0 0 0  PHQ - 2 Score 0 0 0       Social History   Tobacco Use  Smoking Status Never  Smokeless Tobacco Never   BP Readings from Last 3 Encounters:  02/04/21 120/68  10/03/20 (!) 146/78  08/06/20 140/82   Pulse Readings from Last 3 Encounters:  02/04/21 78  10/03/20 77  08/06/20 70   Wt Readings from Last 3 Encounters:  02/04/21 181 lb (82.1 kg)  08/06/20 183 lb (83 kg)  02/06/20 183 lb 3.2 oz (83.1 kg)   BMI Readings from Last 3 Encounters:  02/04/21 28.35 kg/m   08/06/20 28.66 kg/m  02/06/20 28.69 kg/m    Assessment/Interventions: Review of patient past medical history, allergies, medications, health status, including review of consultants reports, laboratory and other test data, was performed as part of comprehensive evaluation and provision of chronic care management services.   SDOH:  (Social Determinants of Health) assessments and interventions performed: Yes   Financial Resource Strain: Not on file    SDOH Screenings   Alcohol Screen: Low Risk    Last Alcohol Screening Score (AUDIT): 0  Depression (PHQ2-9): Low Risk    PHQ-2 Score: 0  Financial Resource Strain: Not on file  Food Insecurity: Not on file  Housing: Not on file  Physical Activity: Not on file  Social Connections: Not on file  Stress: Not on file  Tobacco Use: Low Risk    Smoking Tobacco Use: Never   Smokeless Tobacco Use: Never   Passive Exposure: Not on file  Transportation Needs: Not on file    [place]  Allergies  Allergen Reactions   Penicillins Anaphylaxis   Allegra [Fexofenadine Hydrochloride]     palpitations   Desloratadine     Palpitations    Cephalexin Hives and Rash    Medications Reviewed Today     Reviewed by Edythe Clarity, [facility] (Pharmacist) on 03/08/21 at 1137  Med List Status: <None>   Medication Order Taking? Sig Documenting Provider Last Dose Status Informant  aspirin EC 81 MG tablet [id number] Yes Take 81 mg by mouth daily. Swallow whole. [provider] Taking Active   azaTHIOprine (IMURAN) 50 MG tablet [id number] Yes Take 100 mg by mouth daily. 2 pills once a day [provider] Taking Active Self  Calcium Carbonate-Vitamin D 600-400 MG-UNIT chew tablet [id number] Yes Chew 1 tablet by mouth 2 (two) times daily. [provider] Taking Active Self  docusate sodium (COLACE) 100 MG capsule [id number] Yes Take 100 mg by mouth daily. [provider] Taking Active Self  fluticasone-salmeterol  (ADVAIR) 100-50 MCG/ACT AEPB [id number] Yes Inhale 1 puff into the lungs 2 (two) times daily. Susy Frizzle, MD Taking Active   ipratropium (ATROVENT) 0.06 % nasal spray [id number] Yes USE ONE SPRAY NASALLY THREE TIMES DAILY Susy Frizzle, MD Taking Active   levocetirizine (XYZAL) 5 MG tablet [id number] Yes Take 1 tablet (5 mg total) by mouth every evening. Susy Frizzle, MD Taking Active   montelukast (SINGULAIR) 10 MG tablet [id number] Yes TAKE 1 TABLET BY MOUTH AT BEDTIME Susy Frizzle, MD Taking Active   rosuvastatin (CRESTOR) 20 MG tablet [id number] Yes Take 1 tablet (20 mg total) by mouth daily. Susy Frizzle, MD Taking Active   valsartan (DIOVAN) 80 MG tablet [id number] Yes Take 1 tablet (  80 mg total) by mouth daily. 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    Immunization History  Administered Date(s) Administered   Fluad Quad(high Dose 65+) 01/11/2019, 02/06/2020, 01/10/2021   Hepatitis A 12/19/2003, 10/13/2006   Hepatitis B 12/19/2003, 10/13/2006   Influenza Whole 01/02/2010   Influenza, High Dose Seasonal PF 02/05/2017, 01/14/2018   Influenza,inj,Quad PF,6+ Mos 01/27/2013, 02/09/2014, 02/08/2015, 02/07/2016   PFIZER(Purple Top)SARS-COV-2 Vaccination 05/28/2019, 06/21/2019, 01/03/2020   Pneumococcal Conjugate-13 05/27/2013   Pneumococcal Polysaccharide-23 01/20/2004, 09/02/2010, 01/14/2018   Td 04/22/2003   Tdap 03/18/2011    Conditions to be addressed/monitored:  HTN,  Asthma, Osteopenia  Care Plan : General Pharmacy (Adult)  Updates made by Edythe Clarity, RPH since 03/08/2021 12:00 AM     Problem: HTN, Asthma, Osteopenia   Priority: High  Onset Date: 03/03/2021     Long-Range Goal: Patient-Specific Goal   Start Date: 08/29/2020  Expected End Date: 03/03/2021  Recent Progress: On track  Priority: High  Note:   Current Barriers:  none identified at this time  Pharmacist Clinical Goal(s):  Patient will maintain control of blood pressure as evidenced by home monitoring  adhere to prescribed medication regimen as evidenced by filld ates contact provider office for questions/concerns as evidenced notation of same in electronic health record through collaboration with PharmD and provider.   Interventions: 1:1 collaboration with Susy Frizzle, MD regarding development and update of comprehensive plan of care as evidenced by provider attestation and co-signature Inter-disciplinary care team collaboration (see longitudinal plan of care) Comprehensive medication review performed; medication list updated in electronic medical record  Hypertension (BP goal <140/90) -Controlled -Current treatment: Valsartan 12m daily Verapamil 240 mg CR -Medications previously tried: none noted  -Current home readings: 122/73, 138/63, 132/76, 141/72, 116/64 -Denies hypotensive/hypertensive symptoms -Educated on BP goals and benefits of medications for prevention of heart attack, stroke and kidney damage; Daily salt intake goal < 2300 mg; Exercise goal of 150 minutes per week; Importance of home blood pressure monitoring; -Counseled to monitor BP at home daily, document, and provide log at future appointments -Recommended to continue current medication, home BP excellent  Update 03/08/21 BP has been controlled - most recent OV is normal She has not had much time to check recently at home. Was in car today so no logs provided. Continues to take meds as  directed. Denies any dizziness or HA at home Continue meds for now no changes needed   Osteopenia (Goal Prevent fractures) -Controlled -Last DEXA Scan: 07/20/19  T-Score femoral neck: -1.3  -Patient is not a candidate for pharmacologic treatment -Current treatment  Calcium 6038m+ vitamin D3 -Medications previously tried: none noted  -Recommend [phone] units of vitamin D daily. Recommend 1200 mg of calcium daily from dietary and supplemental sources. Recommend weight-bearing and muscle strengthening exercises for building and maintaining bone density. -Recommended to continue current medication Recommended repeat DEXA 07/2021  Asthma (Goal: Control symptoms) -Controlled -Current treatment  Advair 100-5048mq12h -Medications previously tried:  none noted -Only using once daily still, reports breathing controlled -Denies having rescue inhaler or needing one  -Recommended to continue current medication  Seasonal Allergies (Goal: Reduce symptoms) -Not ideally controlled -Current treatment  Levocetirizine 61m daily -Medications previously tried: none noted -Fall allergies are bothering her currently  -Recommended to continue current medication She is running out of Xyzal - requests refill Will coordinate for refill to be called in to mail order pharmacy    Patient Goals/Self-Care Activities Patient will:  - take medications as prescribed focus on medication adherence by pill count check blood pressure daily, document, and provide at future appointments  Follow Up Plan: The care management team will reach out to the patient again over the next 120 days.             Medication Assistance: None required.  Patient affirms current coverage meets needs.  Patient's preferred pharmacy is:  [facility] [address]Phone: [phone]: [phone] [facility] [place] [address]Phone: [phone]: [phone] [id number] [id number]Phone: [phone]: [phone] Uses pill box? No - organizes in jar Pt endorses 100% compliance  We discussed: Benefits of medication synchronization, packaging and delivery as well as enhanced pharmacist oversight with [employer]. Patient decided to: Continue current medication management strategy  Care Plan and Follow Up Patient Decision:  Patient agrees to Care Plan and Follow-up.  Plan: The care management team will reach out to the patient again over the next 120 days.  CBeverly Milch PharmD Clinical Pharmacist [place]([phone]

## 2021-03-08 ENCOUNTER — Ambulatory Visit (INDEPENDENT_AMBULATORY_CARE_PROVIDER_SITE_OTHER): Payer: Medicare Other | Admitting: Pharmacist

## 2021-03-08 DIAGNOSIS — I10 Essential (primary) hypertension: Secondary | ICD-10-CM

## 2021-03-08 DIAGNOSIS — J302 Other seasonal allergic rhinitis: Secondary | ICD-10-CM

## 2021-03-08 NOTE — Patient Instructions (Addendum)
Visit Information   Goals Addressed             This Visit's Progress    Track and Manage My Blood Pressure-Hypertension   On track    Timeframe:  Long-Range Goal Priority:  High Start Date:   08/29/20                          Expected End Date: 03/01/21                      Follow Up Date 11/29/20    - check blood pressure daily - choose a place to take my blood pressure (home, clinic or office, retail store) - write blood pressure results in a log or diary    Why is this important?   You won't feel high blood pressure, but it can still hurt your blood vessels.  High blood pressure can cause heart or kidney problems. It can also cause a stroke.  Making lifestyle changes like losing a little weight or eating less salt will help.  Checking your blood pressure at home and at different times of the day can help to control blood pressure.  If the doctor prescribes medicine remember to take it the way the doctor ordered.  Call the office if you cannot afford the medicine or if there are questions about it.     Notes:        Patient Care Plan: General Pharmacy (Adult)     Problem Identified: HTN, Asthma, Osteopenia   Priority: High  Onset Date: 03/03/2021     Long-Range Goal: Patient-Specific Goal   Start Date: 08/29/2020  Expected End Date: 03/03/2021  Recent Progress: On track  Priority: High  Note:   Current Barriers:  none identified at this time  Pharmacist Clinical Goal(s):  Patient will maintain control of blood pressure as evidenced by home monitoring  adhere to prescribed medication regimen as evidenced by filld ates contact provider office for questions/concerns as evidenced notation of same in electronic health record through collaboration with PharmD and provider.   Interventions: 1:1 collaboration with Susy Frizzle, MD regarding development and update of comprehensive plan of care as evidenced by provider attestation and  co-signature Inter-disciplinary care team collaboration (see longitudinal plan of care) Comprehensive medication review performed; medication list updated in electronic medical record  Hypertension (BP goal <140/90) -Controlled -Current treatment: Valsartan 80mg  daily Verapamil 240 mg CR -Medications previously tried: none noted  -Current home readings: 122/73, 138/63, 132/76, 141/72, 116/64 -Denies hypotensive/hypertensive symptoms -Educated on BP goals and benefits of medications for prevention of heart attack, stroke and kidney damage; Daily salt intake goal < 2300 mg; Exercise goal of 150 minutes per week; Importance of home blood pressure monitoring; -Counseled to monitor BP at home daily, document, and provide log at future appointments -Recommended to continue current medication, home BP excellent  Update 03/08/21 BP has been controlled - most recent OV is normal She has not had much time to check recently at home. Was in car today so no logs provided. Continues to take meds as directed. Denies any dizziness or HA at home Continue meds for now no changes needed   Osteopenia (Goal Prevent fractures) -Controlled -Last DEXA Scan: 07/20/19  T-Score femoral neck: -1.3  -Patient is not a candidate for pharmacologic treatment -Current treatment  Calcium 600mg  + vitamin D3 -Medications previously tried: none noted  -Recommend [phone] units of vitamin D daily. Recommend  1200 mg of calcium daily from dietary and supplemental sources. Recommend weight-bearing and muscle strengthening exercises for building and maintaining bone density. -Recommended to continue current medication Recommended repeat DEXA 07/2021  Asthma (Goal: Control symptoms) -Controlled -Current treatment  Advair 100-69mcg q12h -Medications previously tried: none noted -Only using once daily still, reports breathing controlled -Denies having rescue inhaler or needing one  -Recommended to continue current  medication  Seasonal Allergies (Goal: Reduce symptoms) -Not ideally controlled -Current treatment  Levocetirizine 5mg  daily -Medications previously tried: none noted -Fall allergies are bothering her currently  -Recommended to continue current medication She is running out of Xyzal - requests refill Will coordinate for refill to be called in to mail order pharmacy    Patient Goals/Self-Care Activities Patient will:  - take medications as prescribed focus on medication adherence by pill count check blood pressure daily, document, and provide at future appointments  Follow Up Plan: The care management team will reach out to the patient again over the next 120 days.            Patient verbalizes understanding of instructions provided today and agrees to view in [place].  Telephone follow up appointment with pharmacy team member scheduled for: 6 months  Veronica Flores, [place]

## 2021-03-12 ENCOUNTER — Other Ambulatory Visit: Payer: Self-pay

## 2021-03-12 MED ORDER — LEVOCETIRIZINE DIHYDROCHLORIDE 5 MG PO TABS: 5.0000 mg | ORAL_TABLET | Freq: Every evening | ORAL | 3 refills | Status: DC

## 2021-03-19 ENCOUNTER — Telehealth: Payer: Self-pay | Admitting: Family Medicine

## 2021-03-19 NOTE — Telephone Encounter (Signed)
Patient and spouse had COVID mid-October; called to ask when it would be safe for them to receive 5th booster shot. Please advise at [phone].

## 2021-03-19 NOTE — Telephone Encounter (Signed)
Spoke with pt and advised that per the CDC recommendations pt's should wait at least 3 months to receive another immunization injection. Pt voiced understanding. Also asked pt to let us know when they do receive future boosters so we can update chart. Nothing further needed.

## 2021-03-20 DIAGNOSIS — M8580 Other specified disorders of bone density and structure, unspecified site: Secondary | ICD-10-CM | POA: Diagnosis not present

## 2021-03-20 DIAGNOSIS — I10 Essential (primary) hypertension: Secondary | ICD-10-CM

## 2021-03-20 DIAGNOSIS — J45909 Unspecified asthma, uncomplicated: Secondary | ICD-10-CM | POA: Diagnosis not present

## 2021-05-07 IMAGING — US BILATERAL CAROTID DUPLEX ULTRASOUND
1 series · 13 of 24 positions shown · non-contrast
Comparison: None.

CLINICAL DATA: TIA with impaired speech, ocular migraine,
hypertension and hyperlipidemia.

EXAM:
BILATERAL CAROTID DUPLEX ULTRASOUND
TECHNIQUE: Gray scale imaging, color Doppler and duplex ultrasound were
performed of bilateral carotid and vertebral arteries in the neck.

[Series 1: bilateral carotid duplex ultrasound · 0.06mm/px · 13 of 46 slices shown]
[im 1/46]
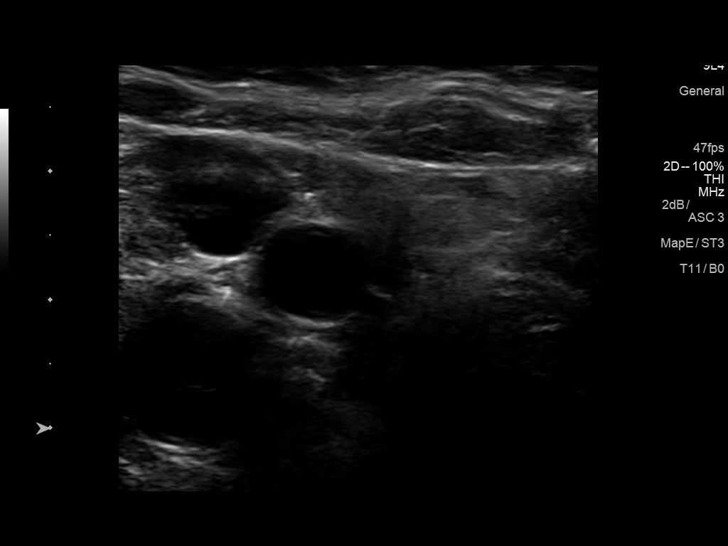
[im 4/46]
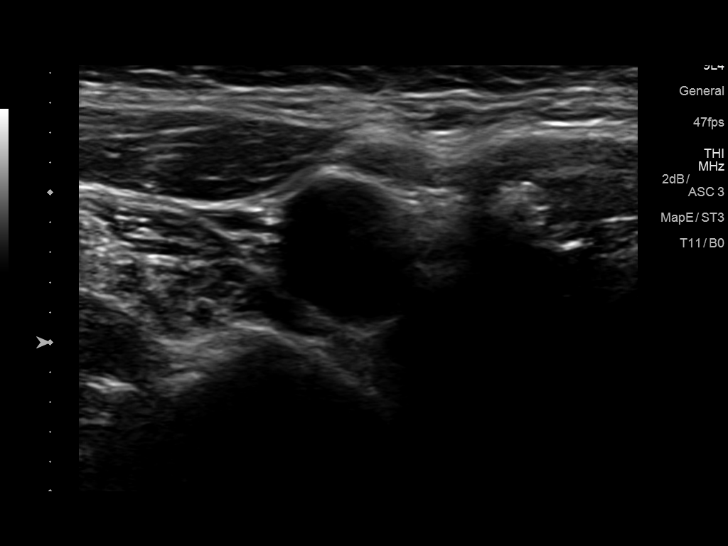
[im 8/46]
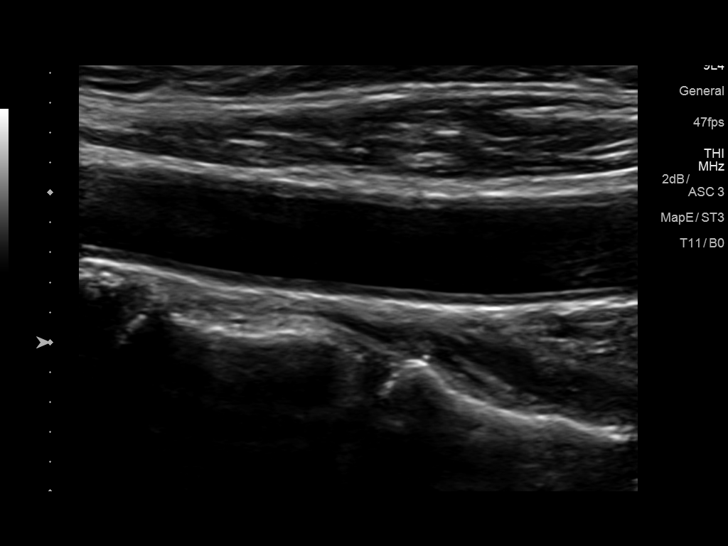
[im 12/46]
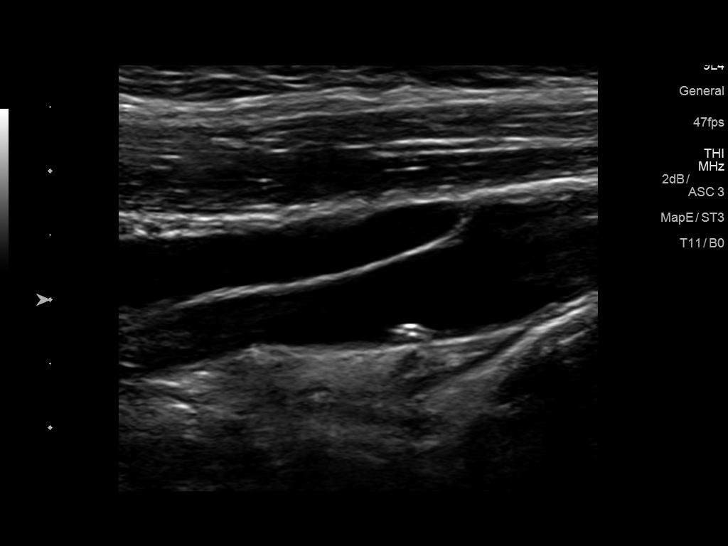
[im 16/46]
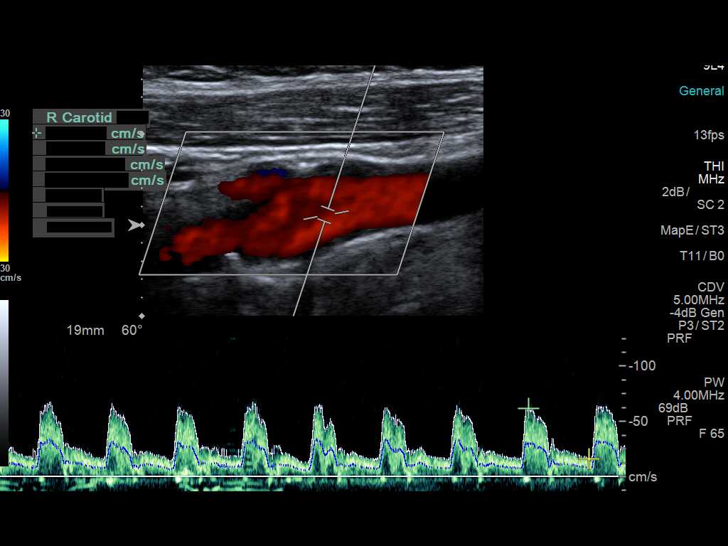
[im 20/46]
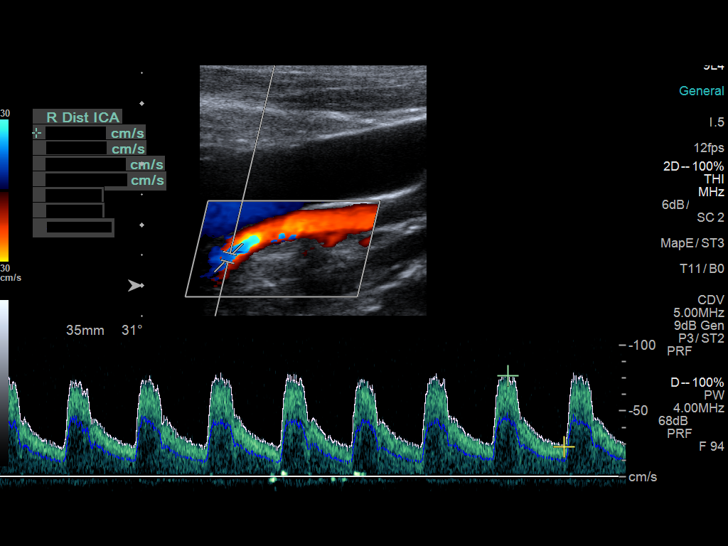
[im 24/46]
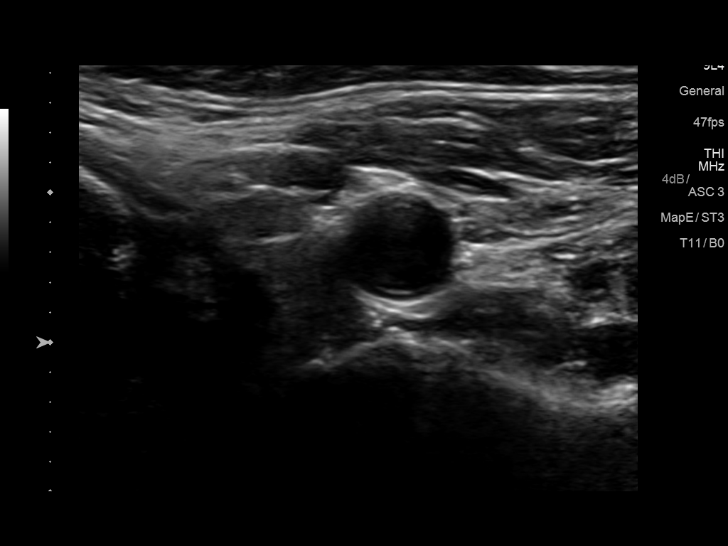
[im 26/46]
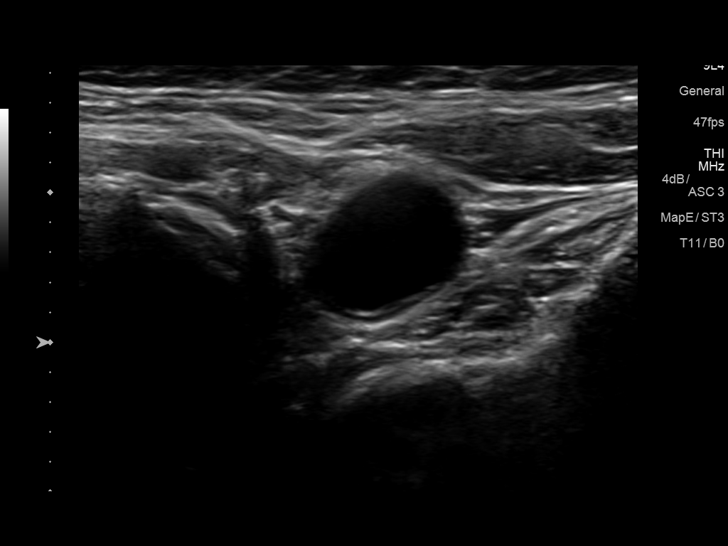
[im 30/46]
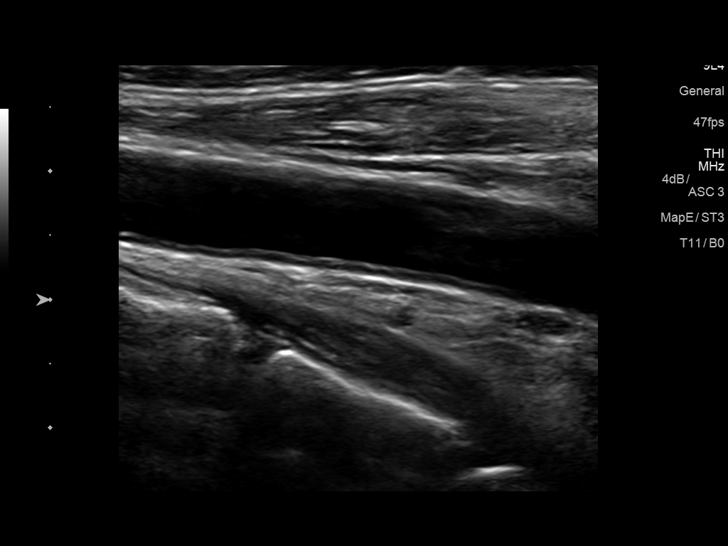
[im 34/46]
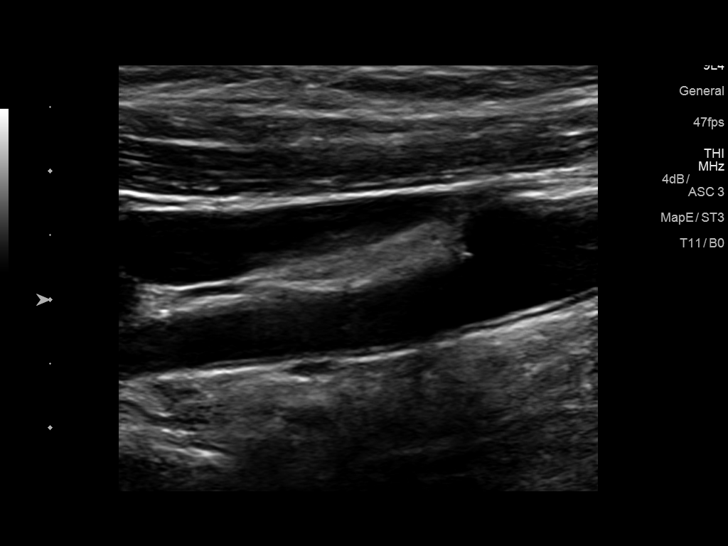
[im 38/46]
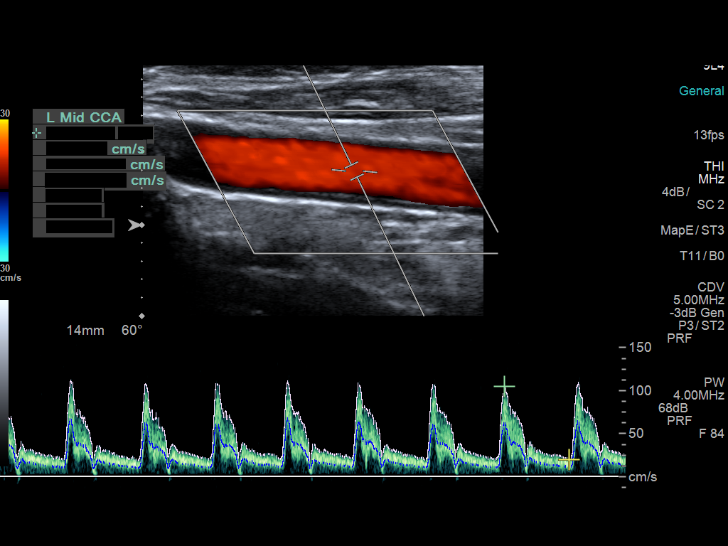
[im 42/46]
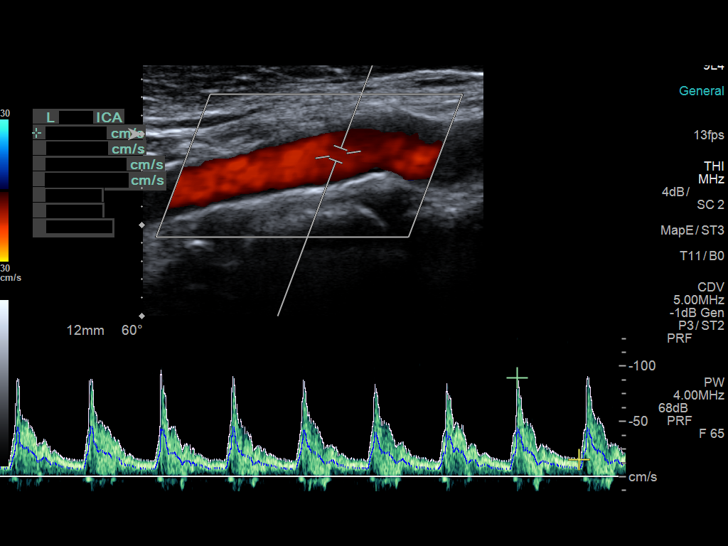
[im 46/46]
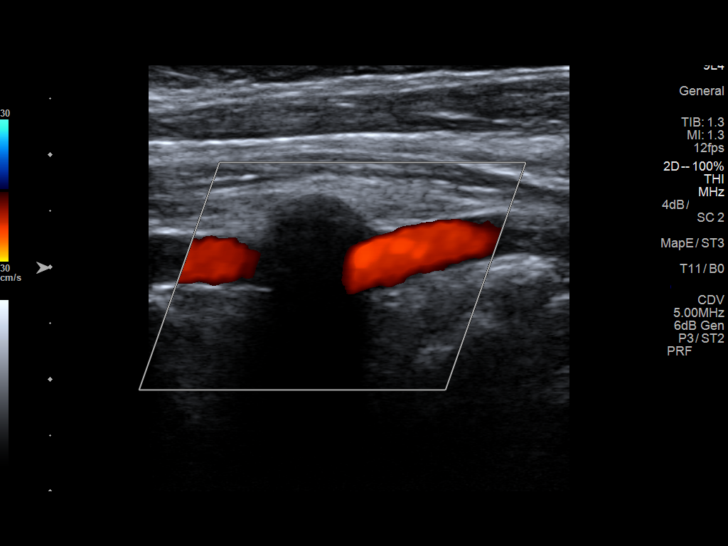

[13 of 24 positions shown; findings below may reference images not displayed]

FINDINGS: Criteria: Quantification of carotid stenosis is based on velocity
parameters that correlate the residual internal carotid diameter
with NASCET-based stenosis levels, using the diameter of the distal
internal carotid lumen as the denominator for stenosis measurement.

The following velocity measurements were obtained:

RIGHT

ICA:  78/17 cm/sec

CCA:  156/23 cm/sec

SYSTOLIC ICA/CCA RATIO:

ECA:  94 cm/sec

LEFT

ICA:  89/15 cm/sec

CCA:  112/19 cm/sec

SYSTOLIC ICA/CCA RATIO:

ECA:  82 cm/sec

RIGHT CAROTID ARTERY: Mild amount of calcified plaque is present at
the level of the carotid bulb and proximal right ICA. No significant
stenosis identified with estimated right ICA stenosis of less than
50%.

RIGHT VERTEBRAL ARTERY: Antegrade flow with normal waveform and
velocity.

LEFT CAROTID ARTERY: Similar mild amount of calcified plaque at the
level of the carotid bulb and proximal left ICA. Estimated left ICA
stenosis is less than 50%.

LEFT VERTEBRAL ARTERY: Antegrade flow with normal waveform and
velocity.
IMPRESSION: Mild amount of calcified plaque at the level of both carotid bulbs
and proximal internal carotid arteries. No significant carotid
stenosis identified with estimated bilateral ICA stenoses of less
than 50%.

## 2021-05-23 ENCOUNTER — Telehealth: Payer: Self-pay | Admitting: Pharmacist

## 2021-05-23 NOTE — Progress Notes (Signed)
° ° °  Chronic Care Management Pharmacy Assistant   Name: Veronica Flores  MRN: [id number] DOB: 12-13-1945  [name] is an 76 y.o. year old   Reason for Encounter: Disease State - Hypertension Call    Recent office visits:  None noted  Recent consult visits:  None noted  Hospital visits:  None in previous 6 months  Medications: Outpatient Encounter Medications as of 05/23/2021  Medication Sig   aspirin EC 81 MG tablet Take 81 mg by mouth daily. Swallow whole.   azaTHIOprine (IMURAN) 50 MG tablet Take 100 mg by mouth daily. 2 pills once a day   Calcium Carbonate-Vitamin D 600-400 MG-UNIT chew tablet Chew 1 tablet by mouth 2 (two) times daily.   docusate sodium (COLACE) 100 MG capsule Take 100 mg by mouth daily.   fluticasone-salmeterol (ADVAIR) 100-50 MCG/ACT AEPB Inhale 1 puff into the lungs 2 (two) times daily.   ipratropium (ATROVENT) 0.06 % nasal spray USE ONE SPRAY NASALLY THREE TIMES DAILY   levocetirizine (XYZAL) 5 MG tablet Take 1 tablet (5 mg total) by mouth every evening.   montelukast (SINGULAIR) 10 MG tablet TAKE 1 TABLET BY MOUTH AT BEDTIME   rosuvastatin (CRESTOR) 20 MG tablet Take 1 tablet (20 mg total) by mouth daily.   valsartan (DIOVAN) 80 MG tablet Take 1 tablet (80 mg total) by mouth daily.   verapamil (CALAN-SR) 240 MG CR tablet TAKE 1 TABLET BY MOUTH DAILY   No facility-administered encounter medications on file as of 05/23/2021.   Current antihypertensive regimen:  Valsartan 80mg  daily Verapamil 240 mg CR  How often are you checking your Blood Pressure?  Patient reported she is checking her blood pressure occasionally.   Current home BP readings: 120-130/70 (pt provided range)   What recent interventions/DTPs have been made by any provider to improve Blood Pressure control since last CPP Visit:  Patient reported she has not had any changes in her current medication regimen.    Any recent hospitalizations or ED visits since last visit with CPP?   Patient has not had any ED visits or hospitalizations since last visit with CPP.   What diet changes have been made to improve Blood Pressure Control?  Patient reported she is cautious with her salt intake and does all the household cooking.   What exercise is being done to improve your Blood Pressure Control?  Patient reported she is active and takes care of her husband who is currently in physical therapy.    Adherence Review: Is the patient currently on ACE/ARB medication? Yes Does the patient have >5 day gap between last estimated fill dates? No   Care Gaps  AWV: done 08/09/20 Colonoscopy: done 11/19/09 DM Eye Exam: N/A DM Foot Exam: N/A Microalbumin: N/A HbgAIC: N/A DEXA: done 07/20/19 Mammogram: done 01/15/21   Star Rating Drugs: Valsartan (DIOVAN) 80 MG tablet - last filled 05/08/21 90 days  Future Appointments  Date Time Provider [place]  08/05/2021  8:00 AM Susy Frizzle, MD [facility] None  09/19/2021  2:15 PM [facility] PHARMACIST [facility] None    Jobe Gibbon, [place] Clinical Pharmacist Assistant  [phone]

## 2021-05-29 ENCOUNTER — Telehealth (INDEPENDENT_AMBULATORY_CARE_PROVIDER_SITE_OTHER): Payer: Medicare Other | Admitting: Nurse Practitioner

## 2021-05-29 ENCOUNTER — Telehealth: Payer: Self-pay

## 2021-05-29 ENCOUNTER — Other Ambulatory Visit: Payer: Self-pay | Admitting: Nurse Practitioner

## 2021-05-29 ENCOUNTER — Encounter: Payer: Self-pay | Admitting: Nurse Practitioner

## 2021-05-29 DIAGNOSIS — R051 Acute cough: Secondary | ICD-10-CM

## 2021-05-29 DIAGNOSIS — U071 COVID-19: Secondary | ICD-10-CM

## 2021-05-29 MED ORDER — ALBUTEROL SULFATE HFA 108 (90 BASE) MCG/ACT IN AERS: 2.0000 | INHALATION_SPRAY | Freq: Four times a day (QID) | RESPIRATORY_TRACT | 0 refills | Status: DC | PRN

## 2021-05-29 MED ORDER — MOLNUPIRAVIR EUA 200MG CAPSULE: 4.0000 | ORAL_CAPSULE | Freq: Two times a day (BID) | ORAL | 0 refills | Status: AC

## 2021-05-29 NOTE — Telephone Encounter (Signed)
Pt called in stating that she has tested positive for covid. Pt states this is the second time she has caught covid. Pt would like to know if she could get some Plaxovid sent to pharmacy. 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 Please contact pt asap please.  Cb#: [phone]

## 2021-05-29 NOTE — Progress Notes (Signed)
Subjective:    Patient ID: Veronica Flores, female    DOB: 01/12/1946, 76 y.o.   MRN: [id number]  HPI: Veronica Flores is a 76 y.o. female presenting virtually for COVID-19 positive.   Chief Complaint  Patient presents with   Covid Positive   COVID-19 Onset: Monday COVID-19 testing history: tested positive  COVID-19 vaccination status: has had all vaccines except most recent booster Fever: low grade; Body aches: no  Chills: no Cough: yes Shortness of breath: no Wheezing: no Chest pain: no Chest tightness: no Chest congestion: yes Nasal congestion: yes Runny nose: yes Post nasal drip: yes Sneezing: no Sore throat: no Swollen glands: no Sinus pressure: yes Headache: yes Face pain: no Toothache: no Ear pain: no  Ear pressure: no  Eyes red/itching:yes; thinks related to allergies Eye drainage/crusting: yes; thinks related to allergies Nausea: no  Vomiting: no Diarrhea: no  Change in appetite:  yes; decreased   Loss of taste/smell: no  Rash: no Fatigue: yes Sick contacts: no Strep contacts: no  Context: stable Recurrent sinusitis: no Treatments attempted: Tussin, dimatap Relief with OTC medications: some  Allergies  Allergen Reactions   Penicillins Anaphylaxis   Allegra [Fexofenadine Hydrochloride]     palpitations   Desloratadine     Palpitations    Cephalexin Hives and Rash    Outpatient Encounter Medications as of 05/29/2021  Medication Sig   albuterol (VENTOLIN HFA) 108 (90 Base) MCG/ACT inhaler Inhale 2 puffs into the lungs every 6 (six) hours as needed for wheezing or shortness of breath.   aspirin EC 81 MG tablet Take 81 mg by mouth daily. Swallow whole.   azaTHIOprine (IMURAN) 50 MG tablet Take 100 mg by mouth daily. 2 pills once a day   Calcium Carbonate-Vitamin D 600-400 MG-UNIT chew tablet Chew 1 tablet by mouth 2 (two) times daily.   docusate sodium (COLACE) 100 MG capsule Take 100 mg by mouth daily.   fluticasone-salmeterol (ADVAIR) 100-50  MCG/ACT AEPB Inhale 1 puff into the lungs 2 (two) times daily.   ipratropium (ATROVENT) 0.06 % nasal spray USE ONE SPRAY NASALLY THREE TIMES DAILY   levocetirizine (XYZAL) 5 MG tablet Take 1 tablet (5 mg total) by mouth every evening.   molnupiravir EUA (LAGEVRIO) 200 mg CAPS capsule Take 4 capsules (800 mg total) by mouth 2 (two) times daily for 5 days.   montelukast (SINGULAIR) 10 MG tablet TAKE 1 TABLET BY MOUTH AT BEDTIME   rosuvastatin (CRESTOR) 20 MG tablet Take 1 tablet (20 mg total) by mouth daily.   valsartan (DIOVAN) 80 MG tablet Take 1 tablet (80 mg total) by mouth daily.   verapamil (CALAN-SR) 240 MG CR tablet TAKE 1 TABLET BY MOUTH DAILY   No facility-administered encounter medications on file as of 05/29/2021.    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 Interim medical history since our last visit reviewed.  Review of Systems Per HPI unless specifically indicated above     Objective:    There were no vitals taken for this visit.  Wt Readings from Last 3 Encounters:  02/04/21 181 lb (82.1 kg)  08/06/20 183 lb (83 kg)  02/06/20 183 lb 3.2 oz (83.1 kg)    Physical Exam Physical examination unable to be  performed due to lack of equipment.  Patient talking in complete sentences during telemedicine visit.     Assessment & Plan:  1. COVID-19 Acute.  Symptoms started 2 days ago - she is high risk for severe disease and therefore qualifies for antiviral.  Start molnupiravir twice daily for 5 days.  Discussed use of Coricidin, guaifenesin for help with symptom management.  With any sudden onset new chest pain, dizziness, sweating, or shortness of breath, go to ED.  - molnupiravir EUA (LAGEVRIO) 200 mg CAPS capsule; Take 4 capsules (800 mg total) by mouth 2 (two) times daily for 5 days.  Dispense: 40 capsule; Refill: 0  2. Acute cough Acute, likely related to viral process.  Given history of asthma, will send in rescue inhaler for patient to have on hand.  She reports her asthma is very well controlled and she has not needed rescue inhaler in years.   - albuterol (VENTOLIN HFA) 108 (90 Base) MCG/ACT inhaler; Inhale 2 puffs into the lungs every 6 (six) hours as needed for wheezing or shortness of breath.  Dispense: 8 g; Refill: 0    Follow up plan: Return if symptoms worsen or fail to improve.   This visit was completed via telephone due to the restrictions of the COVID-19 pandemic. All issues as above were discussed and addressed but no physical exam was performed. If it was felt that the patient should be evaluated in the office, they were directed there. The patient verbally consented to this visit. Patient was unable to complete an audio/visual visit due to Lack of equipment.  Patient without access to video camera on cell phone. Location of the patient: home Location of the provider: work Those involved with this call:  Provider: Noemi Chapel, DNP, FNP-C CMA: Elizabeth Palau, CMA Front Desk/Registration: Santina Evans  Time spent on call:  9 minutes on the phone discussing health concerns. 15 minutes total spent in review of patient's record and preparation of their chart. I verified  patient identity using two factors (patient name and date of birth). Patient consents verbally to being seen via telemedicine visit today.

## 2021-05-29 NOTE — Telephone Encounter (Signed)
Appointment scheduled.

## 2021-06-01 ENCOUNTER — Other Ambulatory Visit: Payer: Self-pay

## 2021-06-01 ENCOUNTER — Encounter: Payer: Self-pay | Admitting: Emergency Medicine

## 2021-06-01 ENCOUNTER — Ambulatory Visit
Admission: EM | Admit: 2021-06-01 | Discharge: 2021-06-01 | Disposition: A | Discharge status: Home / Self Care | Payer: Medicare Other | Attending: Family Medicine | Admitting: Family Medicine

## 2021-06-01 DIAGNOSIS — J392 Other diseases of pharynx: Secondary | ICD-10-CM | POA: Diagnosis not present

## 2021-06-01 DIAGNOSIS — R062 Wheezing: Secondary | ICD-10-CM

## 2021-06-01 DIAGNOSIS — L500 Allergic urticaria: Secondary | ICD-10-CM | POA: Diagnosis not present

## 2021-06-01 MED ORDER — CETIRIZINE HCL 5 MG PO TABS: 5.0000 mg | ORAL_TABLET | Freq: Two times a day (BID) | ORAL | 0 refills | Status: DC

## 2021-06-01 MED ORDER — METHYLPREDNISOLONE SODIUM SUCC 125 MG IJ SOLR
125.0000 mg | Freq: Once | INTRAMUSCULAR | Status: AC
  Administered 2021-06-01: 125 mg via INTRAMUSCULAR

## 2021-06-01 MED ORDER — FAMOTIDINE 40 MG PO TABS: 40.0000 mg | ORAL_TABLET | Freq: Two times a day (BID) | ORAL | 0 refills | Status: DC

## 2021-06-01 MED ORDER — PREDNISONE 20 MG PO TABS: 40.0000 mg | ORAL_TABLET | Freq: Every day | ORAL | 0 refills | Status: DC

## 2021-06-01 NOTE — ED Provider Notes (Signed)
[facility]    CSN: [id number] Arrival date & time: 06/01/21  8413      History   Chief Complaint Chief Complaint  Patient presents with   Allergic Reaction    HPI Veronica Flores is a 76 y.o. female.   Presenting today with 1 day history of rash to chest and abdomen that is itchy, throat itching sensation, possibly so shortness of breath/chest tightness this morning.  She states that she currently has COVID-19 and was started on molnupiravir 3 days ago.  Has never taken this medication before and is wondering if she is allergic to it.  Denies chest pain, shortness of breath, fever, chills, nausea, vomiting, diarrhea.  Took some Benadryl last night before bed, slept well and took some albuterol this morning when she woke up which she feels like helped her breathing.  She does have a history of asthma, seasonal allergies and has had drug allergies to penicillin in the past.   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      Home Medications    Prior to Admission medications   Medication Sig Start Date End Date Taking? Authorizing Provider  cetirizine (ZYRTEC) 5 MG tablet Take 1 tablet (5 mg total) by mouth 2 (two) times daily. 06/01/21  Yes Volney American, PA-C  famotidine (PEPCID) 40 MG tablet Take 1 tablet (40 mg total) by mouth 2 (two) times daily. 06/01/21  Yes Volney American, PA-C  predniSONE (DELTASONE) 20 MG tablet Take 2 tablets (40 mg total) by mouth daily with breakfast. 06/01/21  Yes Volney American, PA-C  albuterol (VENTOLIN HFA) 108 (90 Base) MCG/ACT inhaler Inhale 2 puffs into the lungs every 6 (six) hours as needed for wheezing or shortness of breath. 05/29/21   Eulogio Bear, NP  aspirin EC 81 MG tablet Take 81 mg by mouth daily. Swallow whole.    [provider]  azaTHIOprine (IMURAN) 50 MG tablet Take 100 mg by mouth daily. 2 pills once a day    [provider]  Calcium Carbonate-Vitamin D 600-400 MG-UNIT chew tablet Chew 1 tablet by mouth 2 (two) times daily.    [provider]  docusate sodium (COLACE) 100 MG capsule Take 100 mg by mouth daily.    [provider]  fluticasone-salmeterol (ADVAIR) 100-50 MCG/ACT AEPB Inhale  1 puff into the lungs 2 (two) times daily. 08/31/20   Susy Frizzle, MD  ipratropium (ATROVENT) 0.06 % nasal spray USE ONE SPRAY NASALLY THREE TIMES DAILY 03/27/20   Susy Frizzle, MD  levocetirizine (XYZAL) 5 MG tablet Take 1 tablet (5 mg total) by mouth every evening. 03/12/21   Susy Frizzle, MD  molnupiravir EUA (LAGEVRIO) 200 mg CAPS capsule Take 4 capsules (800 mg total) by mouth 2 (two) times daily for 5 days. 05/29/21 06/03/21  Eulogio Bear, NP  montelukast (SINGULAIR) 10 MG tablet TAKE 1 TABLET BY MOUTH AT BEDTIME 09/16/16   Susy Frizzle, MD  rosuvastatin (CRESTOR) 20 MG tablet Take 1 tablet (20 mg total) by mouth daily. 05/09/20    Susy Frizzle, MD  valsartan (DIOVAN) 80 MG tablet Take 1 tablet (80 mg total) by mouth daily. 02/04/21   Susy Frizzle, MD  verapamil (CALAN-SR) 240 MG CR tablet TAKE 1 TABLET BY MOUTH DAILY 10/23/20   Susy Frizzle, MD    Family History History reviewed. No pertinent family history.  Social History Social History   Tobacco Use   Smoking status: Never   Smokeless tobacco: Never  Substance Use Topics   Alcohol use: No   Drug use: No     Allergies   Penicillins, Allegra [fexofenadine hydrochloride], Desloratadine, Cephalexin, and Molnupiravir   Review of Systems Review of Systems Per HPI  Physical Exam Triage Vital Signs ED Triage Vitals  Enc Vitals Group     BP 06/01/21 0850 (!) 179/91     Pulse Rate 06/01/21 0850 (!) 101     Resp 06/01/21 0850 18     Temp 06/01/21 0850 98.2 F (36.8 C)     Temp Source 06/01/21 0850 Oral     SpO2 06/01/21 0850 98 %     Weight 06/01/21 0851 170 lb (77.1 kg)     Height 06/01/21 0851 5\' 7"  (1.702 m)     Head Circumference --      Peak Flow --      Pain Score 06/01/21 0851 0     Pain Loc --      Pain Edu? --      Excl. in [place]? --    No data found.  Updated Vital Signs BP (!) 179/91 (BP Location: Right Arm)    Pulse (!) 101    Temp 98.2 F (36.8 C) (Oral)    Resp 18    Ht 5\' 7"  (1.702 m)    Wt 170 lb (77.1 kg)    SpO2 98%    BMI 26.63 kg/m   Visual Acuity Right Eye Distance:   Left Eye Distance:   Bilateral Distance:    Right Eye Near:   Left Eye Near:    Bilateral Near:     Physical Exam Vitals and nursing note reviewed.  Constitutional:      Appearance: Normal appearance. She is not ill-appearing.  HENT:     Head: Atraumatic.     Right Ear: Tympanic membrane normal.     Left Ear: Tympanic membrane normal.     Nose: Nose normal.     Mouth/Throat:     Mouth: Mucous membranes are moist.     Pharynx: Oropharynx is clear. No posterior oropharyngeal erythema.  Eyes:     Extraocular Movements: Extraocular  movements intact.     Conjunctiva/sclera: Conjunctivae normal.  Cardiovascular:     Rate and Rhythm: Normal rate and regular rhythm.  Heart sounds: Normal heart sounds.  Pulmonary:     Effort: Pulmonary effort is normal.     Breath sounds: Normal breath sounds. No wheezing or rales.  Abdominal:     General: Bowel sounds are normal. There is no distension.     Palpations: Abdomen is soft.     Tenderness: There is no abdominal tenderness. There is no guarding.  Musculoskeletal:        General: Normal range of motion.     Cervical back: Normal range of motion and neck supple.  Skin:    General: Skin is warm and dry.     Findings: Rash present.     Comments: Urticarial rash across abdomen, chest  Neurological:     Mental Status: She is alert and oriented to person, place, and time.     Motor: No weakness.     Gait: Gait normal.  Psychiatric:        Mood and Affect: Mood normal.        Thought Content: Thought content normal.        Judgment: Judgment normal.     UC Treatments / Results  Labs (all labs ordered are listed, but only abnormal results are displayed) Labs Reviewed - No data to display  EKG   Radiology No results found.  Procedures Procedures (including critical care time)  Medications Ordered in UC Medications  methylPREDNISolone sodium succinate (SOLU-MEDROL) 125 mg/2 mL injection 125 mg (125 mg Intramuscular Given 06/01/21 0901)    Initial Impression / Assessment and Plan / UC Course  I have reviewed the triage vital signs and the nursing notes.  Pertinent labs & imaging results that were available during my care of the patient were reviewed by me and considered in my medical decision making (see chart for details).     Possibly drug reaction versus hypersensitivity rash to another trigger.  We will treat with IM Solu-Medrol, prednisone, Pepcid and Zyrtec twice daily.  Discussed to DC the molnupiravir in case this is causing the reaction.  ED for any  worsening or progressive symptoms, no evidence of anaphylaxis today but she is aware of what to watch for for that.  Follow-up with PCP next week for recheck.  Final Clinical Impressions(s) / UC Diagnoses   Final diagnoses:  Allergic urticaria  Irritated throat  Wheezing   Discharge Instructions   None    ED Prescriptions     Medication Sig Dispense Auth. Provider   famotidine (PEPCID) 40 MG tablet Take 1 tablet (40 mg total) by mouth 2 (two) times daily. 20 tablet Volney American, [place]   cetirizine (ZYRTEC) 5 MG tablet Take 1 tablet (5 mg total) by mouth 2 (two) times daily. 20 tablet Volney American, [place]   predniSONE (DELTASONE) 20 MG tablet Take 2 tablets (40 mg total) by mouth daily with breakfast. 10 tablet Volney American, [place]      PDMP not reviewed this encounter.   Volney American, [place] 06/01/21 1236

## 2021-06-01 NOTE — ED Triage Notes (Signed)
Pt reports tested positive for covid on Tuesday. Pt reports started medication on Wednesday and reports last night started having itching sensation and reports this morning woke up with generalized rash noted to chest/torso, upper extremity. Pt reports shortness of breath. Airway patent. Pt reports bilateral hands tingling and lips tingling. Pt able to speak clear complete sentences. Mild dyspnea noted with exertion. Pt reports took benadryl last night and albuterol x1 hour ago.  Pt reports has had covid before and reports no problem with paxlovid but reports these symptoms started with lagevrio.

## 2021-06-02 ENCOUNTER — Other Ambulatory Visit: Payer: Self-pay | Admitting: Family Medicine

## 2021-06-02 DIAGNOSIS — E7800 Pure hypercholesterolemia, unspecified: Secondary | ICD-10-CM

## 2021-07-08 ENCOUNTER — Telehealth: Payer: Self-pay | Admitting: Pharmacist

## 2021-07-08 DIAGNOSIS — M25561 Pain in right knee: Secondary | ICD-10-CM | POA: Diagnosis not present

## 2021-07-08 NOTE — Progress Notes (Signed)
? ? ?Chronic Care Management ?Pharmacy Assistant  ? ?Name: Veronica Flores  MRN: [id number] DOB: 12-17-1945 ? ? ? ?Reason for Encounter: Disease State - Hypertension Call  ?  ? ? ?Recent office visits:  ?05/29/21 Noemi Chapel, NP - Family Medicine (Video visit) - COVID 19 - albuterol (VENTOLIN HFA) 108 (90 Base) MCG/ACT inhaler and molnupiravir EUA (LAGEVRIO) 200 mg CAPS capsule prescribed. Follow up if no improvement.  ? ?Recent consult visits:  ?None noted.  ? ?Hospital visits: 06/01/21 ?Medication Reconciliation was completed by comparing discharge summary, patient?s EMR and Pharmacy list, and upon discussion with patient. ? ?Admitted to the hospital on 06/01/21 due to Allergic Urticaria. Discharge date was 06/01/21. Discharged from [facility] in [place].   ? ?New?Medications Started at [facility] Discharge:?? ?cetirizine (ZYRTEC) 5 MG tablet ?famotidine (PEPCID) 40 MG tablet ?predniSONE (DELTASONE) 20 MG tablet ? ?Medication Changes at Hospital Discharge: ?None noted.  ? ?Medications Discontinued at Hospital Discharge: ?None noted.  ? ?Medications that remain the same after Hospital Discharge:??  ?All other medications will remain the same.   ? ? ?Medications: ?Outpatient Encounter Medications as of 07/08/2021  ?Medication Sig  ? albuterol (VENTOLIN HFA) 108 (90 Base) MCG/ACT inhaler Inhale 2 puffs into the lungs every 6 (six) hours as needed for wheezing or shortness of breath.  ? aspirin EC 81 MG tablet Take 81 mg by mouth daily. Swallow whole.  ? azaTHIOprine (IMURAN) 50 MG tablet Take 100 mg by mouth daily. 2 pills once a day  ? Calcium Carbonate-Vitamin D 600-400 MG-UNIT chew tablet Chew 1 tablet by mouth 2 (two) times daily.  ? cetirizine (ZYRTEC) 5 MG tablet Take 1 tablet (5 mg total) by mouth 2 (two) times daily.  ? docusate sodium (COLACE) 100 MG capsule Take 100 mg by mouth daily.  ? famotidine (PEPCID) 40 MG tablet Take 1 tablet (40 mg total) by mouth 2 (two) times daily.  ? fluticasone-salmeterol  (ADVAIR) 100-50 MCG/ACT AEPB Inhale 1 puff into the lungs 2 (two) times daily.  ? ipratropium (ATROVENT) 0.06 % nasal spray USE ONE SPRAY NASALLY THREE TIMES DAILY  ? levocetirizine (XYZAL) 5 MG tablet Take 1 tablet (5 mg total) by mouth every evening.  ? montelukast (SINGULAIR) 10 MG tablet TAKE 1 TABLET BY MOUTH AT BEDTIME  ? predniSONE (DELTASONE) 20 MG tablet Take 2 tablets (40 mg total) by mouth daily with breakfast.  ? rosuvastatin (CRESTOR) 20 MG tablet TAKE 1 TABLET BY MOUTH DAILY  ? valsartan (DIOVAN) 80 MG tablet Take 1 tablet (80 mg total) by mouth daily.  ? verapamil (CALAN-SR) 240 MG CR tablet TAKE 1 TABLET BY MOUTH DAILY  ? ?No facility-administered encounter medications on file as of 07/08/2021.  ? ? ?Current antihypertensive regimen:  ?Valsartan '80mg'$  daily 1 tablet daily in am  ?Verapamil 240 mg CR 1 tablet daily in pm  ? ?How often are you checking your Blood Pressure?  ?Patient reported she has been checking her blood pressure in the last week as she has been busy taking care of her husband. ? ? ?Current home BP readings: 130/70 (range reported by patient) ? ? ?What recent interventions/DTPs have been made by any provider to improve Blood Pressure control since last CPP Visit:  ?Patient reported she has not had any changes in her current medication regimen.  ? ? ?Any recent hospitalizations or ED visits since last visit with CPP?  ?Patient did have an ED visit on 06/01/21 for Allergic urticaria. ? ? ?What diet changes  have been made to improve Blood Pressure Control?  ?Patient reported she tries to limit her salt in her diet and she does all the cooking at home.  ? ? ?What exercise is being done to improve your Blood Pressure Control?  ?Patient is active taking care of her husband who has Parkinson's disease.  ? ? ? ?Adherence Review: ?Is the patient currently on ACE/ARB medication? Yes ?Does the patient have >5 day gap between last estimated fill dates? No ? ? ?Care Gaps ?  ?AWV: done  08/09/20 ?Colonoscopy: done 11/19/09 ?DM Eye Exam: N/A ?DM Foot Exam: N/A ?Microalbumin: N/A ?HbgAIC: N/A ?DEXA: done 07/20/19 ?Mammogram: done 01/15/21 ? ? ?Star Rating Drugs: ?valsartan (DIOVAN) 80 MG tablet - last filled 05/08/21 90 days  ?rosuvastatin (CRESTOR) 20 MG tablet - last filled 06/02/21 90 days  ? ? ?Future Appointments  ?Date Time Provider [place]  ?08/05/2021  8:00 AM Pickard, Cammie Mcgee, MD [facility]  ? ? ? ?Liza Showfety, CCMA ?Clinical Pharmacist Assistant  ?[phone] ? ?

## 2021-07-12 ENCOUNTER — Other Ambulatory Visit: Payer: Self-pay | Admitting: Family Medicine

## 2021-07-12 ENCOUNTER — Encounter: Payer: Self-pay | Admitting: Family Medicine

## 2021-07-12 MED ORDER — MELOXICAM 15 MG PO TABS: 15.0000 mg | ORAL_TABLET | Freq: Every day | ORAL | 1 refills | Status: DC

## 2021-08-05 ENCOUNTER — Encounter: Payer: Self-pay | Admitting: Family Medicine

## 2021-08-05 ENCOUNTER — Ambulatory Visit (INDEPENDENT_AMBULATORY_CARE_PROVIDER_SITE_OTHER): Payer: Medicare Other | Admitting: Family Medicine

## 2021-08-05 VITALS — BP 130/82 | HR 72 | Temp 96.8°F | Ht 67.0 in | Wt 182.8 lb

## 2021-08-05 DIAGNOSIS — I421 Obstructive hypertrophic cardiomyopathy: Secondary | ICD-10-CM | POA: Diagnosis not present

## 2021-08-05 DIAGNOSIS — E7800 Pure hypercholesterolemia, unspecified: Secondary | ICD-10-CM

## 2021-08-05 DIAGNOSIS — Z780 Asymptomatic menopausal state: Secondary | ICD-10-CM

## 2021-08-05 DIAGNOSIS — I10 Essential (primary) hypertension: Secondary | ICD-10-CM

## 2021-08-05 DIAGNOSIS — K754 Autoimmune hepatitis: Secondary | ICD-10-CM

## 2021-08-05 DIAGNOSIS — M8580 Other specified disorders of bone density and structure, unspecified site: Secondary | ICD-10-CM

## 2021-08-05 LAB — CBC WITH DIFFERENTIAL/PLATELET
Absolute Monocytes: 311 cells/uL (ref 200–950)
Basophils Absolute: 9 cells/uL (ref 0–200)
Basophils Relative: 0.2 %
Eosinophils Absolute: 9 cells/uL — ABNORMAL LOW (ref 15–500)
Eosinophils Relative: 0.2 %
HCT: 46 % — ABNORMAL HIGH (ref 35.0–45.0)
Hemoglobin: 15.2 g/dL (ref 11.7–15.5)
Lymphs Abs: 1337 cells/uL (ref 850–3900)
MCH: 29.5 pg (ref 27.0–33.0)
MCHC: 33 g/dL (ref 32.0–36.0)
MCV: 89.3 fL (ref 80.0–100.0)
MPV: 9.8 fL (ref 7.5–12.5)
Monocytes Relative: 6.9 %
Neutro Abs: 2835 cells/uL (ref 1500–7800)
Neutrophils Relative %: 63 %
Platelets: 290 10*3/uL (ref 140–400)
RBC: 5.15 10*6/uL — ABNORMAL HIGH (ref 3.80–5.10)
RDW: 14 % (ref 11.0–15.0)
Total Lymphocyte: 29.7 %
WBC: 4.5 10*3/uL (ref 3.8–10.8)

## 2021-08-05 LAB — COMPLETE METABOLIC PANEL WITH GFR
AG Ratio: 1.6 (calc) (ref 1.0–2.5)
ALT: 15 U/L (ref 6–29)
AST: 21 U/L (ref 10–35)
Albumin: 4.4 g/dL (ref 3.6–5.1)
Alkaline phosphatase (APISO): 48 U/L (ref 37–153)
BUN: 15 mg/dL (ref 7–25)
CO2: 22 mmol/L (ref 20–32)
Calcium: 9.7 mg/dL (ref 8.6–10.4)
Chloride: 108 mmol/L (ref 98–110)
Creat: 0.87 mg/dL (ref 0.60–1.00)
Globulin: 2.7 g/dL (calc) (ref 1.9–3.7)
Glucose, Bld: 92 mg/dL (ref 65–99)
Potassium: 4.1 mmol/L (ref 3.5–5.3)
Sodium: 140 mmol/L (ref 135–146)
Total Bilirubin: 0.6 mg/dL (ref 0.2–1.2)
Total Protein: 7.1 g/dL (ref 6.1–8.1)
eGFR: 69 mL/min/{1.73_m2} (ref >=60)

## 2021-08-05 LAB — LIPID PANEL
Cholesterol: 102 mg/dL (ref <200)
HDL: 40 mg/dL — ABNORMAL LOW (ref >=50)
LDL Cholesterol (Calc): 43 mg/dL (calc)
Non-HDL Cholesterol (Calc): 62 mg/dL (calc) (ref <130)
Total CHOL/HDL Ratio: 2.6 (calc) (ref <5.0)
Triglycerides: 107 mg/dL (ref <150)

## 2021-08-05 NOTE — Progress Notes (Signed)
? ?Subjective:  ? ? Patient ID: Veronica Flores, female    DOB: 1946/03/25, 76 y.o.   MRN: [id number] ? ? ?Patient is a very pleasant 76 year old Caucasian female who presents today for a checkup.  Her mammogram is up-to-date.  Her colonoscopy is up-to-date.  Her last colonoscopy was in 2021 and is not due again for 5 years.  She does not require a Pap smear.  However her last bone density was 2021 and she had osteopenia.  She is due to recheck her bone density test.  Her blood pressure today is outstanding at 130/82.  She denies any chest pain or shortness of breath or dyspnea on exertion.  She is dealing with a lot of stress caring for her husband who has severe Parkinson's deformities.  This seems to be her biggest medical issue at the present time. ?Past Medical History:  ?Diagnosis Date  ? Allergy   ? Asthma   ? Asthma   ? Phreesia 02/27/2020  ? Autoimmune hepatitis ([place])   ? Fatigue   ? Hypertension   ? Hypertrophic cardiomyopathy ([place])   ? Meningioma ([place])   ? s/p craniotomy and removal from right medial sphenoid wing extending into cavernous sinus  ? Migraine, ophthalmoplegic   ? Osteopenia   ? ?Past Surgical History:  ?Procedure Laterality Date  ? ANKLE SURGERY    ? BRAIN SURGERY    ? CHOLECYSTECTOMY    ? FRACTURE SURGERY N/A   ? Phreesia 02/27/2020  ? IR RADIOLOGIST EVAL & MGMT  08/26/2016  ? IR VERTEBROPLASTY LUMBAR BX INC UNI/BIL INC/INJECT/IMAGING  07/30/2016  ? WRIST SURGERY    ? ?Current Outpatient Medications on File Prior to Visit  ?Medication Sig Dispense Refill  ? meloxicam (MOBIC) 15 MG tablet Take 1 tablet (15 mg total) by mouth daily. 90 tablet 1  ? albuterol (VENTOLIN HFA) 108 (90 Base) MCG/ACT inhaler Inhale 2 puffs into the lungs every 6 (six) hours as needed for wheezing or shortness of breath. 8 g 0  ? aspirin EC 81 MG tablet Take 81 mg by mouth daily. Swallow whole.    ? azaTHIOprine (IMURAN) 50 MG tablet Take 100 mg by mouth daily. 2 pills once a day    ? Calcium Carbonate-Vitamin D 600-400  MG-UNIT chew tablet Chew 1 tablet by mouth 2 (two) times daily.    ? cetirizine (ZYRTEC) 5 MG tablet Take 1 tablet (5 mg total) by mouth 2 (two) times daily. 20 tablet 0  ? docusate sodium (COLACE) 100 MG capsule Take 100 mg by mouth daily.    ? famotidine (PEPCID) 40 MG tablet Take 1 tablet (40 mg total) by mouth 2 (two) times daily. 20 tablet 0  ? fluticasone-salmeterol (ADVAIR) 100-50 MCG/ACT AEPB Inhale 1 puff into the lungs 2 (two) times daily. 180 each 3  ? ipratropium (ATROVENT) 0.06 % nasal spray USE ONE SPRAY NASALLY THREE TIMES DAILY 45 mL 3  ? levocetirizine (XYZAL) 5 MG tablet Take 1 tablet (5 mg total) by mouth every evening. 90 tablet 3  ? montelukast (SINGULAIR) 10 MG tablet TAKE 1 TABLET BY MOUTH AT BEDTIME 90 tablet 3  ? predniSONE (DELTASONE) 20 MG tablet Take 2 tablets (40 mg total) by mouth daily with breakfast. 10 tablet 0  ? rosuvastatin (CRESTOR) 20 MG tablet TAKE 1 TABLET BY MOUTH DAILY 90 tablet 1  ? valsartan (DIOVAN) 80 MG tablet Take 1 tablet (80 mg total) by mouth daily. 90 tablet 3  ? verapamil (CALAN-SR) 240 MG  CR tablet TAKE 1 TABLET BY MOUTH DAILY 90 tablet 3  ? ?No current facility-administered medications on file prior to visit.  ? ?Allergies  ?Allergen Reactions  ? Penicillins Anaphylaxis  ? Allegra [Fexofenadine Hydrochloride]   ?  palpitations  ? Desloratadine   ?  Palpitations ?  ? Cephalexin Hives and Rash  ? Molnupiravir Rash  ? ?Social History  ? ?Socioeconomic History  ? Marital status: Married  ?  Spouse name: Not on file  ? Number of children: Not on file  ? Years of education: Not on file  ? Highest education level: Not on file  ?Occupational History  ? Not on file  ?Tobacco Use  ? Smoking status: Never  ? Smokeless tobacco: Never  ?Substance and Sexual Activity  ? Alcohol use: No  ? Drug use: No  ? Sexual activity: Not on file  ?Other Topics Concern  ? Not on file  ?Social History Narrative  ? Not on file  ? ?Social Determinants of Health  ? ?Financial Resource Strain:  Not on file  ?Food Insecurity: Not on file  ?Transportation Needs: Not on file  ?Physical Activity: Not on file  ?Stress: Not on file  ?Social Connections: Not on file  ?Intimate Partner Violence: Not on file  ? ? ? ?Review of Systems  ?All other systems reviewed and are negative. ? ?   ?Objective:  ? Physical Exam ?Vitals reviewed.  ?Constitutional:   ?   General: She is not in acute distress. ?   Appearance: She is well-developed. She is not diaphoretic.  ?HENT:  ?   Head: Normocephalic and atraumatic.  ?   Nose: Nose normal.  ?   Mouth/Throat:  ?   Pharynx: No oropharyngeal exudate.  ?Eyes:  ?   Conjunctiva/sclera: Conjunctivae normal.  ?Cardiovascular:  ?   Rate and Rhythm: Normal rate and regular rhythm.  ?   Heart sounds: Normal heart sounds. No murmur heard. ?Pulmonary:  ?   Effort: Pulmonary effort is normal. No respiratory distress.  ?   Breath sounds: Normal breath sounds. No wheezing or rales.  ?Abdominal:  ?   General: Bowel sounds are normal. There is no distension.  ?   Palpations: Abdomen is soft.  ?   Tenderness: There is no abdominal tenderness. There is no guarding or rebound.  ?Musculoskeletal:  ?   Cervical back: Neck supple.  ?Lymphadenopathy:  ?   Cervical: No cervical adenopathy.  ?Neurological:  ?   Mental Status: She is alert and oriented to person, place, and time.  ?   Cranial Nerves: No cranial nerve deficit.  ?   Motor: No abnormal muscle tone.  ?   Coordination: Coordination normal.  ?   Deep Tendon Reflexes: Reflexes are normal and symmetric.  ?Psychiatric:     ?   Behavior: Behavior normal.     ?   Thought Content: Thought content normal.     ?   Judgment: Judgment normal.  ? ? ? ? ? ?   ?Assessment & Plan:  ?Pure hypercholesterolemia - Plan: CBC with Differential/Platelet, Lipid panel, COMPLETE METABOLIC PANEL WITH GFR ? ?Autoimmune hepatitis ([place]) ? ?Primary hypertension ? 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 ?Osteopenia, unspecified location - Plan: DG Bone  Density ? ?Postmenopausal estrogen deficiency - Plan: DG Bone Density ?She denies any depression but I do feel like she is under a lot of stress.  Her blood pressure today is outstanding.  I will check a CBC a CMP and  lipid panel.  Ideally I like her LDL cholesterol to be below 100.  I will schedule the patient for a bone density test to monitor her osteopenia.  I recommended 1200 mg a day of calcium and 1000 units a day of vitamin D.  Her immunizations, mammogram, and colonoscopy are up-to-date. ?

## 2021-08-15 ENCOUNTER — Ambulatory Visit (INDEPENDENT_AMBULATORY_CARE_PROVIDER_SITE_OTHER): Payer: Medicare Other

## 2021-08-15 VITALS — Ht 67.0 in | Wt 182.0 lb

## 2021-08-15 DIAGNOSIS — Z0000 Encounter for general adult medical examination without abnormal findings: Secondary | ICD-10-CM

## 2021-08-15 NOTE — Progress Notes (Signed)
? ?Subjective:  ? Veronica Flores is a 76 y.o. female who presents for Medicare Annual (Subsequent) preventive examination. ?Virtual Visit via Telephone Note ? ?I connected with  Gibson Ramp on 08/15/21 at  9:00 AM EDT by telephone and verified that I am speaking with the correct person using two identifiers. ? ?Location: ?Patient: HOME ?Provider: [facility] ?Persons participating in the virtual visit: patient/Nurse Health Advisor ?  ?I discussed the limitations, risks, security and privacy concerns of performing an evaluation and management service by telephone and the availability of in person appointments. The patient expressed understanding and agreed to proceed. ? ?Interactive audio and video telecommunications were attempted between this nurse and patient, however failed, due to patient having technical difficulties OR patient did not have access to video capability.  We continued and completed visit with audio only. ? ?Some vital signs may be absent or patient reported.  ? ?Chriss Driver, LPN ? ?Review of Systems    ? ?Cardiac Risk Factors include: advanced age (>56mn, >>23women);hypertension;sedentary lifestyle;Other (see comment), Risk factor comments: Cardiomyopathy, Asthma ? ?   ?Objective:  ?  ?Today's Vitals  ? 08/15/21 0853  ?Weight: 182 lb (82.6 kg)  ?Height: '5\' 7"'$  (1.702 m)  ? ?Body mass index is 28.51 kg/m?. ? ? ?  08/15/2021  ?  9:06 AM 08/09/2020  ? 11:44 AM 07/30/2016  ? 10:35 AM  ?Advanced Directives  ?Does Patient Have a Medical Advance Directive? Yes Yes Yes  ?Type of AParamed[place]of [place] will  Living will  ?Does patient want to make changes to medical advance directive?   No - Patient declined  ?Copy of [place]? No - copy requested    ? ? ?Current Medications (verified) ?Outpatient Encounter Medications as of 08/15/2021  ?Medication Sig  ? albuterol (VENTOLIN HFA) 108 (90 Base) MCG/ACT inhaler Inhale 2 puffs into the lungs every 6  (six) hours as needed for wheezing or shortness of breath.  ? aspirin EC 81 MG tablet Take 81 mg by mouth daily. Swallow whole.  ? azaTHIOprine (IMURAN) 50 MG tablet Take 100 mg by mouth daily. 2 pills once a day  ? Calcium Carbonate-Vitamin D 600-400 MG-UNIT chew tablet Chew 1 tablet by mouth 2 (two) times daily.  ? cetirizine (ZYRTEC) 5 MG tablet Take 1 tablet (5 mg total) by mouth 2 (two) times daily.  ? docusate sodium (COLACE) 100 MG capsule Take 100 mg by mouth daily.  ? famotidine (PEPCID) 40 MG tablet Take 1 tablet (40 mg total) by mouth 2 (two) times daily.  ? fluticasone-salmeterol (ADVAIR) 100-50 MCG/ACT AEPB Inhale 1 puff into the lungs 2 (two) times daily.  ? ipratropium (ATROVENT) 0.06 % nasal spray USE ONE SPRAY NASALLY THREE TIMES DAILY  ? levocetirizine (XYZAL) 5 MG tablet Take 1 tablet (5 mg total) by mouth every evening.  ? meloxicam (MOBIC) 15 MG tablet Take 1 tablet (15 mg total) by mouth daily.  ? montelukast (SINGULAIR) 10 MG tablet TAKE 1 TABLET BY MOUTH AT BEDTIME  ? PREVIDENT 1.1 % GEL dental gel Place onto teeth daily.  ? rosuvastatin (CRESTOR) 20 MG tablet TAKE 1 TABLET BY MOUTH DAILY  ? valsartan (DIOVAN) 80 MG tablet Take 1 tablet (80 mg total) by mouth daily.  ? verapamil (CALAN-SR) 240 MG CR tablet TAKE 1 TABLET BY MOUTH DAILY  ? ?No facility-administered encounter medications on file as of 08/15/2021.  ? ? ?Allergies (verified) ?Penicillins, Allegra [fexofenadine hydrochloride],  Desloratadine, Cephalexin, and Molnupiravir  ? ?History: ?Past Medical History:  ?Diagnosis Date  ? Allergy   ? Asthma   ? Asthma   ? Phreesia 02/27/2020  ? Autoimmune hepatitis ([place])   ? Fatigue   ? Hypertension   ? Hypertrophic cardiomyopathy ([place])   ? Meningioma ([place])   ? s/p craniotomy and removal from right medial sphenoid wing extending into cavernous sinus  ? Migraine, ophthalmoplegic   ? Osteopenia   ? ?Past Surgical History:  ?Procedure Laterality Date  ? ANKLE SURGERY    ? BRAIN SURGERY    ?  CHOLECYSTECTOMY    ? FRACTURE SURGERY N/A   ? Phreesia 02/27/2020  ? IR RADIOLOGIST EVAL & MGMT  08/26/2016  ? IR VERTEBROPLASTY LUMBAR BX INC UNI/BIL INC/INJECT/IMAGING  07/30/2016  ? WRIST SURGERY    ? ?No family history on file. ?Social History  ? ?Socioeconomic History  ? Marital status: Married  ?  Spouse name: Not on file  ? Number of children: Not on file  ? Years of education: Not on file  ? Highest education level: Not on file  ?Occupational History  ? Not on file  ?Tobacco Use  ? Smoking status: Never  ? Smokeless tobacco: Never  ?Substance and Sexual Activity  ? Alcohol use: No  ? Drug use: No  ? Sexual activity: Not on file  ?Other Topics Concern  ? Not on file  ?Social History Narrative  ? Not on file  ? ?Social Determinants of Health  ? ?Financial Resource Strain: Low Risk   ? Difficulty of Paying Living Expenses: Not very hard  ?Food Insecurity: No Food Insecurity  ? Worried About Charity fundraiser in the Last Year: Never true  ? Ran Out of Food in the Last Year: Never true  ?Transportation Needs: No Transportation Needs  ? Lack of Transportation (Medical): No  ? Lack of Transportation (Non-Medical): No  ?Physical Activity: Sufficiently Active  ? Days of Exercise per Week: 5 days  ? Minutes of Exercise per Session: 30 min  ?Stress: Stress Concern Present  ? Feeling of Stress : Rather much  ?Social Connections: Socially Integrated  ? Frequency of Communication with Friends and Family: More than three times a week  ? Frequency of Social Gatherings with Friends and Family: More than three times a week  ? Attends Religious Services: More than 4 times per year  ? Active Member of Clubs or Organizations: Yes  ? Attends Archivist Meetings: More than 4 times per year  ? Marital Status: Married  ? ? ?Tobacco Counseling ?Counseling given: Not Answered ? ? ?Clinical Intake: ? ?Pre-visit preparation completed: Yes ? ?Pain : No/denies pain ? ?  ? ?BMI - recorded: 28.51 ?Nutritional Status: BMI 25 -29  Overweight ?Nutritional Risks: None ?Diabetes: No ? ?How often do you need to have someone help you when you read instructions, pamphlets, or other written materials from your doctor or pharmacy?: 1 - Never ? ?Diabetic?NO ? ?Interpreter Needed?: No ? ?Information entered by :: mj Tahni Porchia, lpn ? ? ?Activities of Daily Living ? ?  08/15/2021  ?  9:09 AM  ?In your present state of health, do you have any difficulty performing the following activities:  ?Hearing? 0  ?Vision? 0  ?Difficulty concentrating or making decisions? 1  ?Comment Memory at times.  ?Walking or climbing stairs? 0  ?Dressing or bathing? 0  ?Doing errands, shopping? 0  ?Preparing Food and eating ? N  ?Using the Toilet? N  ?  In the past six months, have you accidently leaked urine? N  ?Do you have problems with loss of bowel control? N  ?Managing your Medications? N  ?Managing your Finances? N  ?Housekeeping or managing your Housekeeping? N  ? ? ?Patient Care Team: ?Susy Frizzle, MD as PCP - General (Family Medicine) ?Edythe Clarity, [facility] as Pharmacist (Pharmacist) ? ?Indicate any recent Medical Services you may have received from other than [facility] providers in the past year (date may be approximate). ? ?   ?Assessment:  ? This is a routine wellness examination for [place]. ? ?Hearing/Vision screen ?Hearing Screening - Comments:: No hearing issues.  ?Vision Screening - Comments:: [facility]. 2022. ? ?Dietary issues and exercise activities discussed: ?Current Exercise Habits: Home exercise routine, Type of exercise: walking, Time (Minutes): 30, Frequency (Times/Week): 5, Weekly Exercise (Minutes/Week): 150, Intensity: Mild, Exercise limited by: cardiac condition(s);orthopedic condition(s);respiratory conditions(s) ? ? Goals Addressed   ? ?  ?  ?  ?  ? This Visit's Progress  ?  Track and Manage My Blood Pressure-Hypertension   On track  ?  Timeframe:  Long-Range Goal ?Priority:  High ?Start Date:   08/29/20                           ?Expected End Date: 03/01/21                     ? ?Follow Up Date 11/29/20  ?  ?- check blood pressure daily ?- choose a place to take my blood pressure (home, clinic or office, retail store) ?- write blood pr

## 2021-08-15 NOTE — Patient Instructions (Addendum)
Veronica Flores , ?Thank you for taking time to come for your Medicare Wellness Visit. I appreciate your ongoing commitment to your health goals. Please review the following plan we discussed and let me know if I can assist you in the future.  ? ?Screening recommendations/referrals: ?Colonoscopy: Done 12/12/2014 Repeat in 10 years ? ?Mammogram: Done 01/15/2021 Repeat annually ? ?Bone Density: Done 07/20/2019 Repeat every 2 years ? ?Recommended yearly ophthalmology/optometry visit for glaucoma screening and checkup ?Recommended yearly dental visit for hygiene and checkup ? ?Vaccinations: ?Influenza vaccine: Done 01/10/2021 Repeat annually ? ?Pneumococcal vaccine: Done 05/27/2013 and 01/14/2018 ?Tdap vaccine: Done 03/18/2011 Repeat in 10 years ? ?Shingles vaccine: Discussed.   ?Covid-19:Done 01/03/2020, 06/21/2019 and 05/28/2019 ? ?Advanced directives: Please bring a copy of your health care power of attorney and living will to the office to be added to your chart at your convenience. ? ? ?Conditions/risks identified: KEEP UP THE GOOD WORK!! ? ?Next appointment: Follow up in one year for your annual wellness visit 08/21/2022 @ 9AM. ? ? ?Preventive Care 36 Years and Older, Female ?Preventive care refers to lifestyle choices and visits with your health care provider that can promote health and wellness. ?What does preventive care include? ?A yearly physical exam. This is also called an annual well check. ?Dental exams once or twice a year. ?Routine eye exams. Ask your health care provider how often you should have your eyes checked. ?Personal lifestyle choices, including: ?Daily care of your teeth and gums. ?Regular physical activity. ?Eating a healthy diet. ?Avoiding tobacco and drug use. ?Limiting alcohol use. ?Practicing safe sex. ?Taking low-dose aspirin every day. ?Taking vitamin and mineral supplements as recommended by your health care provider. ?What happens during an annual well check? ?The services and screenings done by your  health care provider during your annual well check will depend on your age, overall health, lifestyle risk factors, and family history of disease. ?Counseling  ?Your health care provider may ask you questions about your: ?Alcohol use. ?Tobacco use. ?Drug use. ?Emotional well-being. ?Home and relationship well-being. ?Sexual activity. ?Eating habits. ?History of falls. ?Memory and ability to understand (cognition). ?Work and work Statistician. ?Reproductive health. ?Screening  ?You may have the following tests or measurements: ?Height, weight, and BMI. ?Blood pressure. ?Lipid and cholesterol levels. These may be checked every 5 years, or more frequently if you are over 7 years old. ?Skin check. ?Lung cancer screening. You may have this screening every year starting at age 42 if you have a 30-pack-year history of smoking and currently smoke or have quit within the past 15 years. ?Fecal occult blood test (FOBT) of the stool. You may have this test every year starting at age 54. ?Flexible sigmoidoscopy or colonoscopy. You may have a sigmoidoscopy every 5 years or a colonoscopy every 10 years starting at age 32. ?Hepatitis C blood test. ?Hepatitis B blood test. ?Sexually transmitted disease (STD) testing. ?Diabetes screening. This is done by checking your blood sugar (glucose) after you have not eaten for a while (fasting). You may have this done every 1-3 years. ?Bone density scan. This is done to screen for osteoporosis. You may have this done starting at age 45. ?Mammogram. This may be done every 1-2 years. Talk to your health care provider about how often you should have regular mammograms. ?Talk with your health care provider about your test results, treatment options, and if necessary, the need for more tests. ?Vaccines  ?Your health care provider may recommend certain vaccines, such as: ?Influenza vaccine. This  is recommended every year. ?Tetanus, diphtheria, and acellular pertussis (Tdap, Td) vaccine. You may  need a Td booster every 10 years. ?Zoster vaccine. You may need this after age 84. ?Pneumococcal 13-valent conjugate (PCV13) vaccine. One dose is recommended after age 40. ?Pneumococcal polysaccharide (PPSV23) vaccine. One dose is recommended after age 55. ?Talk to your health care provider about which screenings and vaccines you need and how often you need them. ?This information is not intended to replace advice given to you by your health care provider. Make sure you discuss any questions you have with your health care provider. ?Document Released: 05/04/2015 Document Revised: 12/26/2015 Document Reviewed: 02/06/2015 ?Elsevier Interactive Patient Education ? 2017 [place]. ? ?Fall Prevention in the Home ?Falls can cause injuries. They can happen to people of all ages. There are many things you can do to make your home safe and to help prevent falls. ?What can I do on the outside of my home? ?Regularly fix the edges of walkways and driveways and fix any cracks. ?Remove anything that might make you trip as you walk through a door, such as a raised step or threshold. ?Trim any bushes or trees on the path to your home. ?Use bright outdoor lighting. ?Clear any walking paths of anything that might make someone trip, such as rocks or tools. ?Regularly check to see if handrails are loose or broken. Make sure that both sides of any steps have handrails. ?Any raised decks and porches should have guardrails on the edges. ?Have any leaves, snow, or ice cleared regularly. ?Use sand or salt on walking paths during winter. ?Clean up any spills in your garage right away. This includes oil or grease spills. ?What can I do in the bathroom? ?Use night lights. ?Install grab bars by the toilet and in the tub and shower. Do not use towel bars as grab bars. ?Use non-skid mats or decals in the tub or shower. ?If you need to sit down in the shower, use a plastic, non-slip stool. ?Keep the floor dry. Clean up any water that spills on  the floor as soon as it happens. ?Remove soap buildup in the tub or shower regularly. ?Attach bath mats securely with double-sided non-slip rug tape. ?Do not have throw rugs and other things on the floor that can make you trip. ?What can I do in the bedroom? ?Use night lights. ?Make sure that you have a light by your bed that is easy to reach. ?Do not use any sheets or blankets that are too big for your bed. They should not hang down onto the floor. ?Have a firm chair that has side arms. You can use this for support while you get dressed. ?Do not have throw rugs and other things on the floor that can make you trip. ?What can I do in the kitchen? ?Clean up any spills right away. ?Avoid walking on wet floors. ?Keep items that you use a lot in easy-to-reach places. ?If you need to reach something above you, use a strong step stool that has a grab bar. ?Keep electrical cords out of the way. ?Do not use floor polish or wax that makes floors slippery. If you must use wax, use non-skid floor wax. ?Do not have throw rugs and other things on the floor that can make you trip. ?What can I do with my stairs? ?Do not leave any items on the stairs. ?Make sure that there are handrails on both sides of the stairs and use them. Fix handrails that  are broken or loose. Make sure that handrails are as long as the stairways. ?Check any carpeting to make sure that it is firmly attached to the stairs. Fix any carpet that is loose or worn. ?Avoid having throw rugs at the top or bottom of the stairs. If you do have throw rugs, attach them to the floor with carpet tape. ?Make sure that you have a light switch at the top of the stairs and the bottom of the stairs. If you do not have them, ask someone to add them for you. ?What else can I do to help prevent falls? ?Wear shoes that: ?Do not have high heels. ?Have rubber bottoms. ?Are comfortable and fit you well. ?Are closed at the toe. Do not wear sandals. ?If you use a stepladder: ?Make sure  that it is fully opened. Do not climb a closed stepladder. ?Make sure that both sides of the stepladder are locked into place. ?Ask someone to hold it for you, if possible. ?Clearly mark and make sure that

## 2021-08-20 DIAGNOSIS — M85852 Other specified disorders of bone density and structure, left thigh: Secondary | ICD-10-CM | POA: Diagnosis not present

## 2021-08-20 DIAGNOSIS — M85851 Other specified disorders of bone density and structure, right thigh: Secondary | ICD-10-CM | POA: Diagnosis not present

## 2021-09-19 ENCOUNTER — Telehealth: Payer: Medicare Other

## 2021-10-16 ENCOUNTER — Other Ambulatory Visit: Payer: Self-pay | Admitting: Family Medicine

## 2021-10-17 NOTE — Telephone Encounter (Signed)
Requested Prescriptions  Pending Prescriptions Disp Refills  . verapamil (CALAN-SR) 240 MG CR tablet [Pharmacy Med Name: VERAPAMIL ER '240MG'$  TABLETS] 90 tablet 1    Sig: TAKE 1 TABLET BY MOUTH DAILY     Cardiovascular: Calcium Channel Blockers 3 Passed - 10/16/2021  7:16 PM      Passed - ALT in normal range and within 360 days    ALT  Date Value Ref Range Status  08/05/2021 15 6 - 29 U/L Final         Passed - AST in normal range and within 360 days    AST  Date Value Ref Range Status  08/05/2021 21 10 - 35 U/L Final         Passed - Cr in normal range and within 360 days    Creat  Date Value Ref Range Status  08/05/2021 0.87 0.60 - 1.00 mg/dL Final         Passed - Last BP in normal range    BP Readings from Last 1 Encounters:  08/05/21 130/82         Passed - Last Heart Rate in normal range    Pulse Readings from Last 1 Encounters:  08/05/21 72         Passed - Valid encounter within last 6 months    Recent Outpatient Visits          2 months ago Pure hypercholesterolemia   [place] [facility] Schaumann, Cammie Mcgee, MD   4 months ago [place] Eulogio Bear, NP   8 months ago Primary hypertension   [place] Dennard Schaumann, Cammie Mcgee, MD   1 year ago Encounter for annual wellness exam in Medicare patient   [place] Eulogio Bear, NP   1 year ago Primary hypertension   [place], Cammie Mcgee, MD      Future Appointments            In 3 months Pickard, Cammie Mcgee, MD [place]

## 2021-12-12 ENCOUNTER — Other Ambulatory Visit: Payer: Self-pay | Admitting: Family Medicine

## 2021-12-12 DIAGNOSIS — E7800 Pure hypercholesterolemia, unspecified: Secondary | ICD-10-CM

## 2021-12-12 NOTE — Telephone Encounter (Signed)
Requested Prescriptions  Pending Prescriptions Disp Refills  . rosuvastatin (CRESTOR) 20 MG tablet [Pharmacy Med Name: ROSUVASTATIN '20MG'$  TABLETS] 90 tablet 1    Sig: TAKE 1 TABLET BY MOUTH DAILY     Cardiovascular:  Antilipid - Statins 2 Failed - 12/12/2021  2:43 PM      Failed - Lipid Panel in normal range within the last 12 months    Cholesterol  Date Value Ref Range Status  08/05/2021 102 <200 mg/dL Final   LDL Cholesterol (Calc)  Date Value Ref Range Status  08/05/2021 43 mg/dL (calc) Final    Comment:    Reference range: <100 . Desirable range <100 mg/dL for primary prevention;   <70 mg/dL for patients with CHD or diabetic patients  with > or = 2 CHD risk factors. Marland Kitchen LDL-C is now calculated using the Martin-Hopkins  calculation, which is a validated novel method providing  better accuracy than the Friedewald equation in the  estimation of LDL-C.  Cresenciano Genre et al. Annamaria Helling. 3086;578(46): 2061-2068  (http://education.[facility].com/faq/FAQ164)    HDL  Date Value Ref Range Status  08/05/2021 40 (L) > OR = 50 mg/dL Final   Triglycerides  Date Value Ref Range Status  08/05/2021 107 <150 mg/dL Final         Passed - Cr in normal range and within 360 days    Creat  Date Value Ref Range Status  08/05/2021 0.87 0.60 - 1.00 mg/dL Final         Passed - Patient is not pregnant      Passed - Valid encounter within last 12 months    Recent Outpatient Visits          4 months ago Pure hypercholesterolemia   [place] Pickard, Cammie Mcgee, MD   6 months ago [place] Eulogio Bear, NP   10 months ago Primary hypertension   [place] Dennard Schaumann, Cammie Mcgee, MD   1 year ago Encounter for annual wellness exam in Medicare patient   [place] Eulogio Bear, NP   1 year ago Primary hypertension   [place] Pickard, Cammie Mcgee, MD      Future Appointments             In 2 months Pickard, Cammie Mcgee, MD [place]

## 2021-12-24 DIAGNOSIS — H04563 Stenosis of bilateral lacrimal punctum: Secondary | ICD-10-CM | POA: Diagnosis not present

## 2021-12-24 DIAGNOSIS — H524 Presbyopia: Secondary | ICD-10-CM | POA: Diagnosis not present

## 2021-12-24 DIAGNOSIS — H2513 Age-related nuclear cataract, bilateral: Secondary | ICD-10-CM | POA: Diagnosis not present

## 2022-01-07 DIAGNOSIS — K754 Autoimmune hepatitis: Secondary | ICD-10-CM | POA: Diagnosis not present

## 2022-01-13 DIAGNOSIS — K754 Autoimmune hepatitis: Secondary | ICD-10-CM | POA: Diagnosis not present

## 2022-01-13 DIAGNOSIS — N2889 Other specified disorders of kidney and ureter: Secondary | ICD-10-CM | POA: Diagnosis not present

## 2022-01-17 ENCOUNTER — Ambulatory Visit (INDEPENDENT_AMBULATORY_CARE_PROVIDER_SITE_OTHER): Payer: Medicare Other

## 2022-01-17 DIAGNOSIS — E7800 Pure hypercholesterolemia, unspecified: Secondary | ICD-10-CM

## 2022-01-17 DIAGNOSIS — I10 Essential (primary) hypertension: Secondary | ICD-10-CM

## 2022-01-17 DIAGNOSIS — Z23 Encounter for immunization: Secondary | ICD-10-CM

## 2022-01-20 DIAGNOSIS — E7800 Pure hypercholesterolemia, unspecified: Secondary | ICD-10-CM

## 2022-01-20 DIAGNOSIS — I10 Essential (primary) hypertension: Secondary | ICD-10-CM | POA: Diagnosis not present

## 2022-01-20 DIAGNOSIS — Z23 Encounter for immunization: Secondary | ICD-10-CM

## 2022-01-21 DIAGNOSIS — Z1231 Encounter for screening mammogram for malignant neoplasm of breast: Secondary | ICD-10-CM | POA: Diagnosis not present

## 2022-01-21 LAB — HM MAMMOGRAPHY

## 2022-01-30 ENCOUNTER — Other Ambulatory Visit: Payer: Self-pay

## 2022-01-30 ENCOUNTER — Telehealth: Payer: Self-pay

## 2022-01-30 DIAGNOSIS — I421 Obstructive hypertrophic cardiomyopathy: Secondary | ICD-10-CM

## 2022-01-30 DIAGNOSIS — I10 Essential (primary) hypertension: Secondary | ICD-10-CM

## 2022-01-30 MED ORDER — VALSARTAN 80 MG PO TABS: 80.0000 mg | ORAL_TABLET | Freq: Every day | ORAL | 3 refills | Status: DC

## 2022-01-30 NOTE — Telephone Encounter (Signed)
Pt called in to request a refill of  valsartan (DIOVAN) 80 MG tablet [[id number]]    Order Details Dose: 80 mg Route: Oral Frequency: Daily  Dispense Quantity: 90 tablet Refills: 3      LOV: 09/19/21 UPCOMING APPT: 02/04/22 PHARMACY: Tedd Sias ([facility]) [place], [place]

## 2022-02-04 ENCOUNTER — Ambulatory Visit: Payer: Medicare Other | Admitting: Family Medicine

## 2022-02-10 ENCOUNTER — Ambulatory Visit (INDEPENDENT_AMBULATORY_CARE_PROVIDER_SITE_OTHER): Payer: Medicare Other | Admitting: Family Medicine

## 2022-02-10 VITALS — BP 130/70 | HR 67 | Ht 67.0 in | Wt 183.0 lb

## 2022-02-10 DIAGNOSIS — E7800 Pure hypercholesterolemia, unspecified: Secondary | ICD-10-CM

## 2022-02-10 DIAGNOSIS — K754 Autoimmune hepatitis: Secondary | ICD-10-CM

## 2022-02-10 DIAGNOSIS — M8580 Other specified disorders of bone density and structure, unspecified site: Secondary | ICD-10-CM | POA: Diagnosis not present

## 2022-02-10 DIAGNOSIS — I421 Obstructive hypertrophic cardiomyopathy: Secondary | ICD-10-CM

## 2022-02-10 NOTE — Progress Notes (Signed)
Subjective:    Patient ID: Veronica Flores, female    DOB: Sep 02, 1945, 76 y.o.   MRN: [id number]   Patient is a very pleasant 76 year old Caucasian female who presents today for a checkup.  DEXA performed in May with T score /1.8 in left femur as lowest value.  Patient continues to struggle with the stress of caring for her husband with Parkinson's disease.  However she denies any trouble with insomnia or depression.  Her blood pressure today is excellent at 130/70.  She denies any chest pain or shortness of breath.  She had a mammogram recently however we do not have the results I will call get the information for that.  Her bone density is up-to-date as stated above as is her colonoscopy.  She is due for fasting lab work.  She has had a flu shot and a COVID booster.  Past Medical History:  Diagnosis Date   Allergy    Asthma    Asthma    Phreesia 02/27/2020   Autoimmune hepatitis ([place])    Fatigue    Hypertension    Hypertrophic cardiomyopathy ([place])    Meningioma (HCC)    s/p craniotomy and removal from right medial sphenoid wing extending into cavernous sinus   Migraine, ophthalmoplegic    Osteopenia    Past Surgical History:  Procedure Laterality Date   ANKLE SURGERY     BRAIN SURGERY     CHOLECYSTECTOMY     FRACTURE SURGERY N/A    Phreesia 02/27/2020   IR RADIOLOGIST EVAL & MGMT  08/26/2016   IR VERTEBROPLASTY LUMBAR BX INC UNI/BIL INC/INJECT/IMAGING  07/30/2016   WRIST SURGERY     Current Outpatient Medications on File Prior to Visit  Medication Sig Dispense Refill   albuterol (VENTOLIN HFA) 108 (90 Base) MCG/ACT inhaler Inhale 2 puffs into the lungs every 6 (six) hours as needed for wheezing or shortness of breath. 8 g 0   aspirin EC 81 MG tablet Take 81 mg by mouth daily. Swallow whole.     azaTHIOprine (IMURAN) 50 MG tablet Take 100 mg by mouth daily. 2 pills once a day     Calcium Carbonate-Vitamin D 600-400 MG-UNIT chew tablet Chew 1 tablet by mouth 2 (two) times daily.      cetirizine (ZYRTEC) 5 MG tablet Take 1 tablet (5 mg total) by mouth 2 (two) times daily. 20 tablet 0   docusate sodium (COLACE) 100 MG capsule Take 100 mg by mouth daily.     famotidine (PEPCID) 40 MG tablet Take 1 tablet (40 mg total) by mouth 2 (two) times daily. 20 tablet 0   fluticasone-salmeterol (ADVAIR) 100-50 MCG/ACT AEPB Inhale 1 puff into the lungs 2 (two) times daily. 180 each 3   ipratropium (ATROVENT) 0.06 % nasal spray USE ONE SPRAY NASALLY THREE TIMES DAILY 45 mL 3   levocetirizine (XYZAL) 5 MG tablet Take 1 tablet (5 mg total) by mouth every evening. 90 tablet 3   meloxicam (MOBIC) 15 MG tablet Take 1 tablet (15 mg total) by mouth daily. 90 tablet 1   montelukast (SINGULAIR) 10 MG tablet TAKE 1 TABLET BY MOUTH AT BEDTIME 90 tablet 3   PREVIDENT 1.1 % GEL dental gel Place onto teeth daily.     rosuvastatin (CRESTOR) 20 MG tablet TAKE 1 TABLET BY MOUTH DAILY 90 tablet 1   valsartan (DIOVAN) 80 MG tablet Take 1 tablet (80 mg total) by mouth daily. 90 tablet 3   verapamil (CALAN-SR) 240 MG CR tablet  TAKE 1 TABLET BY MOUTH DAILY 90 tablet 1   No current facility-administered medications on file prior to visit.   Allergies  Allergen Reactions   Penicillins Anaphylaxis   Allegra [Fexofenadine Hydrochloride]     palpitations   Desloratadine     Palpitations    Cephalexin Hives and Rash   Molnupiravir Rash   Social History   Socioeconomic History   Marital status: Married    Spouse name: Not on file   Number of children: Not on file   Years of education: Not on file   Highest education level: Not on file  Occupational History   Not on file  Tobacco Use   Smoking status: Never   Smokeless tobacco: Never  Substance and Sexual Activity   Alcohol use: No   Drug use: No   Sexual activity: Not on file  Other Topics Concern   Not on file  Social History Narrative   Not on file   Social Determinants of Health   Financial Resource Strain: Low Risk  (08/15/2021)    Overall Financial Resource Strain (CARDIA)    Difficulty of Paying Living Expenses: Not very hard  Food Insecurity: No Food Insecurity (08/15/2021)   Hunger Vital Sign    Worried About Running Out of Food in the Last Year: Never true    Ran Out of Food in the Last Year: Never true  Transportation Needs: No Transportation Needs (08/15/2021)   PRAPARE - Hydrologist (Medical): No    Lack of Transportation (Non-Medical): No  Physical Activity: Sufficiently Active (08/15/2021)   Exercise Vital Sign    Days of Exercise per Week: 5 days    Minutes of Exercise per Session: 30 min  Stress: Stress Concern Present (08/15/2021)   [place]    Feeling of Stress : Rather much  Social Connections: Socially Integrated (08/15/2021)   Social Connection and Isolation Panel [NHANES]    Frequency of Communication with Friends and Family: More than three times a week    Frequency of Social Gatherings with Friends and Family: More than three times a week    Attends Religious Services: More than 4 times per year    Active Member of [employer] or Organizations: Yes    Attends Archivist Meetings: More than 4 times per year    Marital Status: Married  Human resources officer Violence: Not At Risk (08/15/2021)   Humiliation, Afraid, Rape, and Kick questionnaire    Fear of Current or Ex-Partner: No    Emotionally Abused: No    Physically Abused: No    Sexually Abused: No     Review of Systems  All other systems reviewed and are negative.      Objective:   Physical Exam Vitals reviewed.  Constitutional:      General: She is not in acute distress.    Appearance: She is well-developed. She is not diaphoretic.  HENT:     Head: Normocephalic and atraumatic.     Nose: Nose normal.     Mouth/Throat:     Pharynx: No oropharyngeal exudate.  Eyes:     Conjunctiva/sclera: Conjunctivae normal.  Cardiovascular:      Rate and Rhythm: Normal rate and regular rhythm.     Heart sounds: Normal heart sounds. No murmur heard. Pulmonary:     Effort: Pulmonary effort is normal. No respiratory distress.     Breath sounds: Normal breath sounds. No wheezing or rales.  Abdominal:     General: Bowel sounds are normal. There is no distension.     Palpations: Abdomen is soft.     Tenderness: There is no abdominal tenderness. There is no guarding or rebound.  Musculoskeletal:     Cervical back: Neck supple.  Lymphadenopathy:     Cervical: No cervical adenopathy.  Neurological:     Mental Status: She is alert and oriented to person, place, and time.     Cranial Nerves: No cranial nerve deficit.     Motor: No abnormal muscle tone.     Coordination: Coordination normal.     Deep Tendon Reflexes: Reflexes are normal and symmetric.  Psychiatric:        Behavior: Behavior normal.        Thought Content: Thought content normal.        Judgment: Judgment normal.           Assessment & Plan:  Pure hypercholesterolemia - Plan: CBC with Differential/Platelet, COMPLETE METABOLIC PANEL WITH GFR, Lipid panel  Autoimmune hepatitis ([place]) - Plan: CBC with Differential/Platelet, COMPLETE METABOLIC PANEL WITH GFR, Lipid panel  HOCM (hypertrophic obstructive cardiomyopathy) (HCC)  Osteopenia, unspecified location - Plan: CBC with Differential/Platelet, COMPLETE METABOLIC PANEL WITH GFR, Lipid panel Patient's blood pressure is outstanding.  Check CBC CMP and lipid panel.  Patient's bone density is up-to-date and she is taking calcium and vitamin D.  She denies any falls or depression or memory loss.  Monitor liver function test.  Overall the patient is doing well from a medical standpoint other than dealing with the stress of her current social situation

## 2022-02-11 LAB — CBC WITH DIFFERENTIAL/PLATELET
Absolute Monocytes: 322 cells/uL (ref 200–950)
Basophils Absolute: 10 cells/uL (ref 0–200)
Basophils Relative: 0.2 %
Eosinophils Absolute: 0 cells/uL — ABNORMAL LOW (ref 15–500)
Eosinophils Relative: 0 %
HCT: 43.5 % (ref 35.0–45.0)
Hemoglobin: 14.4 g/dL (ref 11.7–15.5)
Lymphs Abs: 1290 cells/uL (ref 850–3900)
MCH: 29.4 pg (ref 27.0–33.0)
MCHC: 33.1 g/dL (ref 32.0–36.0)
MCV: 88.8 fL (ref 80.0–100.0)
MPV: 9.4 fL (ref 7.5–12.5)
Monocytes Relative: 6.2 %
Neutro Abs: 3578 cells/uL (ref 1500–7800)
Neutrophils Relative %: 68.8 %
Platelets: 276 10*3/uL (ref 140–400)
RBC: 4.9 10*6/uL (ref 3.80–5.10)
RDW: 13.5 % (ref 11.0–15.0)
Total Lymphocyte: 24.8 %
WBC: 5.2 10*3/uL (ref 3.8–10.8)

## 2022-02-11 LAB — LIPID PANEL
Cholesterol: 92 mg/dL (ref <200)
HDL: 37 mg/dL — ABNORMAL LOW (ref >=50)
LDL Cholesterol (Calc): 37 mg/dL (calc)
Non-HDL Cholesterol (Calc): 55 mg/dL (calc) (ref <130)
Total CHOL/HDL Ratio: 2.5 (calc) (ref <5.0)
Triglycerides: 97 mg/dL (ref <150)

## 2022-02-11 LAB — COMPLETE METABOLIC PANEL WITH GFR
AG Ratio: 1.9 (calc) (ref 1.0–2.5)
ALT: 14 U/L (ref 6–29)
AST: 22 U/L (ref 10–35)
Albumin: 4.5 g/dL (ref 3.6–5.1)
Alkaline phosphatase (APISO): 51 U/L (ref 37–153)
BUN: 20 mg/dL (ref 7–25)
CO2: 23 mmol/L (ref 20–32)
Calcium: 9.5 mg/dL (ref 8.6–10.4)
Chloride: 108 mmol/L (ref 98–110)
Creat: 0.83 mg/dL (ref 0.60–1.00)
Globulin: 2.4 g/dL (calc) (ref 1.9–3.7)
Glucose, Bld: 104 mg/dL — ABNORMAL HIGH (ref 65–99)
Potassium: 4.1 mmol/L (ref 3.5–5.3)
Sodium: 139 mmol/L (ref 135–146)
Total Bilirubin: 0.6 mg/dL (ref 0.2–1.2)
Total Protein: 6.9 g/dL (ref 6.1–8.1)
eGFR: 73 mL/min/{1.73_m2} (ref >=60)

## 2022-02-12 DIAGNOSIS — Z23 Encounter for immunization: Secondary | ICD-10-CM | POA: Diagnosis not present

## 2022-02-14 ENCOUNTER — Encounter: Payer: Self-pay | Admitting: Family Medicine

## 2022-02-27 ENCOUNTER — Other Ambulatory Visit: Payer: Self-pay | Admitting: Family Medicine

## 2022-02-27 NOTE — Telephone Encounter (Signed)
Requested Prescriptions  Pending Prescriptions Disp Refills   ADVAIR DISKUS 100-50 MCG/ACT AEPB [Pharmacy Med Name: ADVAIR DISKUS 100/50MCG (GREEN)60S] 180 each 3    Sig: USE ONE INHALATION BY MOUTH INTO THE LUNGS 2 TIMES DAILY     Pulmonology:  Combination Products Passed - 02/27/2022  8:28 AM      Passed - Valid encounter within last 12 months    Recent Outpatient Visits           6 months ago Pure hypercholesterolemia   [place] Pickard, Cammie Mcgee, MD   9 months ago [place] Eulogio Bear, NP   1 year ago Primary hypertension   [place] Dennard Schaumann, Cammie Mcgee, MD   1 year ago Encounter for annual wellness exam in Medicare patient   [place] Eulogio Bear, NP   1 year ago Primary hypertension   [place] Pickard, Cammie Mcgee, MD       Future Appointments             In 5 months Pickard, Cammie Mcgee, MD [place]

## 2022-04-16 ENCOUNTER — Other Ambulatory Visit: Payer: Self-pay | Admitting: Family Medicine

## 2022-04-16 DIAGNOSIS — K754 Autoimmune hepatitis: Secondary | ICD-10-CM | POA: Diagnosis not present

## 2022-04-16 DIAGNOSIS — K7401 Hepatic fibrosis, early fibrosis: Secondary | ICD-10-CM | POA: Diagnosis not present

## 2022-04-16 NOTE — Telephone Encounter (Signed)
Requested Prescriptions  Pending Prescriptions Disp Refills   verapamil (CALAN-SR) 240 MG CR tablet [Pharmacy Med Name: VERAPAMIL ER '240MG'$  TABLETS] 90 tablet 1    Sig: TAKE 1 TABLET BY MOUTH DAILY     Cardiovascular: Calcium Channel Blockers 3 Failed - 04/16/2022 10:55 AM      Failed - Valid encounter within last 6 months    Recent Outpatient Visits           8 months ago Pure hypercholesterolemia   [place] Pickard, Cammie Mcgee, MD   10 months ago [place] Eulogio Bear, NP   1 year ago Primary hypertension   [place] Susy Frizzle, MD   1 year ago Encounter for annual wellness exam in Medicare patient   [place] Eulogio Bear, NP   1 year ago Primary hypertension   [place] Pickard, Cammie Mcgee, MD       Future Appointments             In 3 months Pickard, Cammie Mcgee, MD [place]            Passed - ALT in normal range and within 360 days    ALT  Date Value Ref Range Status  02/10/2022 14 6 - 29 U/L Final         Passed - AST in normal range and within 360 days    AST  Date Value Ref Range Status  02/10/2022 22 10 - 35 U/L Final         Passed - Cr in normal range and within 360 days    Creat  Date Value Ref Range Status  02/10/2022 0.83 0.60 - 1.00 mg/dL Final         Passed - Last BP in normal range    BP Readings from Last 1 Encounters:  02/10/22 130/70         Passed - Last Heart Rate in normal range    Pulse Readings from Last 1 Encounters:  02/10/22 67

## 2022-05-08 ENCOUNTER — Encounter: Payer: Self-pay | Admitting: Family Medicine

## 2022-06-30 ENCOUNTER — Other Ambulatory Visit: Payer: Self-pay | Admitting: Family Medicine

## 2022-06-30 DIAGNOSIS — E7800 Pure hypercholesterolemia, unspecified: Secondary | ICD-10-CM

## 2022-07-07 DIAGNOSIS — N281 Cyst of kidney, acquired: Secondary | ICD-10-CM | POA: Diagnosis not present

## 2022-07-07 DIAGNOSIS — K754 Autoimmune hepatitis: Secondary | ICD-10-CM | POA: Diagnosis not present

## 2022-08-11 ENCOUNTER — Other Ambulatory Visit: Payer: Self-pay | Admitting: Family Medicine

## 2022-08-12 ENCOUNTER — Encounter: Payer: Self-pay | Admitting: Family Medicine

## 2022-08-12 ENCOUNTER — Ambulatory Visit (INDEPENDENT_AMBULATORY_CARE_PROVIDER_SITE_OTHER): Payer: Medicare Other | Admitting: Family Medicine

## 2022-08-12 VITALS — BP 128/72 | HR 71 | Temp 98.5°F | Ht 67.0 in | Wt 181.6 lb

## 2022-08-12 DIAGNOSIS — I421 Obstructive hypertrophic cardiomyopathy: Secondary | ICD-10-CM | POA: Diagnosis not present

## 2022-08-12 DIAGNOSIS — E7800 Pure hypercholesterolemia, unspecified: Secondary | ICD-10-CM | POA: Diagnosis not present

## 2022-08-12 DIAGNOSIS — K754 Autoimmune hepatitis: Secondary | ICD-10-CM | POA: Diagnosis not present

## 2022-08-12 DIAGNOSIS — E785 Hyperlipidemia, unspecified: Secondary | ICD-10-CM | POA: Insufficient documentation

## 2022-08-12 LAB — CBC WITH DIFFERENTIAL/PLATELET
Absolute Monocytes: 360 cells/uL (ref 200–950)
HCT: 44.4 % (ref 35.0–45.0)
Hemoglobin: 14.6 g/dL (ref 11.7–15.5)
Lymphs Abs: 1301 cells/uL (ref 850–3900)
MCH: 28.6 pg (ref 27.0–33.0)
Neutrophils Relative %: 64.8 %
Platelets: 272 10*3/uL (ref 140–400)

## 2022-08-12 NOTE — Progress Notes (Signed)
Subjective:    Patient ID: Veronica Flores, female    DOB: [birth year], 77 y.o.   MRN: [id number]  Patient is a very pleasant 77 year old Caucasian female here today for a checkup.  Her blood pressure today is excellent at 128/72.  Her mammogram is up-to-date in October.  Her bone density test is up-to-date.  Her last colonoscopy was in 2018.  She believes that this was due again in 5 years.  She is asking [place] to check with her gastroenterologist.  Otherwise she is due for fasting lab work.  She does report significant fatigue.  She is having to care for her husband who has Parkinson's disease.  She is requesting a referral for home health nursing for him.  Outside of the fatigue however she is doing well  Past Medical History:  Diagnosis Date   Allergy    Asthma    Asthma    Phreesia 02/27/2020   Autoimmune hepatitis    Fatigue    Hypertension    Hypertrophic cardiomyopathy    Meningioma    s/p craniotomy and removal from right medial sphenoid wing extending into cavernous sinus   Migraine, ophthalmoplegic    Osteopenia    Past Surgical History:  Procedure Laterality Date   ANKLE SURGERY     BRAIN SURGERY     CHOLECYSTECTOMY     FRACTURE SURGERY N/A    Phreesia 02/27/2020   IR RADIOLOGIST EVAL & MGMT  08/26/2016   IR VERTEBROPLASTY LUMBAR BX INC UNI/BIL INC/INJECT/IMAGING  07/30/2016   WRIST SURGERY     Current Outpatient Medications on File Prior to Visit  Medication Sig Dispense Refill   albuterol (VENTOLIN HFA) 108 (90 Base) MCG/ACT inhaler Inhale 2 puffs into the lungs every 6 (six) hours as needed for wheezing or shortness of breath. 8 g 0   aspirin EC 81 MG tablet Take 81 mg by mouth daily. Swallow whole.     azaTHIOprine (IMURAN) 50 MG tablet Take 100 mg by mouth daily. 2 pills once a day     Calcium Carbonate-Vitamin D 600-400 MG-UNIT chew tablet Chew 1 tablet by mouth 2 (two) times daily.     cetirizine (ZYRTEC) 5 MG tablet Take 1 tablet (5 mg total) by mouth 2 (two) times  daily. 20 tablet 0   ipratropium (ATROVENT) 0.06 % nasal spray USE ONE SPRAY NASALLY THREE TIMES DAILY 45 mL 3   levocetirizine (XYZAL) 5 MG tablet Take 1 tablet (5 mg total) by mouth every evening. 90 tablet 3   meloxicam (MOBIC) 15 MG tablet Take 1 tablet (15 mg total) by mouth daily. 90 tablet 1   montelukast (SINGULAIR) 10 MG tablet TAKE 1 TABLET BY MOUTH AT BEDTIME 90 tablet 3   PREVIDENT 1.1 % GEL dental gel Place onto teeth daily.     rosuvastatin (CRESTOR) 20 MG tablet TAKE 1 TABLET BY MOUTH DAILY 90 tablet 1   valsartan (DIOVAN) 80 MG tablet Take 1 tablet (80 mg total) by mouth daily. 90 tablet 3   verapamil (CALAN-SR) 240 MG CR tablet TAKE 1 TABLET BY MOUTH DAILY 90 tablet 1   ADVAIR DISKUS 100-50 MCG/ACT AEPB USE ONE INHALATION BY MOUTH INTO THE LUNGS 2 TIMES DAILY (Patient not taking: Reported on 08/12/2022) 180 each 3   No current facility-administered medications on file prior to visit.   Allergies  Allergen Reactions   Penicillins Anaphylaxis   Allegra [Fexofenadine Hydrochloride]     palpitations   Desloratadine     Palpitations  Cephalexin Hives and Rash   Molnupiravir Rash   Social History   Socioeconomic History   Marital status: Married    Spouse name: Not on file   Number of children: Not on file   Years of education: Not on file   Highest education level: 12th grade  Occupational History   Not on file  Tobacco Use   Smoking status: Never   Smokeless tobacco: Never  Substance and Sexual Activity   Alcohol use: No   Drug use: No   Sexual activity: Not on file  Other Topics Concern   Not on file  Social History Narrative   Not on file   Social Determinants of Health   Financial Resource Strain: Low Risk  (08/11/2022)   Overall Financial Resource Strain (CARDIA)    Difficulty of Paying Living Expenses: Not very hard  Food Insecurity: No Food Insecurity (08/11/2022)   Hunger Vital Sign    Worried About Running Out of Food in the Last Year: Never  true    Ran Out of Food in the Last Year: Never true  Transportation Needs: No Transportation Needs (08/11/2022)   PRAPARE - Administrator, Civil Service (Medical): No    Lack of Transportation (Non-Medical): No  Physical Activity: Unknown (08/11/2022)   Exercise Vital Sign    Days of Exercise per Week: Patient declined    Minutes of Exercise per Session: 30 min  Stress: Stress Concern Present (08/11/2022)   [employer] of Occupational Health - Occupational Stress Questionnaire    Feeling of Stress : To some extent  Social Connections: Socially Integrated (08/11/2022)   Social Connection and Isolation Panel [NHANES]    Frequency of Communication with Friends and Family: More than three times a week    Frequency of Social Gatherings with Friends and Family: Once a week    Attends Religious Services: More than 4 times per year    Active Member of [employer] or Organizations: Yes    Attends Banker Meetings: More than 4 times per year    Marital Status: Married  Catering manager Violence: Not At Risk (08/15/2021)   Humiliation, Afraid, Rape, and Kick questionnaire    Fear of Current or Ex-Partner: No    Emotionally Abused: No    Physically Abused: No    Sexually Abused: No     Review of Systems  All other systems reviewed and are negative.      Objective:   Physical Exam Vitals reviewed.  Constitutional:      General: She is not in acute distress.    Appearance: She is well-developed. She is not diaphoretic.  HENT:     Head: Normocephalic and atraumatic.     Nose: Nose normal.     Mouth/Throat:     Pharynx: No oropharyngeal exudate.  Eyes:     Conjunctiva/sclera: Conjunctivae normal.  Cardiovascular:     Rate and Rhythm: Normal rate and regular rhythm.     Heart sounds: Normal heart sounds. No murmur heard. Pulmonary:     Effort: Pulmonary effort is normal. No respiratory distress.     Breath sounds: Normal breath sounds. No wheezing or rales.   Abdominal:     General: Bowel sounds are normal. There is no distension.     Palpations: Abdomen is soft.     Tenderness: There is no abdominal tenderness. There is no guarding or rebound.  Musculoskeletal:     Cervical back: Neck supple.  Lymphadenopathy:  Cervical: No cervical adenopathy.  Neurological:     Mental Status: She is alert and oriented to person, place, and time.     Cranial Nerves: No cranial nerve deficit.     Motor: No abnormal muscle tone.     Coordination: Coordination normal.     Deep Tendon Reflexes: Reflexes are normal and symmetric.  Psychiatric:        Behavior: Behavior normal.        Thought Content: Thought content normal.        Judgment: Judgment normal.           Assessment & Plan:  Pure hypercholesterolemia - Plan: CBC with Differential/Platelet, Lipid panel, COMPLETE METABOLIC PANEL WITH GFR  Autoimmune hepatitis  HOCM (hypertrophic obstructive cardiomyopathy) I am very happy with her blood pressure.  I will check a CBC a CMP and lipid panel.  She had a FibroScan performed in December that showed no evidence of cirrhosis or mammogram is up-to-date.  We will check on when her next colonoscopy is due this may be due now.  Otherwise preventative is up-to-date.

## 2022-08-13 LAB — COMPLETE METABOLIC PANEL WITH GFR
AG Ratio: 1.8 (calc) (ref 1.0–2.5)
ALT: 10 U/L (ref 6–29)
AST: 19 U/L (ref 10–35)
Albumin: 4.4 g/dL (ref 3.6–5.1)
Alkaline phosphatase (APISO): 46 U/L (ref 37–153)
BUN: 14 mg/dL (ref 7–25)
CO2: 19 mmol/L — ABNORMAL LOW (ref 20–32)
Calcium: 9.8 mg/dL (ref 8.6–10.4)
Chloride: 109 mmol/L (ref 98–110)
Creat: 0.89 mg/dL (ref 0.60–1.00)
Globulin: 2.4 g/dL (calc) (ref 1.9–3.7)
Glucose, Bld: 102 mg/dL — ABNORMAL HIGH (ref 65–99)
Potassium: 4.2 mmol/L (ref 3.5–5.3)
Sodium: 140 mmol/L (ref 135–146)
Total Bilirubin: 0.6 mg/dL (ref 0.2–1.2)
Total Protein: 6.8 g/dL (ref 6.1–8.1)
eGFR: 67 mL/min/{1.73_m2} (ref >=60)

## 2022-08-13 LAB — CBC WITH DIFFERENTIAL/PLATELET
Basophils Absolute: 19 cells/uL (ref 0–200)
Basophils Relative: 0.4 %
Eosinophils Absolute: 10 cells/uL — ABNORMAL LOW (ref 15–500)
Eosinophils Relative: 0.2 %
MCHC: 32.9 g/dL (ref 32.0–36.0)
MCV: 86.9 fL (ref 80.0–100.0)
MPV: 10 fL (ref 7.5–12.5)
Monocytes Relative: 7.5 %
Neutro Abs: 3110 cells/uL (ref 1500–7800)
RBC: 5.11 10*6/uL — ABNORMAL HIGH (ref 3.80–5.10)
RDW: 13.5 % (ref 11.0–15.0)
Total Lymphocyte: 27.1 %
WBC: 4.8 10*3/uL (ref 3.8–10.8)

## 2022-08-13 LAB — LIPID PANEL
Cholesterol: 92 mg/dL (ref <200)
HDL: 34 mg/dL — ABNORMAL LOW (ref >=50)
LDL Cholesterol (Calc): 37 mg/dL (calc)
Non-HDL Cholesterol (Calc): 58 mg/dL (calc) (ref <130)
Total CHOL/HDL Ratio: 2.7 (calc) (ref <5.0)
Triglycerides: 124 mg/dL (ref <150)

## 2022-08-14 ENCOUNTER — Other Ambulatory Visit: Payer: Self-pay | Admitting: Family Medicine

## 2022-08-20 NOTE — Patient Instructions (Signed)
Veronica Flores , Thank you for taking time to come for your Medicare Wellness Visit. I appreciate your ongoing commitment to your health goals. Please review the following plan we discussed and let me know if I can assist you in the future.   These are the goals we discussed:  Goals      Remain active and independent     Manage caregiver role with less stress         This is a list of the screening recommended for you and due dates:  Health Maintenance  Topic Date Due   COVID-19 Vaccine (4 - 2023-24 season) 08/28/2022*   Hepatitis C Screening: USPSTF Recommendation to screen - Ages 18-79 yo.  08/12/2023*   Flu Shot  11/20/2022   Medicare Annual Wellness Visit  08/21/2023   Pneumonia Vaccine  Completed   DEXA scan (bone density measurement)  Completed   HPV Vaccine  Aged Out   DTaP/Tdap/Td vaccine  Discontinued   Colon Cancer Screening  Discontinued   Zoster (Shingles) Vaccine  Discontinued  *Topic was postponed. The date shown is not the original due date.    Advanced directives: Please bring a copy of your health care power of attorney and living will to the office to be added to your chart at your convenience.   Conditions/risks identified: Aim for 30 minutes of exercise or brisk walking, 6-8 glasses of water, and 5 servings of fruits and vegetables each day.  Next appointment: Follow up in one year for your annual wellness visit    Preventive Care 65 Years and Older, Female Preventive care refers to lifestyle choices and visits with your health care provider that can promote health and wellness. What does preventive care include? A yearly physical exam. This is also called an annual well check. Dental exams once or twice a year. Routine eye exams. Ask your health care provider how often you should have your eyes checked. Personal lifestyle choices, including: Daily care of your teeth and gums. Regular physical activity. Eating a healthy diet. Avoiding tobacco and drug  use. Limiting alcohol use. Practicing safe sex. Taking low-dose aspirin every day. Taking vitamin and mineral supplements as recommended by your health care provider. What happens during an annual well check? The services and screenings done by your health care provider during your annual well check will depend on your age, overall health, lifestyle risk factors, and family history of disease. Counseling  Your health care provider may ask you questions about your: Alcohol use. Tobacco use. Drug use. Emotional well-being. Home and relationship well-being. Sexual activity. Eating habits. History of falls. Memory and ability to understand (cognition). Work and work Astronomer. Reproductive health. Screening  You may have the following tests or measurements: Height, weight, and BMI. Blood pressure. Lipid and cholesterol levels. These may be checked every 5 years, or more frequently if you are over 66 years old. Skin check. Lung cancer screening. You may have this screening every year starting at age 70 if you have a 30-pack-year history of smoking and currently smoke or have quit within the past 15 years. Fecal occult blood test (FOBT) of the stool. You may have this test every year starting at age 42. Flexible sigmoidoscopy or colonoscopy. You may have a sigmoidoscopy every 5 years or a colonoscopy every 10 years starting at age 46. Hepatitis C blood test. Hepatitis B blood test. Sexually transmitted disease (STD) testing. Diabetes screening. This is done by checking your blood sugar (glucose) after you have not  eaten for a while (fasting). You may have this done every 1-3 years. Bone density scan. This is done to screen for osteoporosis. You may have this done starting at age 23. Mammogram. This may be done every 1-2 years. Talk to your health care provider about how often you should have regular mammograms. Talk with your health care provider about your test results, treatment  options, and if necessary, the need for more tests. Vaccines  Your health care provider may recommend certain vaccines, such as: Influenza vaccine. This is recommended every year. Tetanus, diphtheria, and acellular pertussis (Tdap, Td) vaccine. You may need a Td booster every 10 years. Zoster vaccine. You may need this after age 62. Pneumococcal 13-valent conjugate (PCV13) vaccine. One dose is recommended after age 85. Pneumococcal polysaccharide (PPSV23) vaccine. One dose is recommended after age 17. Talk to your health care provider about which screenings and vaccines you need and how often you need them. This information is not intended to replace advice given to you by your health care provider. Make sure you discuss any questions you have with your health care provider. Document Released: 05/04/2015 Document Revised: 12/26/2015 Document Reviewed: 02/06/2015 Elsevier Interactive Patient Education  2017 [employer] [employer] Prevention in the Home Falls can cause injuries. They can happen to people of all ages. There are many things you can do to make your home safe and to help prevent falls. What can I do on the outside of my home? Regularly fix the edges of walkways and driveways and fix any cracks. Remove anything that might make you trip as you walk through a door, such as a raised step or threshold. Trim any bushes or trees on the path to your home. Use bright outdoor lighting. Clear any walking paths of anything that might make someone trip, such as rocks or tools. Regularly check to see if handrails are loose or broken. Make sure that both sides of any steps have handrails. Any raised decks and porches should have guardrails on the edges. Have any leaves, snow, or ice cleared regularly. Use sand or salt on walking paths during winter. Clean up any spills in your garage right away. This includes oil or grease spills. What can I do in the bathroom? Use night lights. Install grab  bars by the toilet and in the tub and shower. Do not use towel bars as grab bars. Use non-skid mats or decals in the tub or shower. If you need to sit down in the shower, use a plastic, non-slip stool. Keep the floor dry. Clean up any water that spills on the floor as soon as it happens. Remove soap buildup in the tub or shower regularly. Attach bath mats securely with double-sided non-slip rug tape. Do not have throw rugs and other things on the floor that can make you trip. What can I do in the bedroom? Use night lights. Make sure that you have a light by your bed that is easy to reach. Do not use any sheets or blankets that are too big for your bed. They should not hang down onto the floor. Have a firm chair that has side arms. You can use this for support while you get dressed. Do not have throw rugs and other things on the floor that can make you trip. What can I do in the kitchen? Clean up any spills right away. Avoid walking on wet floors. Keep items that you use a lot in easy-to-reach places. If you need to reach  something above you, use a strong step stool that has a grab bar. Keep electrical cords out of the way. Do not use floor polish or wax that makes floors slippery. If you must use wax, use non-skid floor wax. Do not have throw rugs and other things on the floor that can make you trip. What can I do with my stairs? Do not leave any items on the stairs. Make sure that there are handrails on both sides of the stairs and use them. Fix handrails that are broken or loose. Make sure that handrails are as long as the stairways. Check any carpeting to make sure that it is firmly attached to the stairs. Fix any carpet that is loose or worn. Avoid having throw rugs at the top or bottom of the stairs. If you do have throw rugs, attach them to the floor with carpet tape. Make sure that you have a light switch at the top of the stairs and the bottom of the stairs. If you do not have them,  ask someone to add them for you. What else can I do to help prevent falls? Wear shoes that: Do not have high heels. Have rubber bottoms. Are comfortable and fit you well. Are closed at the toe. Do not wear sandals. If you use a stepladder: Make sure that it is fully opened. Do not climb a closed stepladder. Make sure that both sides of the stepladder are locked into place. Ask someone to hold it for you, if possible. Clearly mark and make sure that you can see: Any grab bars or handrails. First and last steps. Where the edge of each step is. Use tools that help you move around (mobility aids) if they are needed. These include: Canes. Walkers. Scooters. Crutches. Turn on the lights when you go into a dark area. Replace any light bulbs as soon as they burn out. Set up your furniture so you have a clear path. Avoid moving your furniture around. If any of your floors are uneven, fix them. If there are any pets around you, be aware of where they are. Review your medicines with your doctor. Some medicines can make you feel dizzy. This can increase your chance of falling. Ask your doctor what other things that you can do to help prevent falls. This information is not intended to replace advice given to you by your health care provider. Make sure you discuss any questions you have with your health care provider. Document Released: 02/01/2009 Document Revised: 09/13/2015 Document Reviewed: 05/12/2014 Elsevier Interactive Patient Education  2017 [employer].

## 2022-08-20 NOTE — Progress Notes (Signed)
Subjective:   Veronica Flores is a 77 y.o. female who presents for Medicare Annual (Subsequent) preventive examination.  Review of Systems    ***       Objective:    There were no vitals filed for this visit. There is no height or weight on file to calculate BMI.     08/15/2021    9:06 AM 08/09/2020   11:44 AM 07/30/2016   10:35 AM  Advanced Directives  Does Patient Have a Medical Advance Directive? Yes Yes Yes  Type of Estate agent of [place];Living will  Living will  Does patient want to make changes to medical advance directive?   No - Patient declined  Copy of Healthcare Power of Attorney in Chart? No - copy requested      Current Medications (verified) Outpatient Encounter Medications as of 08/21/2022  Medication Sig   ADVAIR DISKUS 100-50 MCG/ACT AEPB USE ONE INHALATION BY MOUTH INTO THE LUNGS 2 TIMES DAILY (Patient not taking: Reported on 08/12/2022)   albuterol (VENTOLIN HFA) 108 (90 Base) MCG/ACT inhaler Inhale 2 puffs into the lungs every 6 (six) hours as needed for wheezing or shortness of breath.   aspirin EC 81 MG tablet Take 81 mg by mouth daily. Swallow whole.   azaTHIOprine (IMURAN) 50 MG tablet Take 100 mg by mouth daily. 2 pills once a day   Calcium Carbonate-Vitamin D 600-400 MG-UNIT chew tablet Chew 1 tablet by mouth 2 (two) times daily.   cetirizine (ZYRTEC) 5 MG tablet Take 1 tablet (5 mg total) by mouth 2 (two) times daily.   ipratropium (ATROVENT) 0.06 % nasal spray USE ONE SPRAY NASALLY THREE TIMES DAILY   levocetirizine (XYZAL) 5 MG tablet Take 1 tablet (5 mg total) by mouth every evening.   meloxicam (MOBIC) 15 MG tablet Take 1 tablet (15 mg total) by mouth daily.   montelukast (SINGULAIR) 10 MG tablet TAKE 1 TABLET BY MOUTH AT BEDTIME   PREVIDENT 1.1 % GEL dental gel Place onto teeth daily.   rosuvastatin (CRESTOR) 20 MG tablet TAKE 1 TABLET BY MOUTH DAILY   valsartan (DIOVAN) 80 MG tablet Take 1 tablet (80 mg total) by mouth  daily.   verapamil (CALAN-SR) 240 MG CR tablet TAKE 1 TABLET BY MOUTH DAILY   No facility-administered encounter medications on file as of 08/21/2022.    Allergies (verified) Penicillins, Allegra [fexofenadine hydrochloride], Desloratadine, Cephalexin, and Molnupiravir   History: Past Medical History:  Diagnosis Date   Allergy    Asthma    Asthma    [employer] 02/27/2020   Autoimmune hepatitis (HCC)    Fatigue    Hypertension    Hypertrophic cardiomyopathy (HCC)    Meningioma (HCC)    s/p craniotomy and removal from right medial sphenoid wing extending into cavernous sinus   Migraine, ophthalmoplegic    Osteopenia    Past Surgical History:  Procedure Laterality Date   ANKLE SURGERY     BRAIN SURGERY     CHOLECYSTECTOMY     FRACTURE SURGERY N/A    [employer] 02/27/2020   IR RADIOLOGIST EVAL & MGMT  08/26/2016   IR VERTEBROPLASTY LUMBAR BX INC UNI/BIL INC/INJECT/IMAGING  07/30/2016   WRIST SURGERY     No family history on file. Social History   Socioeconomic History   Marital status: Married    Spouse name: Not on file   Number of children: Not on file   Years of education: Not on file   Highest education level: 12th grade  Occupational History   Not on file  Tobacco Use   Smoking status: Never   Smokeless tobacco: Never  Substance and Sexual Activity   Alcohol use: No   Drug use: No   Sexual activity: Not on file  Other Topics Concern   Not on file  Social History Narrative   Not on file   Social Determinants of Health   Financial Resource Strain: Low Risk  (08/11/2022)   Overall Financial Resource Strain (CARDIA)    Difficulty of Paying Living Expenses: Not very hard  Food Insecurity: No Food Insecurity (08/11/2022)   Hunger Vital Sign    Worried About Running Out of Food in the Last Year: Never true    Ran Out of Food in the Last Year: Never true  Transportation Needs: No Transportation Needs (08/11/2022)   PRAPARE - Administrator, Civil Service  (Medical): No    Lack of Transportation (Non-Medical): No  Physical Activity: Unknown (08/11/2022)   Exercise Vital Sign    Days of Exercise per Week: Patient declined    Minutes of Exercise per Session: Not on file  Stress: Stress Concern Present (08/11/2022)   [employer] of Occupational Health - Occupational Stress Questionnaire    Feeling of Stress : To some extent  Social Connections: Socially Integrated (08/11/2022)   Social Connection and Isolation Panel [NHANES]    Frequency of Communication with Friends and Family: More than three times a week    Frequency of Social Gatherings with Friends and Family: Once a week    Attends Religious Services: More than 4 times per year    Active Member of [employer] or Organizations: Yes    Attends Engineer, structural: More than 4 times per year    Marital Status: Married    Tobacco Counseling Counseling given: Not Answered   Clinical Intake:                 Diabetic?No          Activities of Daily Living     No data to display          Patient Care Team: Donita Brooks, MD as PCP - General (Family Medicine) Erroll Luna, [facility] as Pharmacist (Pharmacist)  Indicate any recent Medical Services you may have received from other than [facility] providers in the past year (date may be approximate).     Assessment:   This is a routine wellness examination for [name].  Hearing/Vision screen No results found.  Dietary issues and exercise activities discussed:     Goals Addressed   None    Depression Screen    08/12/2022    8:05 AM 02/10/2022    7:59 AM 08/15/2021    9:02 AM 08/09/2020    1:27 PM 08/06/2020    8:25 AM 07/13/2017    8:00 AM 12/01/2016    7:54 AM  PHQ 2/9 Scores  PHQ - 2 Score 0 0 0 0 0 0 0    Fall Risk    08/12/2022    8:05 AM 02/10/2022    7:59 AM 08/15/2021    9:06 AM 08/09/2020   11:43 AM 08/06/2020    8:25 AM  Fall Risk   Falls in the past year? 0 0 1 1 0  Number falls in  past yr: 0 0 1 0   Injury with Fall? 0 0 0 0   Risk for fall due to : No Fall Risks No Fall Risks History of  fall(s);Impaired balance/gait  No Fall Risks  Follow up Falls prevention discussed Falls prevention discussed Falls prevention discussed  Falls evaluation completed    FALL RISK PREVENTION PERTAINING TO THE HOME:  Any stairs in or around the home? {YES/NO:21197} If so, are there any without handrails? {YES/NO:21197} Home free of loose throw rugs in walkways, pet beds, electrical cords, etc? {YES/NO:21197} Adequate lighting in your home to reduce risk of falls? {YES/NO:21197}  ASSISTIVE DEVICES UTILIZED TO PREVENT FALLS:  Life alert? {YES/NO:21197} Use of a cane, walker or w/c? {YES/NO:21197} Grab bars in the bathroom? {YES/NO:21197} Shower chair or bench in shower? {YES/NO:21197} Elevated toilet seat or a handicapped toilet? {YES/NO:21197}  TIMED UP AND GO:  Was the test performed? No . Telephonic visit   Cognitive Function:        08/15/2021    9:10 AM 08/09/2020   11:42 AM  6CIT Screen  What Year? 0 points 0 points  What month? 0 points 0 points  What time? 0 points 0 points  Count back from 20 0 points 0 points  Months in reverse 0 points 0 points  Repeat phrase 0 points 0 points  Total Score 0 points 0 points    Immunizations Immunization History  Administered Date(s) Administered   Fluad Quad(high Dose 65+) 01/11/2019, 02/06/2020, 01/10/2021   Hepatitis A 12/19/2003, 10/13/2006   Hepatitis B 12/19/2003, 10/13/2006   Influenza Whole 01/02/2010   Influenza, High Dose Seasonal PF 02/05/2017, 01/14/2018, 01/20/2022   Influenza,inj,Quad PF,6+ Mos 01/27/2013, 02/09/2014, 02/08/2015, 02/07/2016   PFIZER(Purple Top)SARS-COV-2 Vaccination 05/28/2019, 06/21/2019, 01/03/2020   Pneumococcal Conjugate-13 05/27/2013   Pneumococcal Polysaccharide-23 01/20/2004, 09/02/2010, 01/14/2018   Td 04/22/2003   Tdap 03/18/2011    TDAP status: Due, Education has been  provided regarding the importance of this vaccine. Advised may receive this vaccine at local pharmacy or Health Dept. Aware to provide a copy of the vaccination record if obtained from local pharmacy or Health Dept. Verbalized acceptance and understanding.  Flu Vaccine status: Up to date  Pneumococcal vaccine status: Up to date  Covid-19 vaccine status: Information provided on how to obtain vaccines.   Qualifies for Shingles Vaccine? Yes   Zostavax completed No   Shingrix Completed?: No.    Education has been provided regarding the importance of this vaccine. Patient has been advised to call insurance company to determine out of pocket expense if they have not yet received this vaccine. Advised may also receive vaccine at local pharmacy or Health Dept. Verbalized acceptance and understanding.  Screening Tests Health Maintenance  Topic Date Due   Medicare Annual Wellness (AWV)  08/16/2022   COVID-19 Vaccine (4 - 2023-24 season) 08/28/2022 (Originally 12/20/2021)   Hepatitis C Screening  08/12/2023 (Originally 05/15/1963)   INFLUENZA VACCINE  11/20/2022   Pneumonia Vaccine 26+ Years old  Completed   DEXA SCAN  Completed   HPV VACCINES  Aged Out   DTaP/Tdap/Td  Discontinued   COLONOSCOPY (Pts 45-32yrs Insurance coverage will need to be confirmed)  Discontinued   Zoster Vaccines- Shingrix  Discontinued    Health Maintenance  Health Maintenance Due  Topic Date Due   Medicare Annual Wellness (AWV)  08/16/2022    Colorectal cancer screening: No longer required.   Mammogram status: Completed 01/21/22. Repeat every year  Bone Density status: Completed 08/20/21. Results reflect: Bone density results: OSTEOPENIA. Repeat every 2 years.  Lung Cancer Screening: (Low Dose CT Chest recommended if Age 25-80 years, 30 pack-year currently smoking OR have quit w/in 15years.) does not qualify.  Lung Cancer Screening Referral: n/a  Additional Screening:  Hepatitis C Screening: does qualify;    Vision Screening: Recommended annual ophthalmology exams for early detection of glaucoma and other disorders of the eye. Is the patient up to date with their annual eye exam?  {YES/NO:21197} Who is the provider or what is the name of the office in which the patient attends annual eye exams? *** If pt is not established with a provider, would they like to be referred to a provider to establish care? {YES/NO:21197}.   Dental Screening: Recommended annual dental exams for proper oral hygiene  Community Resource Referral / Chronic Care Management: CRR required this visit?  {YES/NO:21197}  CCM required this visit?  {YES/NO:21197}     Plan:     I have personally reviewed and noted the following in the patient's chart:   Medical and social history Use of alcohol, tobacco or illicit drugs  Current medications and supplements including opioid prescriptions. {Opioid Prescriptions:[phone]} Functional ability and status Nutritional status Physical activity Advanced directives List of other physicians Hospitalizations, surgeries, and ER visits in previous 12 months Vitals Screenings to include cognitive, depression, and falls Referrals and appointments  In addition, I have reviewed and discussed with patient certain preventive protocols, quality metrics, and best practice recommendations. A written personalized care plan for preventive services as well as general preventive health recommendations were provided to patient.     Durwin Nora, [place]   12/20/4780   Due to this being a virtual visit, the after visit summary with patients personalized plan was offered to patient via mail or my-chart. ***Patient declined at this time./ Patient would like to access on my-chart/ per request, patient was mailed a copy of AVS./ Patient preferred to pick up at office at next visit  Nurse Notes: ***

## 2022-08-21 ENCOUNTER — Ambulatory Visit (INDEPENDENT_AMBULATORY_CARE_PROVIDER_SITE_OTHER): Payer: Medicare Other

## 2022-08-21 VITALS — Ht 67.0 in | Wt 181.0 lb

## 2022-08-21 DIAGNOSIS — Z0000 Encounter for general adult medical examination without abnormal findings: Secondary | ICD-10-CM

## 2022-08-22 ENCOUNTER — Other Ambulatory Visit: Payer: Self-pay

## 2022-08-22 ENCOUNTER — Other Ambulatory Visit: Payer: Self-pay | Admitting: Family Medicine

## 2022-08-22 MED ORDER — IPRATROPIUM BROMIDE 0.06 % NA SOLN: NASAL | 3 refills | Status: DC

## 2022-11-24 ENCOUNTER — Other Ambulatory Visit: Payer: Self-pay | Admitting: Family Medicine

## 2022-11-25 NOTE — Telephone Encounter (Signed)
Requested Prescriptions  Pending Prescriptions Disp Refills   verapamil (CALAN-SR) 240 MG CR tablet [Pharmacy Med Name: VERAPAMIL ER 240MG  TABLETS] 90 tablet 0    Sig: TAKE 1 TABLET BY MOUTH DAILY     Cardiovascular: Calcium Channel Blockers 3 Failed - 11/24/2022  2:13 PM      Failed - Valid encounter within last 6 months    Recent Outpatient Visits           1 year ago Pure hypercholesterolemia   [facility] Pickard, Priscille Heidelberg, MD   1 year ago COVID-19   [facility] Valentino Nose, NP   1 year ago Primary hypertension   [facility] Tanya Nones, Priscille Heidelberg, MD   2 years ago Encounter for annual wellness exam in Medicare patient   [facility] Valentino Nose, NP   2 years ago Primary hypertension   [facility] Pickard, Priscille Heidelberg, MD       Future Appointments             In 2 months Pickard, Priscille Heidelberg, MD [facility]            Passed - ALT in normal range and within 360 days    ALT  Date Value Ref Range Status  08/12/2022 10 6 - 29 U/L Final         Passed - AST in normal range and within 360 days    AST  Date Value Ref Range Status  08/12/2022 19 10 - 35 U/L Final         Passed - Cr in normal range and within 360 days    Creat  Date Value Ref Range Status  08/12/2022 0.89 0.60 - 1.00 mg/dL Final         Passed - Last BP in normal range    BP Readings from Last 1 Encounters:  08/12/22 128/72         Passed - Last Heart Rate in normal range    Pulse Readings from Last 1 Encounters:  08/12/22 71

## 2022-12-05 ENCOUNTER — Other Ambulatory Visit: Payer: Self-pay | Admitting: Family Medicine

## 2022-12-05 DIAGNOSIS — E7800 Pure hypercholesterolemia, unspecified: Secondary | ICD-10-CM

## 2022-12-05 NOTE — Telephone Encounter (Signed)
Last OV 08/14/22 Requested Prescriptions  Pending Prescriptions Disp Refills   rosuvastatin (CRESTOR) 20 MG tablet [Pharmacy Med Name: ROSUVASTATIN 20MG  TABLETS] 90 tablet 1    Sig: TAKE 1 TABLET BY MOUTH DAILY     Cardiovascular:  Antilipid - Statins 2 Failed - 12/05/2022  9:43 AM      Failed - Valid encounter within last 12 months    Recent Outpatient Visits           1 year ago Pure hypercholesterolemia   [facility] Pickard, Priscille Heidelberg, MD   1 year ago COVID-19   [facility] Valentino Nose, NP   1 year ago Primary hypertension   [facility] Tanya Nones, Priscille Heidelberg, MD   2 years ago Encounter for annual wellness exam in Medicare patient   [facility] Medicine Valentino Nose, NP   2 years ago Primary hypertension   [facility] Family Medicine Pickard, Priscille Heidelberg, MD       Future Appointments             In 2 months Pickard, Priscille Heidelberg, MD [facility], [facility]            Failed - Lipid Panel in normal range within the last 12 months    Cholesterol  Date Value Ref Range Status  08/12/2022 92 <200 mg/dL Final   LDL Cholesterol (Calc)  Date Value Ref Range Status  08/12/2022 37 mg/dL (calc) Final    Comment:    Reference range: <100 . Desirable range <100 mg/dL for primary prevention;   <70 mg/dL for patients with CHD or diabetic patients  with > or = 2 CHD risk factors. Marland Kitchen LDL-C is now calculated using the Martin-Hopkins  calculation, which is a validated novel method providing  better accuracy than the Friedewald equation in the  estimation of LDL-C.  Horald Pollen et al. Lenox Ahr. 1610;960(45): 2061-2068  (http://education.[facility].com/faq/FAQ164)    HDL  Date Value Ref Range Status  08/12/2022 34 (L) > OR = 50 mg/dL Final   Triglycerides  Date Value Ref Range Status  08/12/2022 124 <150 mg/dL Final         Passed - Cr in normal range and within 360 days    Creat  Date  Value Ref Range Status  08/12/2022 0.89 0.60 - 1.00 mg/dL Final         Passed - Patient is not pregnant

## 2022-12-22 ENCOUNTER — Other Ambulatory Visit: Payer: Self-pay | Admitting: Family Medicine

## 2022-12-22 DIAGNOSIS — I421 Obstructive hypertrophic cardiomyopathy: Secondary | ICD-10-CM

## 2022-12-22 DIAGNOSIS — I10 Essential (primary) hypertension: Secondary | ICD-10-CM

## 2022-12-24 NOTE — Telephone Encounter (Signed)
Requested Prescriptions  Pending Prescriptions Disp Refills   valsartan (DIOVAN) 80 MG tablet [Pharmacy Med Name: VALSARTAN 80MG  TABLETS] 90 tablet 0    Sig: TAKE 1 TABLET BY MOUTH DAILY GENERIC EQUIVALENT FOR DIOVAN     Cardiovascular:  Angiotensin Receptor Blockers Failed - 12/22/2022 10:21 AM      Failed - Valid encounter within last 6 months    Recent Outpatient Visits           1 year ago Pure hypercholesterolemia   [facility] Pickard, Priscille Heidelberg, MD   1 year ago COVID-19   [facility] Medicine Valentino Nose, NP   1 year ago Primary hypertension   [facility] Medicine Tanya Nones, Priscille Heidelberg, MD   2 years ago Encounter for annual wellness exam in Medicare patient   [facility] Medicine Valentino Nose, NP   2 years ago Primary hypertension   [facility] Family Medicine Donita Brooks, MD       Future Appointments             In 1 month Pickard, Priscille Heidelberg, MD [facility], [facility]            Passed - Cr in normal range and within 180 days    Creat  Date Value Ref Range Status  08/12/2022 0.89 0.60 - 1.00 mg/dL Final         Passed - K in normal range and within 180 days    Potassium  Date Value Ref Range Status  08/12/2022 4.2 3.5 - 5.3 mmol/L Final         Passed - Patient is not pregnant      Passed - Last BP in normal range    BP Readings from Last 1 Encounters:  08/12/22 128/72

## 2022-12-28 ENCOUNTER — Encounter: Payer: Self-pay | Admitting: Family Medicine

## 2023-01-16 DIAGNOSIS — H04223 Epiphora due to insufficient drainage, bilateral lacrimal glands: Secondary | ICD-10-CM | POA: Diagnosis not present

## 2023-01-16 DIAGNOSIS — H524 Presbyopia: Secondary | ICD-10-CM | POA: Diagnosis not present

## 2023-01-16 DIAGNOSIS — H2513 Age-related nuclear cataract, bilateral: Secondary | ICD-10-CM | POA: Diagnosis not present

## 2023-01-20 ENCOUNTER — Ambulatory Visit (INDEPENDENT_AMBULATORY_CARE_PROVIDER_SITE_OTHER): Payer: Medicare Other

## 2023-01-20 DIAGNOSIS — Z23 Encounter for immunization: Secondary | ICD-10-CM | POA: Diagnosis not present

## 2023-01-27 DIAGNOSIS — Z1231 Encounter for screening mammogram for malignant neoplasm of breast: Secondary | ICD-10-CM | POA: Diagnosis not present

## 2023-01-27 LAB — HM MAMMOGRAPHY

## 2023-02-03 DIAGNOSIS — K754 Autoimmune hepatitis: Secondary | ICD-10-CM | POA: Diagnosis not present

## 2023-02-09 ENCOUNTER — Encounter: Payer: Self-pay | Admitting: Family Medicine

## 2023-02-12 ENCOUNTER — Ambulatory Visit (INDEPENDENT_AMBULATORY_CARE_PROVIDER_SITE_OTHER): Payer: Medicare Other | Admitting: Family Medicine

## 2023-02-12 VITALS — BP 120/62 | HR 76 | Temp 98.0°F | Ht 67.0 in | Wt 173.4 lb

## 2023-02-12 DIAGNOSIS — K754 Autoimmune hepatitis: Secondary | ICD-10-CM | POA: Diagnosis not present

## 2023-02-12 DIAGNOSIS — R5382 Chronic fatigue, unspecified: Secondary | ICD-10-CM | POA: Diagnosis not present

## 2023-02-12 DIAGNOSIS — E7800 Pure hypercholesterolemia, unspecified: Secondary | ICD-10-CM | POA: Diagnosis not present

## 2023-02-12 DIAGNOSIS — I10 Essential (primary) hypertension: Secondary | ICD-10-CM

## 2023-02-12 NOTE — Progress Notes (Signed)
Subjective:    Patient ID: Veronica Flores, female    DOB: July 03, 1945, 77 y.o.   MRN: [id number]  Patient is a very pleasant 77 year old Caucasian female here today for a checkup.  Patient is under a lot of stress caring for her husband who has severe Parkinson's disease.  She reports feeling very tired.  She assumes that it is due to stress.  She denies chest pain or shortness of breath or dyspnea on exertion.  She denies melena or hematochezia.  GI recently checked a CBC and a CMP that were normal.  She does complain of hoarseness x 2 weeks.  She denies any dysphagia or odynophagia.  She denies any hematemesis.  She denies any sore throat.  She denies any fevers or chills but she does complain of acid reflux.  She denies drainage. Past Medical History:  Diagnosis Date   Allergy    Asthma    Asthma    Phreesia 02/27/2020   Autoimmune hepatitis (HCC)    Fatigue    Hypertension    Hypertrophic cardiomyopathy (HCC)    Meningioma (HCC)    s/p craniotomy and removal from right medial sphenoid wing extending into cavernous sinus   Migraine, ophthalmoplegic    Osteopenia    Past Surgical History:  Procedure Laterality Date   ANKLE SURGERY     BRAIN SURGERY     CHOLECYSTECTOMY     FRACTURE SURGERY N/A    Phreesia 02/27/2020   IR RADIOLOGIST EVAL & MGMT  08/26/2016   IR VERTEBROPLASTY LUMBAR BX INC UNI/BIL INC/INJECT/IMAGING  07/30/2016   WRIST SURGERY     Current Outpatient Medications on File Prior to Visit  Medication Sig Dispense Refill   ADVAIR DISKUS 100-50 MCG/ACT AEPB USE ONE INHALATION BY MOUTH INTO THE LUNGS 2 TIMES DAILY 180 each 3   albuterol (VENTOLIN HFA) 108 (90 Base) MCG/ACT inhaler Inhale 2 puffs into the lungs every 6 (six) hours as needed for wheezing or shortness of breath. 8 g 0   aspirin EC 81 MG tablet Take 81 mg by mouth daily. Swallow whole.     azaTHIOprine (IMURAN) 50 MG tablet Take 100 mg by mouth daily. 2 pills once a day     Calcium Carbonate-Vitamin D 600-400  MG-UNIT chew tablet Chew 1 tablet by mouth 2 (two) times daily.     cetirizine (ZYRTEC) 5 MG tablet Take 1 tablet (5 mg total) by mouth 2 (two) times daily. 20 tablet 0   ipratropium (ATROVENT) 0.06 % nasal spray USE ONE SPRAY NASALLY THREE TIMES DAILY 45 mL 3   levocetirizine (XYZAL) 5 MG tablet Take 1 tablet (5 mg total) by mouth every evening. 90 tablet 3   meloxicam (MOBIC) 15 MG tablet Take 1 tablet (15 mg total) by mouth daily. 90 tablet 1   montelukast (SINGULAIR) 10 MG tablet TAKE 1 TABLET BY MOUTH AT BEDTIME 90 tablet 3   PREVIDENT 1.1 % GEL dental gel Place onto teeth daily.     rosuvastatin (CRESTOR) 20 MG tablet TAKE 1 TABLET BY MOUTH DAILY 90 tablet 0   valsartan (DIOVAN) 80 MG tablet TAKE 1 TABLET BY MOUTH DAILY GENERIC EQUIVALENT FOR DIOVAN 90 tablet 0   verapamil (CALAN-SR) 240 MG CR tablet TAKE 1 TABLET BY MOUTH DAILY 90 tablet 0   No current facility-administered medications on file prior to visit.   Allergies  Allergen Reactions   Penicillins Anaphylaxis   Allegra [Fexofenadine Hydrochloride]     palpitations   Desloratadine  Palpitations    Cephalexin Hives and Rash   Molnupiravir Rash   Social History   Socioeconomic History   Marital status: Married    Spouse name: Not on file   Number of children: Not on file   Years of education: Not on file   Highest education level: 12th grade  Occupational History   Not on file  Tobacco Use   Smoking status: Never   Smokeless tobacco: Never  Substance and Sexual Activity   Alcohol use: No   Drug use: No   Sexual activity: Not on file  Other Topics Concern   Not on file  Social History Narrative   Not on file   Social Determinants of Health   Financial Resource Strain: Low Risk  (02/10/2023)   Overall Financial Resource Strain (CARDIA)    Difficulty of Paying Living Expenses: Not very hard  Food Insecurity: No Food Insecurity (02/10/2023)   Hunger Vital Sign    Worried About Running Out of Food in the  Last Year: Never true    Ran Out of Food in the Last Year: Never true  Transportation Needs: No Transportation Needs (02/10/2023)   PRAPARE - Administrator, Civil Service (Medical): No    Lack of Transportation (Non-Medical): No  Physical Activity: Unknown (02/10/2023)   Exercise Vital Sign    Days of Exercise per Week: Patient declined    Minutes of Exercise per Session: 30 min  Stress: Stress Concern Present (02/10/2023)   [employer] of Occupational Health - Occupational Stress Questionnaire    Feeling of Stress : To some extent  Social Connections: Socially Integrated (02/10/2023)   Social Connection and Isolation Panel [NHANES]    Frequency of Communication with Friends and Family: More than three times a week    Frequency of Social Gatherings with Friends and Family: Patient declined    Attends Religious Services: More than 4 times per year    Active Member of [employer] or Organizations: Yes    Attends Engineer, structural: More than 4 times per year    Marital Status: Married  Catering manager Violence: Not At Risk (08/21/2022)   Humiliation, Afraid, Rape, and Kick questionnaire    Fear of Current or Ex-Partner: No    Emotionally Abused: No    Physically Abused: No    Sexually Abused: No     Review of Systems  All other systems reviewed and are negative.      Objective:   Physical Exam Vitals reviewed.  Constitutional:      General: She is not in acute distress.    Appearance: She is well-developed. She is not diaphoretic.  HENT:     Head: Normocephalic and atraumatic.     Nose: Nose normal.     Mouth/Throat:     Pharynx: No oropharyngeal exudate.  Eyes:     Conjunctiva/sclera: Conjunctivae normal.  Cardiovascular:     Rate and Rhythm: Normal rate and regular rhythm.     Heart sounds: Normal heart sounds. No murmur heard. Pulmonary:     Effort: Pulmonary effort is normal. No respiratory distress.     Breath sounds: Normal breath sounds.  No wheezing or rales.  Abdominal:     General: Bowel sounds are normal. There is no distension.     Palpations: Abdomen is soft.     Tenderness: There is no abdominal tenderness. There is no guarding or rebound.  Musculoskeletal:     Cervical back: Neck supple.  Lymphadenopathy:  Cervical: No cervical adenopathy.  Neurological:     Mental Status: She is alert and oriented to person, place, and time.     Cranial Nerves: No cranial nerve deficit.     Motor: No abnormal muscle tone.     Coordination: Coordination normal.     Deep Tendon Reflexes: Reflexes are normal and symmetric.  Psychiatric:        Behavior: Behavior normal.        Thought Content: Thought content normal.        Judgment: Judgment normal.           Assessment & Plan:  Autoimmune hepatitis (HCC) - Plan: COMPLETE METABOLIC PANEL WITH GFR, Lipid panel, TSH, CANCELED: CBC with Differential/Platelet  Pure hypercholesterolemia - Plan: COMPLETE METABOLIC PANEL WITH GFR, Lipid panel, TSH, CANCELED: CBC with Differential/Platelet  Primary hypertension - Plan: COMPLETE METABOLIC PANEL WITH GFR, Lipid panel, TSH, CANCELED: CBC with Differential/Platelet  Chronic fatigue - Plan: Vitamin B12 I believe her fatigue is related to stress.  However I will check a thyroid along with a vitamin B12.  The remainder of her review of systems is negative.  I suspect viral laryngitis but if the hoarseness has not improved we will try a proton pump inhibitor.  If after 4 weeks, the hoarseness is not better I would recommend ENT consultation for laryngoscopy.  Check CBC, CMP, and lipid panel today.  Immunizations are up-to-date.  Blood pressure is excellent.

## 2023-02-13 LAB — COMPLETE METABOLIC PANEL WITH GFR
AG Ratio: 1.9 (calc) (ref 1.0–2.5)
ALT: 17 U/L (ref 6–29)
AST: 22 U/L (ref 10–35)
Albumin: 4.6 g/dL (ref 3.6–5.1)
Alkaline phosphatase (APISO): 54 U/L (ref 37–153)
BUN/Creatinine Ratio: 18 (calc) (ref 6–22)
BUN: 19 mg/dL (ref 7–25)
CO2: 20 mmol/L (ref 20–32)
Calcium: 10 mg/dL (ref 8.6–10.4)
Chloride: 107 mmol/L (ref 98–110)
Creat: 1.03 mg/dL — ABNORMAL HIGH (ref 0.60–1.00)
Globulin: 2.4 g/dL (ref 1.9–3.7)
Glucose, Bld: 102 mg/dL — ABNORMAL HIGH (ref 65–99)
Potassium: 4.1 mmol/L (ref 3.5–5.3)
Sodium: 139 mmol/L (ref 135–146)
Total Bilirubin: 0.6 mg/dL (ref 0.2–1.2)
Total Protein: 7 g/dL (ref 6.1–8.1)
eGFR: 56 mL/min/{1.73_m2} — ABNORMAL LOW (ref >=60)

## 2023-02-13 LAB — LIPID PANEL
Cholesterol: 109 mg/dL (ref <200)
HDL: 36 mg/dL — ABNORMAL LOW (ref >=50)
LDL Cholesterol (Calc): 52 mg/dL
Non-HDL Cholesterol (Calc): 73 mg/dL (ref <130)
Total CHOL/HDL Ratio: 3 (calc) (ref <5.0)
Triglycerides: 126 mg/dL (ref <150)

## 2023-02-13 LAB — TSH: TSH: 1.95 m[IU]/L (ref 0.40–4.50)

## 2023-02-13 LAB — VITAMIN B12: Vitamin B-12: 264 pg/mL (ref 200–1100)

## 2023-02-23 ENCOUNTER — Other Ambulatory Visit: Payer: Self-pay | Admitting: Family Medicine

## 2023-02-23 DIAGNOSIS — E7800 Pure hypercholesterolemia, unspecified: Secondary | ICD-10-CM

## 2023-02-24 NOTE — Telephone Encounter (Signed)
Requested Prescriptions  Pending Prescriptions Disp Refills   rosuvastatin (CRESTOR) 20 MG tablet [Pharmacy Med Name: ROSUVASTATIN 20MG  TABLETS] 90 tablet 0    Sig: TAKE 1 TABLET BY MOUTH DAILY     Cardiovascular:  Antilipid - Statins 2 Failed - 02/23/2023 10:17 AM      Failed - Cr in normal range and within 360 days    Creat  Date Value Ref Range Status  02/12/2023 1.03 (H) 0.60 - 1.00 mg/dL Final         Failed - Valid encounter within last 12 months    Recent Outpatient Visits           1 year ago Pure hypercholesterolemia   [facility] Medicine Pickard, Priscille Heidelberg, MD   1 year ago COVID-19   [facility] Medicine Valentino Nose, NP   2 years ago Primary hypertension   [facility] Medicine Tanya Nones, Priscille Heidelberg, MD   2 years ago Encounter for annual wellness exam in Medicare patient   [facility] Medicine Valentino Nose, NP   2 years ago Primary hypertension   [facility] Medicine Pickard, Priscille Heidelberg, MD       Future Appointments             In 5 months Pickard, Priscille Heidelberg, MD [facility], [facility]            Failed - Lipid Panel in normal range within the last 12 months    Cholesterol  Date Value Ref Range Status  02/12/2023 109 <200 mg/dL Final   LDL Cholesterol (Calc)  Date Value Ref Range Status  02/12/2023 52 mg/dL (calc) Final    Comment:    Reference range: <100 . Desirable range <100 mg/dL for primary prevention;   <70 mg/dL for patients with CHD or diabetic patients  with > or = 2 CHD risk factors. Marland Kitchen LDL-C is now calculated using the Martin-Hopkins  calculation, which is a validated novel method providing  better accuracy than the Friedewald equation in the  estimation of LDL-C.  Horald Pollen et al. Lenox Ahr. 5409;811(91): 2061-2068  (http://education.[facility].com/faq/FAQ164)    HDL  Date Value Ref Range Status  02/12/2023 36 (L) > OR = 50 mg/dL Final   Triglycerides  Date  Value Ref Range Status  02/12/2023 126 <150 mg/dL Final         Passed - Patient is not pregnant

## 2023-03-04 ENCOUNTER — Other Ambulatory Visit: Payer: Self-pay | Admitting: Family Medicine

## 2023-03-05 ENCOUNTER — Encounter: Payer: Self-pay | Admitting: Family Medicine

## 2023-03-05 ENCOUNTER — Other Ambulatory Visit: Payer: Self-pay

## 2023-03-05 DIAGNOSIS — I421 Obstructive hypertrophic cardiomyopathy: Secondary | ICD-10-CM

## 2023-03-05 DIAGNOSIS — I10 Essential (primary) hypertension: Secondary | ICD-10-CM

## 2023-03-05 MED ORDER — VERAPAMIL HCL ER 240 MG PO TBCR: 240.0000 mg | EXTENDED_RELEASE_TABLET | Freq: Every day | ORAL | 1 refills | Status: DC

## 2023-03-05 NOTE — Telephone Encounter (Signed)
Requested Prescriptions  Pending Prescriptions Disp Refills   verapamil (CALAN-SR) 240 MG CR tablet [Pharmacy Med Name: VERAPAMIL ER 240MG  TABLETS] 90 tablet 0    Sig: TAKE 1 TABLET BY MOUTH DAILY     Cardiovascular: Calcium Channel Blockers 3 Failed - 03/04/2023  7:33 PM      Failed - Cr in normal range and within 360 days    Creat  Date Value Ref Range Status  02/12/2023 1.03 (H) 0.60 - 1.00 mg/dL Final         Failed - Valid encounter within last 6 months    Recent Outpatient Visits           1 year ago Pure hypercholesterolemia   [facility] Medicine Pickard, Priscille Heidelberg, MD   1 year ago COVID-19   [facility] Valentino Nose, NP   2 years ago Primary hypertension   [facility] Family Medicine Tanya Nones, Priscille Heidelberg, MD   2 years ago Encounter for annual wellness exam in Medicare patient   [facility] Medicine Valentino Nose, NP   2 years ago Primary hypertension   [facility] Medicine Tanya Nones, Priscille Heidelberg, MD       Future Appointments             In 5 months Pickard, Priscille Heidelberg, MD [facility] Medicine, [facility]            Passed - ALT in normal range and within 360 days    ALT  Date Value Ref Range Status  02/12/2023 17 6 - 29 U/L Final         Passed - AST in normal range and within 360 days    AST  Date Value Ref Range Status  02/12/2023 22 10 - 35 U/L Final         Passed - Last BP in normal range    BP Readings from Last 1 Encounters:  02/12/23 120/62         Passed - Last Heart Rate in normal range    Pulse Readings from Last 1 Encounters:  02/12/23 76

## 2023-03-13 ENCOUNTER — Other Ambulatory Visit: Payer: Self-pay | Admitting: Family Medicine

## 2023-03-13 DIAGNOSIS — I10 Essential (primary) hypertension: Secondary | ICD-10-CM

## 2023-03-13 DIAGNOSIS — I421 Obstructive hypertrophic cardiomyopathy: Secondary | ICD-10-CM

## 2023-03-16 NOTE — Telephone Encounter (Signed)
Requested medication (s) are due for refill today: yes  Requested medication (s) are on the active medication list: yes  Last refill:  12/24/22 #90  Future visit scheduled: yes  Notes to clinic:  Cr out of range   Requested Prescriptions  Pending Prescriptions Disp Refills   valsartan (DIOVAN) 80 MG tablet [Pharmacy Med Name: VALSARTAN 80MG  TABLETS] 90 tablet 0    Sig: TAKE 1 TABLET BY MOUTH DAILY GENERIC EQUIVALENT FOR DIOVAN     Cardiovascular:  Angiotensin Receptor Blockers Failed - 03/13/2023  8:39 AM      Failed - Cr in normal range and within 180 days    Creat  Date Value Ref Range Status  02/12/2023 1.03 (H) 0.60 - 1.00 mg/dL Final         Failed - Valid encounter within last 6 months    Recent Outpatient Visits           1 year ago Pure hypercholesterolemia   [facility] Medicine Pickard, Priscille Heidelberg, MD   1 year ago COVID-19   [facility] Medicine Valentino Nose, NP   2 years ago Primary hypertension   [facility] Family Medicine Tanya Nones, Priscille Heidelberg, MD   2 years ago Encounter for annual wellness exam in Medicare patient   [facility] Family Medicine Valentino Nose, NP   2 years ago Primary hypertension   [facility] Medicine Donita Brooks, MD       Future Appointments             In 5 months Pickard, Priscille Heidelberg, MD [facility], [facility]            Passed - K in normal range and within 180 days    Potassium  Date Value Ref Range Status  02/12/2023 4.1 3.5 - 5.3 mmol/L Final         Passed - Patient is not pregnant      Passed - Last BP in normal range    BP Readings from Last 1 Encounters:  02/12/23 120/62

## 2023-03-17 ENCOUNTER — Encounter: Payer: Self-pay | Admitting: Family Medicine

## 2023-03-17 ENCOUNTER — Other Ambulatory Visit: Payer: Self-pay

## 2023-03-17 DIAGNOSIS — I10 Essential (primary) hypertension: Secondary | ICD-10-CM

## 2023-03-17 DIAGNOSIS — I421 Obstructive hypertrophic cardiomyopathy: Secondary | ICD-10-CM

## 2023-03-17 MED ORDER — VALSARTAN 80 MG PO TABS: 80.0000 mg | ORAL_TABLET | Freq: Every day | ORAL | 0 refills | Status: DC

## 2023-04-19 ENCOUNTER — Encounter: Payer: Self-pay | Admitting: Family Medicine

## 2023-04-30 ENCOUNTER — Other Ambulatory Visit: Payer: Self-pay

## 2023-04-30 ENCOUNTER — Other Ambulatory Visit: Payer: Self-pay | Admitting: Family Medicine

## 2023-04-30 ENCOUNTER — Encounter: Payer: Self-pay | Admitting: Family Medicine

## 2023-04-30 DIAGNOSIS — R051 Acute cough: Secondary | ICD-10-CM

## 2023-04-30 DIAGNOSIS — J45909 Unspecified asthma, uncomplicated: Secondary | ICD-10-CM

## 2023-05-04 ENCOUNTER — Encounter: Payer: Self-pay | Admitting: Family Medicine

## 2023-05-05 ENCOUNTER — Encounter: Payer: Self-pay | Admitting: Family Medicine

## 2023-05-05 ENCOUNTER — Ambulatory Visit (INDEPENDENT_AMBULATORY_CARE_PROVIDER_SITE_OTHER): Payer: Medicare Other | Admitting: Family Medicine

## 2023-05-05 VITALS — BP 122/78 | HR 76 | Temp 98.1°F | Ht 67.0 in | Wt 174.1 lb

## 2023-05-05 DIAGNOSIS — J069 Acute upper respiratory infection, unspecified: Secondary | ICD-10-CM

## 2023-05-05 MED ORDER — PREDNISONE 20 MG PO TABS: ORAL_TABLET | ORAL | 0 refills | Status: DC

## 2023-05-05 MED ORDER — AZITHROMYCIN 250 MG PO TABS: ORAL_TABLET | ORAL | 0 refills | Status: DC

## 2023-05-05 MED ORDER — HYDROCODONE BIT-HOMATROP MBR 5-1.5 MG/5ML PO SOLN: 5.0000 mL | Freq: Three times a day (TID) | ORAL | 0 refills | Status: DC | PRN

## 2023-05-05 NOTE — Progress Notes (Signed)
 Subjective:    Patient ID: Veronica Flores, female    DOB: 25-Apr-1945, 78 y.o.   MRN: [id number] Patient presents today with 5-day history of head congestion, rhinorrhea, cough.  She states this morning when she blew her nose, the mucus was green and bloody.  She denies any headache.  She denies any sinus pain.  She denies any ear pain.  She denies any sore throat.  She is wheezing.  She does have rhonchorous breath sounds.  She has a history of asthma. Past Medical History:  Diagnosis Date   Allergy    Asthma    Asthma    Phreesia 02/27/2020   Autoimmune hepatitis (HCC)    Fatigue    Hypertension    Hypertrophic cardiomyopathy (HCC)    Meningioma (HCC)    s/p craniotomy and removal from right medial sphenoid wing extending into cavernous sinus   Migraine, ophthalmoplegic    Osteopenia    Past Surgical History:  Procedure Laterality Date   ANKLE SURGERY     BRAIN SURGERY     CHOLECYSTECTOMY     FRACTURE SURGERY N/A    Phreesia 02/27/2020   IR RADIOLOGIST EVAL & MGMT  08/26/2016   IR VERTEBROPLASTY LUMBAR BX INC UNI/BIL INC/INJECT/IMAGING  07/30/2016   WRIST SURGERY     Current Outpatient Medications on File Prior to Visit  Medication Sig Dispense Refill   albuterol  (VENTOLIN  HFA) 108 (90 Base) MCG/ACT inhaler Inhale 2 puffs into the lungs every 6 (six) hours as needed for wheezing or shortness of breath. 8 g 0   aspirin EC 81 MG tablet Take 81 mg by mouth daily. Swallow whole.     azaTHIOprine (IMURAN) 50 MG tablet Take 100 mg by mouth daily. 2 pills once a day     Calcium  Carbonate-Vitamin D 600-400 MG-UNIT chew tablet Chew 1 tablet by mouth 2 (two) times daily.     fluticasone -salmeterol (ADVAIR) 100-50 MCG/ACT AEPB USE ONE INHALATION BY MOUTH INTO THE LUNGS 2 TIMES DAILY. GENERIC EQUIVALENT FOR ADVAIR DISKUS 180 each 3   ipratropium (ATROVENT ) 0.06 % nasal spray USE ONE SPRAY NASALLY THREE TIMES DAILY 45 mL 3   levocetirizine (XYZAL ) 5 MG tablet Take 1 tablet (5 mg total) by  mouth every evening. 90 tablet 3   meloxicam  (MOBIC ) 15 MG tablet Take 1 tablet (15 mg total) by mouth daily. 90 tablet 1   montelukast  (SINGULAIR ) 10 MG tablet TAKE 1 TABLET BY MOUTH AT BEDTIME 90 tablet 3   PREVIDENT 1.1 % GEL dental gel Place onto teeth daily.     rosuvastatin  (CRESTOR ) 20 MG tablet TAKE 1 TABLET BY MOUTH DAILY 90 tablet 0   valsartan  (DIOVAN ) 80 MG tablet Take 1 tablet (80 mg total) by mouth daily. 90 tablet 0   verapamil  (CALAN -SR) 240 MG CR tablet Take 1 tablet (240 mg total) by mouth daily. 90 tablet 1   cetirizine  (ZYRTEC ) 5 MG tablet Take 1 tablet (5 mg total) by mouth 2 (two) times daily. (Patient not taking: Reported on 05/05/2023) 20 tablet 0   No current facility-administered medications on file prior to visit.   Allergies  Allergen Reactions   Penicillins Anaphylaxis   Allegra [Fexofenadine Hydrochloride]     palpitations   Desloratadine     Palpitations    Cephalexin Hives and Rash   Molnupiravir  Rash   Social History   Socioeconomic History   Marital status: Married    Spouse name: Not on file   Number of children:  Not on file   Years of education: Not on file   Highest education level: 12th grade  Occupational History   Not on file  Tobacco Use   Smoking status: Never   Smokeless tobacco: Never  Substance and Sexual Activity   Alcohol use: No   Drug use: No   Sexual activity: Not on file  Other Topics Concern   Not on file  Social History Narrative   Not on file   Social Drivers of Health   Financial Resource Strain: Low Risk  (02/10/2023)   Overall Financial Resource Strain (CARDIA)    Difficulty of Paying Living Expenses: Not very hard  Food Insecurity: No Food Insecurity (02/10/2023)   Hunger Vital Sign    Worried About Running Out of Food in the Last Year: Never true    Ran Out of Food in the Last Year: Never true  Transportation Needs: No Transportation Needs (02/10/2023)   PRAPARE - Administrator, Civil Service  (Medical): No    Lack of Transportation (Non-Medical): No  Physical Activity: Unknown (02/10/2023)   Exercise Vital Sign    Days of Exercise per Week: Patient declined    Minutes of Exercise per Session: 30 min  Stress: Stress Concern Present (02/10/2023)   [employer] of Occupational Health - Occupational Stress Questionnaire    Feeling of Stress : To some extent  Social Connections: Socially Integrated (02/10/2023)   Social Connection and Isolation Panel [NHANES]    Frequency of Communication with Friends and Family: More than three times a week    Frequency of Social Gatherings with Friends and Family: Patient declined    Attends Religious Services: More than 4 times per year    Active Member of [employer] or Organizations: Yes    Attends Engineer, Structural: More than 4 times per year    Marital Status: Married  Catering Manager Violence: Not At Risk (08/21/2022)   Humiliation, Afraid, Rape, and Kick questionnaire    Fear of Current or Ex-Partner: No    Emotionally Abused: No    Physically Abused: No    Sexually Abused: No     Review of Systems  All other systems reviewed and are negative.      Objective:   Physical Exam Vitals reviewed.  Constitutional:      General: She is not in acute distress.    Appearance: She is well-developed. She is not diaphoretic.  HENT:     Head: Normocephalic and atraumatic.     Right Ear: Tympanic membrane and ear canal normal.     Left Ear: Tympanic membrane and ear canal normal.     Nose: Rhinorrhea present.     Mouth/Throat:     Pharynx: No oropharyngeal exudate.  Eyes:     Conjunctiva/sclera: Conjunctivae normal.  Cardiovascular:     Rate and Rhythm: Normal rate and regular rhythm.     Heart sounds: Normal heart sounds. No murmur heard. Pulmonary:     Effort: Pulmonary effort is normal. No respiratory distress.     Breath sounds: Wheezing and rhonchi present. No rales.  Musculoskeletal:     Cervical back: Neck  supple.  Lymphadenopathy:     Cervical: No cervical adenopathy.  Neurological:     Mental Status: She is alert and oriented to person, place, and time.     Cranial Nerves: No cranial nerve deficit.     Motor: No abnormal muscle tone.     Coordination: Coordination normal.  Deep Tendon Reflexes: Reflexes are normal and symmetric.  Psychiatric:        Behavior: Behavior normal.        Thought Content: Thought content normal.        Judgment: Judgment normal.           Assessment & Plan:  Upper respiratory tract infection, unspecified type Patient has a viral upper respiratory infection.  I recommended using Mucinex for chest congestion.  She can supplement with Hycodan 1 teaspoon every 8 hours as needed for cough.  Due to the wheezing and bronchospasm I have recommended that she use albuterol  2 puffs every 6 hours and also start a prednisone  taper pack.  If she develops high fever or purulent sputum I gave her prescription for a Z-Pak with strict instructions not to fill this unless symptoms worsen.  I believe symptoms should gradually improve over the next 5 days.

## 2023-06-08 ENCOUNTER — Other Ambulatory Visit: Payer: Self-pay

## 2023-06-08 ENCOUNTER — Encounter: Payer: Self-pay | Admitting: Family Medicine

## 2023-06-08 DIAGNOSIS — I421 Obstructive hypertrophic cardiomyopathy: Secondary | ICD-10-CM

## 2023-06-08 DIAGNOSIS — I10 Essential (primary) hypertension: Secondary | ICD-10-CM

## 2023-06-08 MED ORDER — VALSARTAN 80 MG PO TABS: 80.0000 mg | ORAL_TABLET | Freq: Every day | ORAL | 0 refills | Status: DC

## 2023-07-01 ENCOUNTER — Other Ambulatory Visit: Payer: Self-pay

## 2023-07-01 DIAGNOSIS — E7800 Pure hypercholesterolemia, unspecified: Secondary | ICD-10-CM

## 2023-07-01 MED ORDER — ROSUVASTATIN CALCIUM 20 MG PO TABS: 20.0000 mg | ORAL_TABLET | Freq: Every day | ORAL | 1 refills | Status: DC

## 2023-08-13 ENCOUNTER — Ambulatory Visit: Payer: Medicare Other | Admitting: Family Medicine

## 2023-08-13 ENCOUNTER — Encounter: Payer: Self-pay | Admitting: Family Medicine

## 2023-08-13 VITALS — BP 120/64 | HR 69 | Ht 67.0 in | Wt 171.4 lb

## 2023-08-13 DIAGNOSIS — I421 Obstructive hypertrophic cardiomyopathy: Secondary | ICD-10-CM

## 2023-08-13 DIAGNOSIS — E7800 Pure hypercholesterolemia, unspecified: Secondary | ICD-10-CM | POA: Diagnosis not present

## 2023-08-13 DIAGNOSIS — I10 Essential (primary) hypertension: Secondary | ICD-10-CM | POA: Diagnosis not present

## 2023-08-13 MED ORDER — ESCITALOPRAM OXALATE 10 MG PO TABS: 10.0000 mg | ORAL_TABLET | Freq: Every day | ORAL | 3 refills | Status: DC

## 2023-08-13 NOTE — Progress Notes (Signed)
 Subjective:    Patient ID: Veronica Flores, female    DOB: [birth year], 78 y.o.   MRN: [id number]  Patient is a very pleasant 78 year old Caucasian female here today for a checkup.  Patient is battling depression.  She is very tearful today.  She feels sad most days.  She does not want to get out of bed in the morning.  She describes most days of the grocery.  Her husband has end-stage Parkinson's disease and she is under mended stress caring for him.  She does report being sad on a daily basis but denies any suicidal thoughts.  She denies any chest pain shortness of breath or dyspnea on exertion. Past Medical History:  Diagnosis Date   Allergy    Asthma    Asthma    Phreesia 02/27/2020   Autoimmune hepatitis (HCC)    Fatigue    Hypertension    Hypertrophic cardiomyopathy (HCC)    Meningioma (HCC)    s/p craniotomy and removal from right medial sphenoid wing extending into cavernous sinus   Migraine, ophthalmoplegic    Osteopenia    Past Surgical History:  Procedure Laterality Date   ANKLE SURGERY     BRAIN SURGERY     CHOLECYSTECTOMY     FRACTURE SURGERY N/A    [facility]reesia 02/27/2020   IR RADIOLOGIST EVAL & MGMT  08/26/2016   IR VERTEBROPLASTY LUMBAR BX INC UNI/BIL INC/INJECT/IMAGING  07/30/2016   WRIST SURGERY     Current Outpatient Medications on File Prior to Visit  Medication Sig Dispense Refill   albuterol  (VENTOLIN  HFA) 108 (90 Base) MCG/ACT inhaler Inhale 2 puffs into the lungs every 6 (six) hours as needed for wheezing or shortness of breath. 8 g 0   aspirin EC 81 MG tablet Take 81 mg by mouth daily. Swallow whole.     azaTHIOprine (IMURAN) 50 MG tablet Take 100 mg by mouth daily. 2 pills once a day     azithromycin  (ZITHROMAX ) 250 MG tablet 2 tabs poqday1, 1 tab poqday 2-5 6 tablet 0   Calcium  Carbonate-Vitamin D 600-400 MG-UNIT chew tablet Chew 1 tablet by mouth 2 (two) times daily.     cetirizine  (ZYRTEC ) 5 MG tablet Take 1 tablet (5 mg total) by mouth 2 (two) times daily.  (Patient not taking: Reported on 05/05/2023) 20 tablet 0   fluticasone -salmeterol (ADVAIR) 100-50 MCG/ACT AEPB USE ONE INHALATION BY MOUTH INTO THE LUNGS 2 TIMES DAILY. GENERIC EQUIVALENT FOR ADVAIR DISKUS 180 each 3   HYDROcodone  bit-homatropine (HYCODAN) 5-1.5 MG/5ML syrup Take 5 mLs by mouth every 8 (eight) hours as needed for cough. 120 mL 0   ipratropium (ATROVENT ) 0.06 % nasal spray USE ONE SPRAY NASALLY THREE TIMES DAILY 45 mL 3   levocetirizine (XYZAL ) 5 MG tablet Take 1 tablet (5 mg total) by mouth every evening. 90 tablet 3   meloxicam  (MOBIC ) 15 MG tablet Take 1 tablet (15 mg total) by mouth daily. 90 tablet 1   montelukast  (SINGULAIR ) 10 MG tablet TAKE 1 TABLET BY MOUTH AT BEDTIME 90 tablet 3   predniSONE  (DELTASONE ) 20 MG tablet 3 tabs poqday 1-2, 2 tabs poqday 3-4, 1 tab poqday 5-6 12 tablet 0   PREVIDENT 1.1 % GEL dental gel Place onto teeth daily.     rosuvastatin  (CRESTOR ) 20 MG tablet Take 1 tablet (20 mg total) by mouth daily. 90 tablet 1   valsartan  (DIOVAN ) 80 MG tablet Take 1 tablet (80 mg total) by mouth daily. 90 tablet 0  verapamil  (CALAN -SR) 240 MG CR tablet Take 1 tablet (240 mg total) by mouth daily. 90 tablet 1   No current facility-administered medications on file prior to visit.   Allergies  Allergen Reactions   Penicillins Anaphylaxis   Allegra [Fexofenadine Hydrochloride]     palpitations   Desloratadine     Palpitations    Cephalexin Hives and Rash   Molnupiravir  Rash   Social History   Socioeconomic History   Marital status: Married    Spouse name: Not on file   Number of children: Not on file   Years of education: Not on file   Highest education level: 12th grade  Occupational History   Not on file  Tobacco Use   Smoking status: Never   Smokeless tobacco: Never  Substance and Sexual Activity   Alcohol use: No   Drug use: No   Sexual activity: Not on file  Other Topics Concern   Not on file  Social History Narrative   Not on file    Social Drivers of Health   Financial Resource Strain: Low Risk  (02/10/2023)   Overall Financial Resource Strain (CARDIA)    Difficulty of Paying Living Expenses: Not very hard  Food Insecurity: No Food Insecurity (02/10/2023)   Hunger Vital Sign    Worried About Running Out of Food in the Last Year: Never true    Ran Out of Food in the Last Year: Never true  Transportation Needs: No Transportation Needs (02/10/2023)   PRAPARE - Administrator, Civil Service (Medical): No    Lack of Transportation (Non-Medical): No  Physical Activity: Unknown (02/10/2023)   Exercise Vital Sign    Days of Exercise per Week: Patient declined    Minutes of Exercise per Session: 30 min  Stress: Stress Concern Present (02/10/2023)   [employer] of Occupational Health - Occupational Stress Questionnaire    Feeling of Stress : To some extent  Social Connections: Socially Integrated (02/10/2023)   Social Connection and Isolation Panel [NHANES]    Frequency of Communication with Friends and Family: More than three times a week    Frequency of Social Gatherings with Friends and Family: Patient declined    Attends Religious Services: More than 4 times per year    Active Member of [employer] or Organizations: Yes    Attends Engineer, structural: More than 4 times per year    Marital Status: Married  Catering manager Violence: Not At Risk (08/21/2022)   Humiliation, Afraid, Rape, and Kick questionnaire    Fear of Current or Ex-Partner: No    Emotionally Abused: No    Physically Abused: No    Sexually Abused: No     Review of Systems  All other systems reviewed and are negative.      Objective:   Physical Exam Vitals reviewed.  Constitutional:      General: She is not in acute distress.    Appearance: She is well-developed. She is not diaphoretic.  HENT:     Head: Normocephalic and atraumatic.     Nose: Nose normal.     Mouth/Throat:     Pharynx: No oropharyngeal exudate.   Eyes:     Conjunctiva/sclera: Conjunctivae normal.  Cardiovascular:     Rate and Rhythm: Normal rate and regular rhythm.     Heart sounds: Normal heart sounds. No murmur heard. Pulmonary:     Effort: Pulmonary effort is normal. No respiratory distress.     Breath sounds: Normal breath sounds.  No wheezing or rales.  Abdominal:     General: Bowel sounds are normal. There is no distension.     Palpations: Abdomen is soft.     Tenderness: There is no abdominal tenderness. There is no guarding or rebound.  Musculoskeletal:     Cervical back: Neck supple.  Lymphadenopathy:     Cervical: No cervical adenopathy.  Neurological:     Mental Status: She is alert and oriented to person, place, and time.     Cranial Nerves: No cranial nerve deficit.     Motor: No abnormal muscle tone.     Coordination: Coordination normal.     Deep Tendon Reflexes: Reflexes are normal and symmetric.  Psychiatric:        Behavior: Behavior normal.        Thought Content: Thought content normal.        Judgment: Judgment normal.           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  I recommended trying Lexapro  10 mg a day and reassessing in 6 weeks.  Blood pressure today is excellent.  I will check her for baseline lab work including a CBC a CMP and lipid panel.

## 2023-08-14 LAB — CBC WITH DIFFERENTIAL/PLATELET
Absolute Lymphocytes: 1018 {cells}/uL (ref 850–3900)
Absolute Monocytes: 300 {cells}/uL (ref 200–950)
Basophils Absolute: 11 {cells}/uL (ref 0–200)
Basophils Relative: 0.3 %
Eosinophils Absolute: 11 {cells}/uL — ABNORMAL LOW (ref 15–500)
Eosinophils Relative: 0.3 %
HCT: 43.6 % (ref 35.0–45.0)
Hemoglobin: 14 g/dL (ref 11.7–15.5)
MCH: 28.5 pg (ref 27.0–33.0)
MCHC: 32.1 g/dL (ref 32.0–36.0)
MCV: 88.6 fL (ref 80.0–100.0)
MPV: 9.6 fL (ref 7.5–12.5)
Monocytes Relative: 7.9 %
Neutro Abs: 2459 {cells}/uL (ref 1500–7800)
Neutrophils Relative %: 64.7 %
Platelets: 260 10*3/uL (ref 140–400)
RBC: 4.92 10*6/uL (ref 3.80–5.10)
RDW: 13.8 % (ref 11.0–15.0)
Total Lymphocyte: 26.8 %
WBC: 3.8 10*3/uL (ref 3.8–10.8)

## 2023-08-14 LAB — COMPLETE METABOLIC PANEL WITHOUT GFR
AG Ratio: 2 (calc) (ref 1.0–2.5)
ALT: 14 U/L (ref 6–29)
AST: 24 U/L (ref 10–35)
Albumin: 4.3 g/dL (ref 3.6–5.1)
Alkaline phosphatase (APISO): 45 U/L (ref 37–153)
BUN: 16 mg/dL (ref 7–25)
CO2: 23 mmol/L (ref 20–32)
Calcium: 9.1 mg/dL (ref 8.6–10.4)
Chloride: 108 mmol/L (ref 98–110)
Creat: 0.87 mg/dL (ref 0.60–1.00)
Globulin: 2.1 g/dL (ref 1.9–3.7)
Glucose, Bld: 98 mg/dL (ref 65–99)
Potassium: 4.3 mmol/L (ref 3.5–5.3)
Sodium: 139 mmol/L (ref 135–146)
Total Bilirubin: 0.7 mg/dL (ref 0.2–1.2)
Total Protein: 6.4 g/dL (ref 6.1–8.1)

## 2023-08-14 LAB — LIPID PANEL
Cholesterol: 94 mg/dL (ref <200)
HDL: 33 mg/dL — ABNORMAL LOW (ref >=50)
LDL Cholesterol (Calc): 43 mg/dL
Non-HDL Cholesterol (Calc): 61 mg/dL (ref <130)
Total CHOL/HDL Ratio: 2.8 (calc) (ref <5.0)
Triglycerides: 98 mg/dL (ref <150)

## 2023-08-17 ENCOUNTER — Encounter: Payer: Self-pay | Admitting: Family Medicine

## 2023-08-17 ENCOUNTER — Other Ambulatory Visit: Payer: Self-pay

## 2023-08-17 DIAGNOSIS — I421 Obstructive hypertrophic cardiomyopathy: Secondary | ICD-10-CM

## 2023-08-17 DIAGNOSIS — I10 Essential (primary) hypertension: Secondary | ICD-10-CM

## 2023-08-17 MED ORDER — VERAPAMIL HCL ER 240 MG PO TBCR: 240.0000 mg | EXTENDED_RELEASE_TABLET | Freq: Every day | ORAL | 2 refills | Status: DC

## 2023-08-18 ENCOUNTER — Encounter: Payer: Self-pay | Admitting: Family Medicine

## 2023-08-27 ENCOUNTER — Encounter: Payer: Self-pay | Admitting: Family Medicine

## 2023-08-27 ENCOUNTER — Encounter (HOSPITAL_COMMUNITY): Payer: Self-pay

## 2023-08-27 ENCOUNTER — Ambulatory Visit: Admitting: Family Medicine

## 2023-08-27 VITALS — BP 158/80 | HR 73 | Temp 97.4°F | Ht 67.0 in | Wt 170.4 lb

## 2023-08-27 DIAGNOSIS — N3001 Acute cystitis with hematuria: Secondary | ICD-10-CM | POA: Diagnosis not present

## 2023-08-27 LAB — URINALYSIS, ROUTINE W REFLEX MICROSCOPIC
Bilirubin Urine: NEGATIVE
Glucose, UA: NEGATIVE
Hyaline Cast: NONE SEEN /LPF
Ketones, ur: NEGATIVE
Nitrite: POSITIVE — AB
Protein, ur: NEGATIVE
Specific Gravity, Urine: 1.01 (ref 1.001–1.035)
pH: 7 (ref 5.0–8.0)

## 2023-08-27 LAB — MICROSCOPIC MESSAGE

## 2023-08-27 MED ORDER — CIPROFLOXACIN HCL 500 MG PO TABS: 500.0000 mg | ORAL_TABLET | Freq: Two times a day (BID) | ORAL | 0 refills | Status: AC

## 2023-08-27 NOTE — Progress Notes (Signed)
 Subjective:  HPI: Veronica Flores is a 78 y.o. female presenting on 08/27/2023 for Urinary Frequency   Urinary Frequency  Associated symptoms include frequency.   Patient is in today for 6 days dysuria and urinary frequency. She reports excruciating pain Saturday with urinating with some blood in her urine. This was similar to a past kidney stone many years ago. She continue to have aching pain in her lower abdomen with dysuria. Hematuria has resolved. She was taking Azo. Denies fever, chills, body aches, flank pain  Review of Systems  Genitourinary:  Positive for frequency.  All other systems reviewed and are negative.   Relevant past medical history reviewed and updated as indicated.   Past Medical History:  Diagnosis Date   Allergy    Asthma    Asthma    Phreesia 02/27/2020   Autoimmune hepatitis (HCC)    Fatigue    Hypertension    Hypertrophic cardiomyopathy (HCC)    Meningioma (HCC)    s/p craniotomy and removal from right medial sphenoid wing extending into cavernous sinus   Migraine, ophthalmoplegic    Osteopenia      Past Surgical History:  Procedure Laterality Date   ANKLE SURGERY     BRAIN SURGERY     CHOLECYSTECTOMY     FRACTURE SURGERY N/A    [facility]reesia 02/27/2020   IR RADIOLOGIST EVAL & MGMT  08/26/2016   IR VERTEBROPLASTY LUMBAR BX INC UNI/BIL INC/INJECT/IMAGING  07/30/2016   WRIST SURGERY      Allergies and medications reviewed and updated.   Current Outpatient Medications:    albuterol  (VENTOLIN  HFA) 108 (90 Base) MCG/ACT inhaler, Inhale 2 puffs into the lungs every 6 (six) hours as needed for wheezing or shortness of breath., Disp: 8 g, Rfl: 0   aspirin EC 81 MG tablet, Take 81 mg by mouth daily. Swallow whole., Disp: , Rfl:    azaTHIOprine (IMURAN) 50 MG tablet, Take 100 mg by mouth daily. 2 pills once a day, Disp: , Rfl:    Calcium  Carbonate-Vitamin D 600-400 MG-UNIT chew tablet, Chew 1 tablet by mouth 2 (two) times daily., Disp: , Rfl:     ciprofloxacin  (CIPRO ) 500 MG tablet, Take 1 tablet (500 mg total) by mouth 2 (two) times daily for 7 days., Disp: 14 tablet, Rfl: 0   escitalopram  (LEXAPRO ) 10 MG tablet, Take 1 tablet (10 mg total) by mouth daily., Disp: 90 tablet, Rfl: 3   fluticasone -salmeterol (ADVAIR) 100-50 MCG/ACT AEPB, USE ONE INHALATION BY MOUTH INTO THE LUNGS 2 TIMES DAILY. GENERIC EQUIVALENT FOR ADVAIR DISKUS, Disp: 180 each, Rfl: 3   ipratropium (ATROVENT ) 0.06 % nasal spray, USE ONE SPRAY NASALLY THREE TIMES DAILY, Disp: 45 mL, Rfl: 3   levocetirizine (XYZAL ) 5 MG tablet, Take 1 tablet (5 mg total) by mouth every evening., Disp: 90 tablet, Rfl: 3   meloxicam  (MOBIC ) 15 MG tablet, Take 1 tablet (15 mg total) by mouth daily., Disp: 90 tablet, Rfl: 1   montelukast  (SINGULAIR ) 10 MG tablet, TAKE 1 TABLET BY MOUTH AT BEDTIME, Disp: 90 tablet, Rfl: 3   rosuvastatin  (CRESTOR ) 20 MG tablet, Take 1 tablet (20 mg total) by mouth daily., Disp: 90 tablet, Rfl: 1   valsartan  (DIOVAN ) 80 MG tablet, Take 1 tablet (80 mg total) by mouth daily., Disp: 90 tablet, Rfl: 0   verapamil  (CALAN -SR) 240 MG CR tablet, Take 1 tablet (240 mg total) by mouth daily., Disp: 90 tablet, Rfl: 2  Allergies  Allergen Reactions   Penicillins Anaphylaxis  Allegra [Fexofenadine Hydrochloride]     palpitations   Desloratadine     Palpitations    Cephalexin Hives and Rash   Molnupiravir  Rash    Objective:   BP (!) 158/80   Pulse 73   Temp (!) 97.4 F (36.3 C) (Oral)   Ht 5\' 7"  (1.702 m)   Wt 170 lb 6 oz (77.3 kg)   SpO2 98%   BMI 26.68 kg/m      08/27/2023   11:04 AM 08/13/2023    8:03 AM 05/05/2023   10:06 AM  Vitals with BMI  Height 5\' 7"  5\' 7"  5\' 7"   Weight 170 lbs 6 oz 171 lbs 6 oz 174 lbs 2 oz  BMI 26.68 26.84 27.27  Systolic 158 120 161  Diastolic 80 64 78  Pulse 73 69 76     Physical Exam Vitals and nursing note reviewed.  Constitutional:      Appearance: Normal appearance. She is normal weight.  HENT:     Head:  Normocephalic and atraumatic.  Abdominal:     General: Bowel sounds are normal. There is no distension.     Palpations: Abdomen is soft.     Tenderness: There is abdominal tenderness. There is no right CVA tenderness or left CVA tenderness.  Skin:    General: Skin is warm and dry.  Neurological:     General: No focal deficit present.     Mental Status: She is alert and oriented to person, place, and time. Mental status is at baseline.  Psychiatric:        Mood and Affect: Mood normal.        Behavior: Behavior normal.        Thought Content: Thought content normal.        Judgment: Judgment normal.     Assessment & Plan:  Acute cystitis with hematuria Assessment & Plan: Urine dipstick shows positive for RBC's, positive for nitrates, and positive for leukocytes.  Micro exam: 6-10 WBC's per HPF, 0-2 RBC's per HPF, and few+ bacteria. Sent for culture Start Cipro  500mg  BID x7d . Return to office if symptoms persist or worsen.   Orders: -     Urine Culture  Other orders -     Ciprofloxacin  HCl; Take 1 tablet (500 mg total) by mouth 2 (two) times daily for 7 days.  Dispense: 14 tablet; Refill: 0     Follow up plan: Return if symptoms worsen or fail to improve.  Jenelle Mis, FNP

## 2023-08-27 NOTE — Addendum Note (Signed)
 Addended by: Anner Bars on: 08/27/2023 11:57 AM   Modules accepted: Orders

## 2023-08-27 NOTE — Assessment & Plan Note (Addendum)
 Urine dipstick shows positive for RBC's, positive for nitrates, and positive for leukocytes.  Micro exam: 6-10 WBC's per HPF, 0-2 RBC's per HPF, and few+ bacteria. Sent for culture Start Cipro  500mg  BID x7d . Return to office if symptoms persist or worsen.

## 2023-08-30 LAB — URINE CULTURE
MICRO NUMBER:: 16430564
SPECIMEN QUALITY:: ADEQUATE

## 2023-09-03 ENCOUNTER — Encounter: Payer: Self-pay | Admitting: Family Medicine

## 2023-09-03 ENCOUNTER — Ambulatory Visit: Payer: Medicare Other | Admitting: *Deleted

## 2023-09-03 DIAGNOSIS — Z0000 Encounter for general adult medical examination without abnormal findings: Secondary | ICD-10-CM | POA: Diagnosis not present

## 2023-09-03 DIAGNOSIS — Z780 Asymptomatic menopausal state: Secondary | ICD-10-CM

## 2023-09-03 NOTE — Patient Instructions (Signed)
 [name] , Thank you for taking time to come for your Medicare Wellness Visit. I appreciate your ongoing commitment to your health goals. Please review the following plan we discussed and let me know if I can assist you in the future.   Screening recommendations/referrals: Colonoscopy: no longer required Mammogram: up to date Bone Density: ordered to [facility] Recommended yearly ophthalmology/optometry visit for glaucoma screening and checkup Recommended yearly dental visit for hygiene and checkup  Vaccinations: Influenza vaccine: up to date Pneumococcal vaccine: up to date Tdap vaccine:  Shingles vaccine:          Preventive Care 65 Years and Older, Female Preventive care refers to lifestyle choices and visits with your health care provider that can promote health and wellness. What does preventive care include? A yearly physical exam. This is also called an annual well check. Dental exams once or twice a year. Routine eye exams. Ask your health care provider how often you should have your eyes checked. Personal lifestyle choices, including: Daily care of your teeth and gums. Regular physical activity. Eating a healthy diet. Avoiding tobacco and drug use. Limiting alcohol use. Practicing safe sex. Taking low-dose aspirin every day. Taking vitamin and mineral supplements as recommended by your health care provider. What happens during an annual well check? The services and screenings done by your health care provider during your annual well check will depend on your age, overall health, lifestyle risk factors, and family history of disease. Counseling  Your health care provider may ask you questions about your: Alcohol use. Tobacco use. Drug use. Emotional well-being. Home and relationship well-being. Sexual activity. Eating habits. History of falls. Memory and ability to understand (cognition). Work and work Astronomer. Reproductive health. Screening  You may have  the following tests or measurements: Height, weight, and BMI. Blood pressure. Lipid and cholesterol levels. These may be checked every 5 years, or more frequently if you are over 82 years old. Skin check. Lung cancer screening. You may have this screening every year starting at age 64 if you have a 30-pack-year history of smoking and currently smoke or have quit within the past 15 years. Fecal occult blood test (FOBT) of the stool. You may have this test every year starting at age 9. Flexible sigmoidoscopy or colonoscopy. You may have a sigmoidoscopy every 5 years or a colonoscopy every 10 years starting at age 68. Hepatitis C blood test. Hepatitis B blood test. Sexually transmitted disease (STD) testing. Diabetes screening. This is done by checking your blood sugar (glucose) after you have not eaten for a while (fasting). You may have this done every 1-3 years. Bone density scan. This is done to screen for osteoporosis. You may have this done starting at age 106. Mammogram. This may be done every 1-2 years. Talk to your health care provider about how often you should have regular mammograms. Talk with your health care provider about your test results, treatment options, and if necessary, the need for more tests. Vaccines  Your health care provider may recommend certain vaccines, such as: Influenza vaccine. This is recommended every year. Tetanus, diphtheria, and acellular pertussis (Tdap, Td) vaccine. You may need a Td booster every 10 years. Zoster vaccine. You may need this after age 29. Pneumococcal 13-valent conjugate (PCV13) vaccine. One dose is recommended after age 42. Pneumococcal polysaccharide (PPSV23) vaccine. One dose is recommended after age 75. Talk to your health care provider about which screenings and vaccines you need and how often you need them. This information  is not intended to replace advice given to you by your health care provider. Make sure you discuss any questions  you have with your health care provider. Document Released: 05/04/2015 Document Revised: 12/26/2015 Document Reviewed: 02/06/2015 Elsevier Interactive Patient Education  2017 [employer].  Fall Prevention in the Home Falls can cause injuries. They can happen to people of all ages. There are many things you can do to make your home safe and to help prevent falls. What can I do on the outside of my home? Regularly fix the edges of walkways and driveways and fix any cracks. Remove anything that might make you trip as you walk through a door, such as a raised step or threshold. Trim any bushes or trees on the path to your home. Use bright outdoor lighting. Clear any walking paths of anything that might make someone trip, such as rocks or tools. Regularly check to see if handrails are loose or broken. Make sure that both sides of any steps have handrails. Any raised decks and porches should have guardrails on the edges. Have any leaves, snow, or ice cleared regularly. Use sand or salt on walking paths during winter. Clean up any spills in your garage right away. This includes oil or grease spills. What can I do in the bathroom? Use night lights. Install grab bars by the toilet and in the tub and shower. Do not use towel bars as grab bars. Use non-skid mats or decals in the tub or shower. If you need to sit down in the shower, use a plastic, non-slip stool. Keep the floor dry. Clean up any water that spills on the floor as soon as it happens. Remove soap buildup in the tub or shower regularly. Attach bath mats securely with double-sided non-slip rug tape. Do not have throw rugs and other things on the floor that can make you trip. What can I do in the bedroom? Use night lights. Make sure that you have a light by your bed that is easy to reach. Do not use any sheets or blankets that are too big for your bed. They should not hang down onto the floor. Have a firm chair that has side arms. You  can use this for support while you get dressed. Do not have throw rugs and other things on the floor that can make you trip. What can I do in the kitchen? Clean up any spills right away. Avoid walking on wet floors. Keep items that you use a lot in easy-to-reach places. If you need to reach something above you, use a strong step stool that has a grab bar. Keep electrical cords out of the way. Do not use floor polish or wax that makes floors slippery. If you must use wax, use non-skid floor wax. Do not have throw rugs and other things on the floor that can make you trip. What can I do with my stairs? Do not leave any items on the stairs. Make sure that there are handrails on both sides of the stairs and use them. Fix handrails that are broken or loose. Make sure that handrails are as long as the stairways. Check any carpeting to make sure that it is firmly attached to the stairs. Fix any carpet that is loose or worn. Avoid having throw rugs at the top or bottom of the stairs. If you do have throw rugs, attach them to the floor with carpet tape. Make sure that you have a light switch at the top of  the stairs and the bottom of the stairs. If you do not have them, ask someone to add them for you. What else can I do to help prevent falls? Wear shoes that: Do not have high heels. Have rubber bottoms. Are comfortable and fit you well. Are closed at the toe. Do not wear sandals. If you use a stepladder: Make sure that it is fully opened. Do not climb a closed stepladder. Make sure that both sides of the stepladder are locked into place. Ask someone to hold it for you, if possible. Clearly mark and make sure that you can see: Any grab bars or handrails. First and last steps. Where the edge of each step is. Use tools that help you move around (mobility aids) if they are needed. These include: Canes. Walkers. Scooters. Crutches. Turn on the lights when you go into a dark area. Replace any  light bulbs as soon as they burn out. Set up your furniture so you have a clear path. Avoid moving your furniture around. If any of your floors are uneven, fix them. If there are any pets around you, be aware of where they are. Review your medicines with your doctor. Some medicines can make you feel dizzy. This can increase your chance of falling. Ask your doctor what other things that you can do to help prevent falls. This information is not intended to replace advice given to you by your health care provider. Make sure you discuss any questions you have with your health care provider. Document Released: 02/01/2009 Document Revised: 09/13/2015 Document Reviewed: 05/12/2014 Elsevier Interactive Patient Education  2017 [employer].

## 2023-09-03 NOTE — Progress Notes (Signed)
 Subjective:   Veronica Flores is a 78 y.o. female who presents for Medicare Annual (Subsequent) preventive examination.  Visit Complete: Virtual I connected with  Nils Baseman on 09/03/23 by a audio enabled telemedicine application and verified that I am speaking with the correct person using two identifiers.  Patient Location: Home  Provider Location: Home Office  I discussed the limitations of evaluation and management by telemedicine. The patient expressed understanding and agreed to proceed.  Vital Signs: Because this visit was a virtual/telehealth visit, some criteria may be missing or patient reported. Any vitals not documented were not able to be obtained and vitals that have been documented are patient reported.   Cardiac Risk Factors include: advanced age (>35men, >84 women)     Objective:     There were no vitals filed for this visit. There is no height or weight on file to calculate BMI.     09/03/2023    9:04 AM 08/21/2022   10:00 AM 08/15/2021    9:06 AM 08/09/2020   11:44 AM 07/30/2016   10:35 AM  Advanced Directives  Does Patient Have a Medical Advance Directive? Yes Yes Yes Yes Yes  Type of Advance Directive Healthcare Power of Attorney Living will;Healthcare Power of [employer] Power of [place];Living will  Living will  Does patient want to make changes to medical advance directive?  No - Patient declined   No - Patient declined  Copy of Healthcare Power of Attorney in Chart? No - copy requested No - copy requested No - copy requested      Current Medications (verified) Outpatient Encounter Medications as of 09/03/2023  Medication Sig   albuterol  (VENTOLIN  HFA) 108 (90 Base) MCG/ACT inhaler Inhale 2 puffs into the lungs every 6 (six) hours as needed for wheezing or shortness of breath.   aspirin EC 81 MG tablet Take 81 mg by mouth daily. Swallow whole.   azaTHIOprine (IMURAN) 50 MG tablet Take 100 mg by mouth daily. 2 pills once a day   Calcium   Carbonate-Vitamin D 600-400 MG-UNIT chew tablet Chew 1 tablet by mouth 2 (two) times daily.   escitalopram  (LEXAPRO ) 10 MG tablet Take 1 tablet (10 mg total) by mouth daily.   fluticasone -salmeterol (ADVAIR) 100-50 MCG/ACT AEPB USE ONE INHALATION BY MOUTH INTO THE LUNGS 2 TIMES DAILY. GENERIC EQUIVALENT FOR ADVAIR DISKUS   ipratropium (ATROVENT ) 0.06 % nasal spray USE ONE SPRAY NASALLY THREE TIMES DAILY   levocetirizine (XYZAL ) 5 MG tablet Take 1 tablet (5 mg total) by mouth every evening.   meloxicam  (MOBIC ) 15 MG tablet Take 1 tablet (15 mg total) by mouth daily.   montelukast  (SINGULAIR ) 10 MG tablet TAKE 1 TABLET BY MOUTH AT BEDTIME   rosuvastatin  (CRESTOR ) 20 MG tablet Take 1 tablet (20 mg total) by mouth daily.   valsartan  (DIOVAN ) 80 MG tablet Take 1 tablet (80 mg total) by mouth daily.   verapamil  (CALAN -SR) 240 MG CR tablet Take 1 tablet (240 mg total) by mouth daily.   ciprofloxacin  (CIPRO ) 500 MG tablet Take 1 tablet (500 mg total) by mouth 2 (two) times daily for 7 days.   No facility-administered encounter medications on file as of 09/03/2023.    Allergies (verified) Penicillins, Allegra [fexofenadine hydrochloride], Desloratadine, Cephalexin, and Molnupiravir    History: Past Medical History:  Diagnosis Date   Allergy    Asthma    Asthma    [employer]rees[employer] 02/27/2020   Autoimmune hepatitis (HCC)    Fatigue    Hypertension  Hypertrophic cardiomyopathy (HCC)    Meningioma (HCC)    s/p craniotomy and removal from right medial sphenoid wing extending into cavernous sinus   Migraine, ophthalmoplegic    Osteopenia    Past Surgical History:  Procedure Laterality Date   ANKLE SURGERY     BRAIN SURGERY     CHOLECYSTECTOMY     FRACTURE SURGERY N/A    [employer]reesia 02/27/2020   IR RADIOLOGIST EVAL & MGMT  08/26/2016   IR VERTEBROPLASTY LUMBAR BX INC UNI/BIL INC/INJECT/IMAGING  07/30/2016   WRIST SURGERY     History reviewed. No pertinent family history. Social History    Socioeconomic History   Marital status: Married    Spouse name: Not on file   Number of children: Not on file   Years of education: Not on file   Highest education level: 12th grade  Occupational History   Not on file  Tobacco Use   Smoking status: Never   Smokeless tobacco: Never  Substance and Sexual Activity   Alcohol use: No   Drug use: No   Sexual activity: Not Currently  Other Topics Concern   Not on file  Social History Narrative   Not on file   Social Drivers of Health   Financial Resource Strain: Low Risk  (09/03/2023)   Overall Financial Resource Strain (CARDIA)    Difficulty of Paying Living Expenses: Not hard at all  Food Insecurity: No Food Insecurity (09/03/2023)   Hunger Vital Sign    Worried About Running Out of Food in the Last Year: Never true    Ran Out of Food in the Last Year: Never true  Transportation Needs: No Transportation Needs (09/03/2023)   PRAPARE - Administrator, Civil Service (Medical): No    Lack of Transportation (Non-Medical): No  Physical Activity: Inactive (09/03/2023)   Exercise Vital Sign    Days of Exercise per Week: 0 days    Minutes of Exercise per Session: 0 min  Stress: Stress Concern Present (09/03/2023)   [employer] of Occupational Health - Occupational Stress Questionnaire    Feeling of Stress : Very much  Social Connections: Moderately Integrated (09/03/2023)   Social Connection and Isolation Panel [NHANES]    Frequency of Communication with Friends and Family: More than three times a week    Frequency of Social Gatherings with Friends and Family: Three times a week    Attends Religious Services: More than 4 times per year    Active Member of Clubs or Organizations: No    Attends Banker Meetings: Never    Marital Status: Married    Tobacco Counseling Counseling given: Not Answered   Clinical Intake:  Pre-visit preparation completed: Yes  Pain : No/denies pain     Diabetes:  No  How often do you need to have someone help you when you read instructions, pamphlets, or other written materials from your doctor or pharmacy?: 1 - Never  Interpreter Needed?: No  Information entered by :: Kieth Pelt LPN   Activities of Daily Living    09/03/2023    9:06 AM  In your present state of health, do you have any difficulty performing the following activities:  Hearing? 0  Vision? 0  Difficulty concentrating or making decisions? 1  Walking or climbing stairs? 0  Dressing or bathing? 0  Doing errands, shopping? 0  Preparing Food and eating ? N  Using the Toilet? N  In the past six months, have you accidently leaked urine? N  Do you have problems with loss of bowel control? N  Managing your Medications? N  Managing your Finances? N  Housekeeping or managing your Housekeeping? N    Patient Care Team: Austine Lefort, MD as PCP - General (Family Medicine) Myrle Aspen, [facility] (Inactive) as Pharmacist (Pharmacist)  Indicate any recent Medical Services you may have received from other than [facility] providers in the past year (date may be approximate).     Assessment:    This is a routine wellness examination for [place].  Hearing/Vision screen Hearing Screening - Comments:: No trouble hearing Vision Screening - Comments:: Up to date [place]   Goals Addressed             This Visit's Progress    Patient Stated       Continue current lifestyle       Depression Screen    09/03/2023    9:10 AM 02/12/2023    7:54 AM 08/21/2022   10:01 AM 08/12/2022    8:05 AM 02/10/2022    7:59 AM 08/15/2021    9:02 AM 08/09/2020    1:27 PM  PHQ 2/9 Scores  PHQ - 2 Score 6 0 0 0 0 0 0  PHQ- 9 Score 13          Fall Risk    09/03/2023    9:03 AM 02/12/2023    7:54 AM 08/21/2022    9:59 AM 08/12/2022    8:05 AM 02/10/2022    7:59 AM  Fall Risk   Falls in the past year? 0 0 0 0 0  Number falls in past yr: 0 0 0 0 0  Injury with Fall? 0 0 0 0 0  Risk for fall due  to :  No Fall Risks No Fall Risks No Fall Risks No Fall Risks  Follow up Falls evaluation completed;Education provided;Falls prevention discussed Falls prevention discussed Falls prevention discussed;Education provided;Falls evaluation completed Falls prevention discussed Falls prevention discussed    MEDICARE RISK AT HOME: Medicare Risk at Home Any stairs in or around the home?: Yes If so, are there any without handrails?: No Home free of loose throw rugs in walkways, pet beds, electrical cords, etc?: Yes Adequate lighting in your home to reduce risk of falls?: Yes Life alert?: No Use of a cane, walker or w/c?: No Grab bars in the bathroom?: Yes Shower chair or bench in shower?: Yes Elevated toilet seat or a handicapped toilet?: Yes  TIMED UP AND GO:  Was the test performed?  No    Cognitive Function:        09/03/2023    9:06 AM 08/21/2022    9:59 AM 08/15/2021    9:10 AM 08/09/2020   11:42 AM  6CIT Screen  What Year? 0 points 0 points 0 points 0 points  What month? 0 points 0 points 0 points 0 points  What time? 0 points 0 points 0 points 0 points  Count back from 20 0 points 0 points 0 points 0 points  Months in reverse 0 points 0 points 0 points 0 points  Repeat phrase 0 points 0 points 0 points 0 points  Total Score 0 points 0 points 0 points 0 points    Immunizations Immunization History  Administered Date(s) Administered   Fluad Quad(high Dose 65+) 01/11/2019, 02/06/2020, 01/10/2021   Fluad Trivalent(High Dose 65+) 01/20/2023   Hepatitis A 12/19/2003, 10/13/2006   Hepatitis B 12/19/2003, 10/13/2006   Influenza Whole 01/02/2010   Influenza, High Dose  Seasonal PF 02/05/2017, 01/14/2018, 01/20/2022   Influenza,inj,Quad PF,6+ Mos 01/27/2013, 02/09/2014, 02/08/2015, 02/07/2016   PFIZER(Purple Top)SARS-COV-2 Vaccination 05/28/2019, 06/21/2019, 01/03/2020   Pneumococcal Conjugate-13 05/27/2013   Pneumococcal Polysaccharide-23 01/20/2004, 09/02/2010, 01/14/2018   Td  04/22/2003   Tdap 03/18/2011    TDAP status: Due, Education has been provided regarding the importance of this vaccine. Advised may receive this vaccine at local pharmacy or Health Dept. Aware to provide a copy of the vaccination record if obtained from local pharmacy or Health Dept. Verbalized acceptance and understanding.  Flu Vaccine status: Up to date  Pneumococcal vaccine status: Up to date  Covid-19 vaccine status: Information provided on how to obtain vaccines.   Qualifies for Shingles Vaccine? No   Zostavax completed md advised not to receive per patient  Shingrix Completed?: No.    Education has been provided regarding the importance of this vaccine. Patient has been advised to call insurance company to determine out of pocket expense if they have not yet received this vaccine. Advised may also receive vaccine at local pharmacy or Health Dept. Verbalized acceptance and understanding.  Screening Tests Health Maintenance  Topic Date Due   Hepatitis C Screening  Never done   COVID-19 Vaccine (4 - 2024-25 season) 12/21/2022   INFLUENZA VACCINE  11/20/2023   Medicare Annual Wellness (AWV)  09/02/2024   Pneumonia Vaccine 75+ Years old  Completed   DEXA SCAN  Completed   HPV VACCINES  Aged Out   Meningococcal B Vaccine  Aged Out   DTaP/Tdap/Td  Discontinued   Colonoscopy  Discontinued   Zoster Vaccines- Shingrix  Discontinued    Health Maintenance  Health Maintenance Due  Topic Date Due   Hepatitis C Screening  Never done   COVID-19 Vaccine (4 - 2024-25 season) 12/21/2022    Colorectal cancer screening: No longer required.   Mammogram status: Completed  . Repeat every year  Bone Density status: Ordered  . Pt provided with contact info and advised to call to schedule appt.  Lung Cancer Screening: (Low Dose CT Chest recommended if Age 77-80 years, 20 pack-year currently smoking OR have quit w/in 15years.) does not qualify.   Lung Cancer Screening Referral:    Additional Screening:  Hepatitis C Screening: does qualify;   declined  Vision Screening: Recommended annual ophthalmology exams for early detection of glaucoma and other disorders of the eye. Is the patient up to date with their annual eye exam?  Yes  Who is the provider or what is the name of the office in which the patient attends annual eye exams? [facility] If pt is not established with a provider, would they like to be referred to a provider to establish care? No .   Dental Screening: Recommended annual dental exams for proper oral hygiene    Community Resource Referral / Chronic Care Management: CRR required this visit?  No   CCM required this visit?  No     Plan:     I have personally reviewed and noted the following in the patient's chart:   Medical and social history Use of alcohol, tobacco or illicit drugs  Current medications and supplements including opioid prescriptions. Patient is not currently taking opioid prescriptions. Functional ability and status Nutritional status Physical activity Advanced directives List of other physicians Hospitalizations, surgeries, and ER visits in previous 12 months Vitals Screenings to include cognitive, depression, and falls Referrals and appointments  In addition, I have reviewed and discussed with patient certain preventive protocols, quality metrics, and best practice  recommendations. A written personalized care plan for preventive services as well as general preventive health recommendations were provided to patient.     Kieth Pelt, LPN   07/28/8117   After Visit Summary: (MyChart) Due to this being a telephonic visit, the after visit summary with patients personalized plan was offered to patient via MyChart   Nurse Notes:

## 2023-10-01 ENCOUNTER — Other Ambulatory Visit: Payer: Self-pay

## 2023-10-01 ENCOUNTER — Encounter: Payer: Self-pay | Admitting: Family Medicine

## 2023-10-01 DIAGNOSIS — I421 Obstructive hypertrophic cardiomyopathy: Secondary | ICD-10-CM

## 2023-10-01 DIAGNOSIS — I10 Essential (primary) hypertension: Secondary | ICD-10-CM

## 2023-10-01 MED ORDER — VALSARTAN 80 MG PO TABS: 80.0000 mg | ORAL_TABLET | Freq: Every day | ORAL | 0 refills | Status: DC

## 2023-11-20 ENCOUNTER — Other Ambulatory Visit: Payer: Self-pay | Admitting: Family Medicine

## 2023-11-20 NOTE — Telephone Encounter (Signed)
 Prescription Request  11/20/2023  LOV: 08/13/2023  What is the name of the medication or equipment?   ipratropium (ATROVENT ) 0.06 % nasal spray  **new script needed**  Have you contacted your pharmacy to request a refill? Yes   Which pharmacy would you like this sent to?  [facility] - [place], [place] - [address] [place] [id number] Phone: [phone] Fax: [phone]    Patient notified that their request is being sent to the clinical staff for review and that they should receive a response within 2 business days.   Please advise at pharmacist.

## 2023-11-23 MED ORDER — IPRATROPIUM BROMIDE 0.06 % NA SOLN: NASAL | 3 refills | Status: DC

## 2023-11-23 NOTE — Telephone Encounter (Signed)
 Requested medication (s) are due for refill today: yes  Requested medication (s) are on the active medication list: yes  Last refill:  08/22/22 45 ml 3 RF  Future visit scheduled: yes  Notes to clinic:   med not assigned to a protocol   Requested Prescriptions  Pending Prescriptions Disp Refills   ipratropium (ATROVENT ) 0.06 % nasal spray 45 mL 3    Sig: USE ONE SPRAY NASALLY THREE TIMES DAILY     Off-Protocol Failed - 11/23/2023  7:52 AM      Failed - Medication not assigned to a protocol, review manually.      Passed - Valid encounter within last 12 months    Recent Outpatient Visits           2 months ago Acute cystitis with hematuria   [facility] [facility] Kayla Jeoffrey RAMAN, FNP   3 months ago Pure hypercholesterolemia   [place] [facility] Duanne, Butler DASEN, MD   6 months ago Upper respiratory tract infection, unspecified type   [place] [facility] Duanne Butler DASEN, MD   9 months ago Autoimmune hepatitis [facility])   [place] [facility] Pickard, Butler DASEN, MD   1 year ago Pure hypercholesterolemia   [place] [facility] Duanne Butler DASEN, MD             Off-Protocol Failed - 11/23/2023  7:52 AM      Failed - Medication not assigned to a protocol, review manually.      Passed - Valid encounter within last 12 months    Recent Outpatient Visits           2 months ago Acute cystitis with hematuria   [facility] Kayla Jeoffrey RAMAN, FNP   3 months ago Pure hypercholesterolemia   [place] [facility] Duanne, Butler DASEN, MD   6 months ago Upper respiratory tract infection, unspecified type   [place] [facility] Duanne Butler DASEN, MD   9 months ago Autoimmune hepatitis [facility])   [facility] [facility] Pickard, Butler DASEN, MD   1 year ago Pure hypercholesterolemia    [facility]  Medicine Pickard, Butler DASEN, MD

## 2023-11-25 ENCOUNTER — Other Ambulatory Visit: Payer: Self-pay

## 2023-11-25 NOTE — Progress Notes (Signed)
 Ipratropium nasal spray refill sent in on 11/23/23 by Dr. Duanne.

## 2023-12-29 ENCOUNTER — Other Ambulatory Visit: Payer: Self-pay | Admitting: Family Medicine

## 2023-12-29 DIAGNOSIS — E7800 Pure hypercholesterolemia, unspecified: Secondary | ICD-10-CM

## 2024-01-12 ENCOUNTER — Other Ambulatory Visit: Payer: Self-pay

## 2024-01-12 ENCOUNTER — Encounter: Payer: Self-pay | Admitting: Family Medicine

## 2024-01-12 DIAGNOSIS — I421 Obstructive hypertrophic cardiomyopathy: Secondary | ICD-10-CM

## 2024-01-12 DIAGNOSIS — I10 Essential (primary) hypertension: Secondary | ICD-10-CM

## 2024-01-12 MED ORDER — VALSARTAN 80 MG PO TABS: 80.0000 mg | ORAL_TABLET | Freq: Every day | ORAL | 0 refills | Status: DC

## 2024-01-19 DIAGNOSIS — H2513 Age-related nuclear cataract, bilateral: Secondary | ICD-10-CM | POA: Diagnosis not present

## 2024-01-19 DIAGNOSIS — H04563 Stenosis of bilateral lacrimal punctum: Secondary | ICD-10-CM | POA: Diagnosis not present

## 2024-02-02 DIAGNOSIS — Z1231 Encounter for screening mammogram for malignant neoplasm of breast: Secondary | ICD-10-CM | POA: Diagnosis not present

## 2024-02-02 LAB — HM MAMMOGRAPHY

## 2024-02-03 ENCOUNTER — Encounter: Payer: Self-pay | Admitting: Family Medicine

## 2024-02-15 ENCOUNTER — Other Ambulatory Visit: Payer: Self-pay | Admitting: Family Medicine

## 2024-02-15 ENCOUNTER — Ambulatory Visit: Admitting: Family Medicine

## 2024-02-15 ENCOUNTER — Encounter: Payer: Self-pay | Admitting: Family Medicine

## 2024-02-15 VITALS — BP 122/70 | HR 68 | Temp 98.5°F | Ht 67.0 in | Wt 168.2 lb

## 2024-02-15 DIAGNOSIS — I421 Obstructive hypertrophic cardiomyopathy: Secondary | ICD-10-CM

## 2024-02-15 DIAGNOSIS — J4520 Mild intermittent asthma, uncomplicated: Secondary | ICD-10-CM

## 2024-02-15 DIAGNOSIS — I10 Essential (primary) hypertension: Secondary | ICD-10-CM

## 2024-02-15 DIAGNOSIS — Z23 Encounter for immunization: Secondary | ICD-10-CM | POA: Diagnosis not present

## 2024-02-15 DIAGNOSIS — E7800 Pure hypercholesterolemia, unspecified: Secondary | ICD-10-CM

## 2024-02-15 DIAGNOSIS — K754 Autoimmune hepatitis: Secondary | ICD-10-CM | POA: Diagnosis not present

## 2024-02-15 DIAGNOSIS — R5383 Other fatigue: Secondary | ICD-10-CM

## 2024-02-15 DIAGNOSIS — Z0000 Encounter for general adult medical examination without abnormal findings: Secondary | ICD-10-CM

## 2024-02-15 LAB — CBC WITH DIFFERENTIAL/PLATELET
Absolute Lymphocytes: 1166 {cells}/uL (ref 850–3900)
Absolute Monocytes: 360 {cells}/uL (ref 200–950)
Basophils Absolute: 10 {cells}/uL (ref 0–200)
Basophils Relative: 0.2 %
Eosinophils Absolute: 10 {cells}/uL — ABNORMAL LOW (ref 15–500)
Eosinophils Relative: 0.2 %
HCT: 45.1 % — ABNORMAL HIGH (ref 35.0–45.0)
Hemoglobin: 15.2 g/dL (ref 11.7–15.5)
MCH: 30.5 pg (ref 27.0–33.0)
MCHC: 33.7 g/dL (ref 32.0–36.0)
MCV: 90.4 fL (ref 80.0–100.0)
MPV: 9.6 fL (ref 7.5–12.5)
Monocytes Relative: 7.5 %
Neutro Abs: 3254 {cells}/uL (ref 1500–7800)
Neutrophils Relative %: 67.8 %
Platelets: 284 Thousand/uL (ref 140–400)
RBC: 4.99 Million/uL (ref 3.80–5.10)
RDW: 13.6 % (ref 11.0–15.0)
Total Lymphocyte: 24.3 %
WBC: 4.8 Thousand/uL (ref 3.8–10.8)

## 2024-02-15 LAB — COMPREHENSIVE METABOLIC PANEL WITH GFR
AG Ratio: 1.8 (calc) (ref 1.0–2.5)
ALT: 14 U/L (ref 6–29)
AST: 25 U/L (ref 10–35)
Albumin: 4.4 g/dL (ref 3.6–5.1)
Alkaline phosphatase (APISO): 46 U/L (ref 37–153)
BUN: 17 mg/dL (ref 7–25)
CO2: 22 mmol/L (ref 20–32)
Calcium: 9.3 mg/dL (ref 8.6–10.4)
Chloride: 106 mmol/L (ref 98–110)
Creat: 0.9 mg/dL (ref 0.60–1.00)
Globulin: 2.4 g/dL (ref 1.9–3.7)
Glucose, Bld: 96 mg/dL (ref 65–99)
Potassium: 3.9 mmol/L (ref 3.5–5.3)
Sodium: 137 mmol/L (ref 135–146)
Total Bilirubin: 0.6 mg/dL (ref 0.2–1.2)
Total Protein: 6.8 g/dL (ref 6.1–8.1)
eGFR: 66 mL/min/1.73m2 (ref >=60)

## 2024-02-15 LAB — LIPID PANEL
Cholesterol: 93 mg/dL (ref <200)
HDL: 36 mg/dL — ABNORMAL LOW (ref >=50)
LDL Cholesterol (Calc): 38 mg/dL
Non-HDL Cholesterol (Calc): 57 mg/dL (ref <130)
Total CHOL/HDL Ratio: 2.6 (calc) (ref <5.0)
Triglycerides: 102 mg/dL (ref <150)

## 2024-02-15 LAB — TSH: TSH: 1.91 m[IU]/L (ref 0.40–4.50)

## 2024-02-15 MED ORDER — ALBUTEROL SULFATE HFA 108 (90 BASE) MCG/ACT IN AERS: 2.0000 | INHALATION_SPRAY | Freq: Four times a day (QID) | RESPIRATORY_TRACT | 3 refills | Status: AC | PRN

## 2024-02-15 NOTE — Addendum Note (Signed)
 Addended by: DUANNE LOWERS T on: 02/15/2024 04:54 PM   Modules accepted: Level of Service

## 2024-02-15 NOTE — Progress Notes (Signed)
 Subjective:    Patient ID: Veronica Flores, female    DOB: 04-10-1946, 78 y.o.   MRN: [id number]  Patient is a very pleasant 78 year old Caucasian female here today for a checkup.  Since her last appointment, her husband passed away due to complications from Parkinson's disease.  Obviously she is grieving and adjusting to life without him.  Thankfully she is sleeping better at night.  She denies any depression.  She denies any falls.  She is due for a flu shot.  She is due for shingles vaccine.  The remainder of her vaccines are up-to-date.  She had a mammogram in October which was normal.  Her last bone density test was in 2023.  This is due again next year.  She had mild osteopenia but she is taking calcium  and vitamin D.  Her last colonoscopy was in 2021.  Showed a polyp.  I recommended a colonoscopy in 2026.  She has an appointment to see her gastroenterologist later this year.  She is interested in potentially stopping her Advair.  She states that she has not had to use her rescue inhaler in years. Past Medical History:  Diagnosis Date   Allergy    Asthma    Asthma    [employer] 02/27/2020   Autoimmune hepatitis (HCC)    Fatigue    Hypertension    Hypertrophic cardiomyopathy (HCC)    Meningioma (HCC)    s/p craniotomy and removal from right medial sphenoid wing extending into cavernous sinus   Migraine, ophthalmoplegic    Osteopenia    Past Surgical History:  Procedure Laterality Date   ANKLE SURGERY     BRAIN SURGERY     CHOLECYSTECTOMY     FRACTURE SURGERY N/A    [employer] 02/27/2020   IR RADIOLOGIST EVAL & MGMT  08/26/2016   IR VERTEBROPLASTY LUMBAR BX INC UNI/BIL INC/INJECT/IMAGING  07/30/2016   WRIST SURGERY     Current Outpatient Medications on File Prior to Visit  Medication Sig Dispense Refill   albuterol  (VENTOLIN  HFA) 108 (90 Base) MCG/ACT inhaler Inhale 2 puffs into the lungs every 6 (six) hours as needed for wheezing or shortness of breath. 8 g 0   aspirin EC 81 MG  tablet Take 81 mg by mouth daily. Swallow whole.     azaTHIOprine (IMURAN) 50 MG tablet Take 100 mg by mouth daily. 2 pills once a day     Calcium  Carbonate-Vitamin D 600-400 MG-UNIT chew tablet Chew 1 tablet by mouth 2 (two) times daily.     escitalopram  (LEXAPRO ) 10 MG tablet Take 1 tablet (10 mg total) by mouth daily. 90 tablet 3   fluticasone -salmeterol (ADVAIR) 100-50 MCG/ACT AEPB USE ONE INHALATION BY MOUTH INTO THE LUNGS 2 TIMES DAILY. GENERIC EQUIVALENT FOR ADVAIR DISKUS 180 each 3   ipratropium (ATROVENT ) 0.06 % nasal spray USE ONE SPRAY NASALLY THREE TIMES DAILY 45 mL 3   levocetirizine (XYZAL ) 5 MG tablet Take 1 tablet (5 mg total) by mouth every evening. 90 tablet 3   meloxicam  (MOBIC ) 15 MG tablet Take 1 tablet (15 mg total) by mouth daily. 90 tablet 1   montelukast  (SINGULAIR ) 10 MG tablet TAKE 1 TABLET BY MOUTH AT BEDTIME 90 tablet 3   rosuvastatin  (CRESTOR ) 20 MG tablet TAKE 1 TABLET BY MOUTH DAILY 90 tablet 1   valsartan  (DIOVAN ) 80 MG tablet Take 1 tablet (80 mg total) by mouth daily. 90 tablet 0   verapamil  (CALAN -SR) 240 MG CR tablet Take 1 tablet (240 mg  total) by mouth daily. 90 tablet 2   No current facility-administered medications on file prior to visit.   Allergies  Allergen Reactions   Penicillins Anaphylaxis   Allegra [Fexofenadine Hydrochloride]     palpitations   Desloratadine     Palpitations    Cephalexin Hives and Rash   Molnupiravir  Rash   Social History   Socioeconomic History   Marital status: Married    Spouse name: Not on file   Number of children: Not on file   Years of education: Not on file   Highest education level: 12th grade  Occupational History   Not on file  Tobacco Use   Smoking status: Never   Smokeless tobacco: Never  Substance and Sexual Activity   Alcohol use: No   Drug use: No   Sexual activity: Not Currently  Other Topics Concern   Not on file  Social History Narrative   Not on file   Social Drivers of Health    Financial Resource Strain: Low Risk  (02/11/2024)   Overall Financial Resource Strain (CARDIA)    Difficulty of Paying Living Expenses: Not very hard  Food Insecurity: No Food Insecurity (02/11/2024)   Hunger Vital Sign    Worried About Running Out of Food in the Last Year: Never true    Ran Out of Food in the Last Year: Never true  Transportation Needs: No Transportation Needs (02/11/2024)   PRAPARE - Administrator, Civil Service (Medical): No    Lack of Transportation (Non-Medical): No  Physical Activity: Unknown (02/11/2024)   Exercise Vital Sign    Days of Exercise per Week: Patient declined    Minutes of Exercise per Session: Not on file  Stress: Patient Declined (02/11/2024)   [employer] of Occupational Health - Occupational Stress Questionnaire    Feeling of Stress: Patient declined  Social Connections: Moderately Integrated (02/11/2024)   Social Connection and Isolation Panel    Frequency of Communication with Friends and Family: More than three times a week    Frequency of Social Gatherings with Friends and Family: Twice a week    Attends Religious Services: More than 4 times per year    Active Member of [employer] or Organizations: Yes    Attends Banker Meetings: More than 4 times per year    Marital Status: Widowed  Intimate Partner Violence: Not At Risk (09/03/2023)   Humiliation, Afraid, Rape, and Kick questionnaire    Fear of Current or Ex-Partner: No    Emotionally Abused: No    Physically Abused: No    Sexually Abused: No     Review of Systems  All other systems reviewed and are negative.      Objective:   Physical Exam Vitals reviewed.  Constitutional:      General: She is not in acute distress.    Appearance: Normal appearance. She is well-developed and normal weight. She is not ill-appearing, toxic-appearing or diaphoretic.  HENT:     Head: Normocephalic and atraumatic.     Right Ear: Tympanic membrane and ear canal  normal.     Left Ear: Tympanic membrane and ear canal normal.     Nose: Nose normal. No congestion or rhinorrhea.     Mouth/Throat:     Mouth: Mucous membranes are moist.     Pharynx: Oropharynx is clear. No oropharyngeal exudate or posterior oropharyngeal erythema.  Eyes:     Conjunctiva/sclera: Conjunctivae normal.     Pupils: Pupils are equal, round, and  reactive to light.  Neck:     Vascular: No carotid bruit.  Cardiovascular:     Rate and Rhythm: Normal rate and regular rhythm.     Pulses: Normal pulses.     Heart sounds: Murmur heard.     No friction rub. No gallop.  Pulmonary:     Effort: Pulmonary effort is normal. No respiratory distress.     Breath sounds: Normal breath sounds. No stridor. No wheezing, rhonchi or rales.  Chest:     Chest wall: No tenderness.  Abdominal:     General: Bowel sounds are normal. There is no distension.     Palpations: Abdomen is soft. There is no mass.     Tenderness: There is no abdominal tenderness. There is no guarding or rebound.     Hernia: No hernia is present.  Musculoskeletal:     Cervical back: Neck supple. No rigidity or tenderness.     Right lower leg: No edema.     Left lower leg: No edema.  Lymphadenopathy:     Cervical: No cervical adenopathy.  Skin:    Coloration: Skin is not jaundiced or pale.     Findings: No bruising, erythema, lesion or rash.  Neurological:     General: No focal deficit present.     Mental Status: She is alert and oriented to person, place, and time. Mental status is at baseline.     Cranial Nerves: No cranial nerve deficit.     Sensory: No sensory deficit.     Motor: No weakness or abnormal muscle tone.     Coordination: Coordination normal.     Gait: Gait normal.     Deep Tendon Reflexes: Reflexes are normal and symmetric. Reflexes normal.  Psychiatric:        Behavior: Behavior normal.        Thought Content: Thought content normal.        Judgment: Judgment normal.            Assessment & Plan:  Pure hypercholesterolemia - Plan: CBC with Differential/Platelet, Comprehensive metabolic panel with GFR, Lipid panel  Immunization due - Plan: Flu vaccine HIGH DOSE PF(Fluzone Trivalent)  Other fatigue - Plan: TSH  Mild intermittent asthma without complication - Plan: albuterol  (VENTOLIN  HFA) 108 (90 Base) MCG/ACT inhaler  HOCM (hypertrophic obstructive cardiomyopathy) (HCC)  Primary hypertension  General medical exam  Autoimmune hepatitis (HCC) Patient received her flu shot and her shingles vaccine today.  Colonoscopy is due next year.  She will schedule this with her gastroenterologist later this year.  Her blood pressure is excellent.  I recommended a bone density test next year.  She does not require Pap smear.  She does have a murmur on exam consistent with her hypertrophic cardiomyopathy but she denies any chest pain or shortness of breath or dyspnea on exertion or syncope or near syncope.  I will check a CBC, CMP, and a lipid panel.  She does endorse some fatigue.  Therefore I will check a TSH

## 2024-02-16 ENCOUNTER — Ambulatory Visit: Payer: Self-pay | Admitting: Family Medicine

## 2024-02-23 DIAGNOSIS — K754 Autoimmune hepatitis: Secondary | ICD-10-CM | POA: Diagnosis not present

## 2024-02-23 DIAGNOSIS — Z1211 Encounter for screening for malignant neoplasm of colon: Secondary | ICD-10-CM | POA: Diagnosis not present

## 2024-03-28 ENCOUNTER — Other Ambulatory Visit: Payer: Self-pay | Admitting: Family Medicine

## 2024-03-28 DIAGNOSIS — I421 Obstructive hypertrophic cardiomyopathy: Secondary | ICD-10-CM

## 2024-03-28 DIAGNOSIS — I10 Essential (primary) hypertension: Secondary | ICD-10-CM

## 2024-04-19 ENCOUNTER — Ambulatory Visit (INDEPENDENT_AMBULATORY_CARE_PROVIDER_SITE_OTHER)

## 2024-04-19 DIAGNOSIS — Z23 Encounter for immunization: Secondary | ICD-10-CM

## 2024-04-19 NOTE — Progress Notes (Signed)
 Patient is in office today for a nurse visit for Immunization. Patient Injection was given in the  Left deltoid. Patient tolerated injection well.

## 2024-05-03 ENCOUNTER — Encounter: Payer: Self-pay | Admitting: Family Medicine

## 2024-05-04 ENCOUNTER — Other Ambulatory Visit: Payer: Self-pay

## 2024-05-04 DIAGNOSIS — R5382 Chronic fatigue, unspecified: Secondary | ICD-10-CM

## 2024-05-04 DIAGNOSIS — F32A Depression, unspecified: Secondary | ICD-10-CM

## 2024-05-04 MED ORDER — ESCITALOPRAM OXALATE 10 MG PO TABS: 10.0000 mg | ORAL_TABLET | Freq: Every day | ORAL | 3 refills | Status: AC

## 2024-05-12 ENCOUNTER — Encounter: Payer: Self-pay | Admitting: Family Medicine

## 2024-05-12 ENCOUNTER — Other Ambulatory Visit: Payer: Self-pay

## 2024-05-12 DIAGNOSIS — I421 Obstructive hypertrophic cardiomyopathy: Secondary | ICD-10-CM

## 2024-05-12 DIAGNOSIS — I10 Essential (primary) hypertension: Secondary | ICD-10-CM

## 2024-05-12 MED ORDER — LEVOCETIRIZINE DIHYDROCHLORIDE 5 MG PO TABS: 5.0000 mg | ORAL_TABLET | Freq: Every evening | ORAL | 3 refills | Status: AC

## 2024-05-12 MED ORDER — VERAPAMIL HCL ER 240 MG PO TBCR: 240.0000 mg | EXTENDED_RELEASE_TABLET | Freq: Every day | ORAL | 2 refills | Status: AC

## 2024-05-12 MED ORDER — MELOXICAM 15 MG PO TABS: 15.0000 mg | ORAL_TABLET | Freq: Every day | ORAL | 1 refills | Status: AC

## 2024-08-15 ENCOUNTER — Ambulatory Visit: Admitting: Family Medicine

## 2024-09-08 ENCOUNTER — Encounter
